# Patient Record
Sex: Female | Born: 1940 | Race: Black or African American | Hispanic: No | Smoking: Never smoker
Health system: Southern US, Community
[De-identification: ages and names within clinical notes are randomized; demographics above are authoritative.]

## PROBLEM LIST (undated history)

## (undated) DIAGNOSIS — I639 Cerebral infarction, unspecified: Secondary | ICD-10-CM

## (undated) DIAGNOSIS — E785 Hyperlipidemia, unspecified: Secondary | ICD-10-CM

## (undated) DIAGNOSIS — G43909 Migraine, unspecified, not intractable, without status migrainosus: Secondary | ICD-10-CM

## (undated) DIAGNOSIS — R519 Headache, unspecified: Secondary | ICD-10-CM

## (undated) DIAGNOSIS — F329 Major depressive disorder, single episode, unspecified: Secondary | ICD-10-CM

## (undated) DIAGNOSIS — N289 Disorder of kidney and ureter, unspecified: Secondary | ICD-10-CM

## (undated) DIAGNOSIS — E079 Disorder of thyroid, unspecified: Secondary | ICD-10-CM

## (undated) DIAGNOSIS — R51 Headache: Secondary | ICD-10-CM

## (undated) DIAGNOSIS — I10 Essential (primary) hypertension: Secondary | ICD-10-CM

## (undated) DIAGNOSIS — F32A Depression, unspecified: Secondary | ICD-10-CM

## (undated) DIAGNOSIS — R55 Syncope and collapse: Secondary | ICD-10-CM

## (undated) DIAGNOSIS — N390 Urinary tract infection, site not specified: Secondary | ICD-10-CM

## (undated) HISTORY — DX: Migraine, unspecified, not intractable, without status migrainosus: G43.909

## (undated) HISTORY — DX: Hyperlipidemia, unspecified: E78.5

## (undated) HISTORY — DX: Major depressive disorder, single episode, unspecified: F32.9

## (undated) HISTORY — DX: Urinary tract infection, site not specified: N39.0

## (undated) HISTORY — DX: Headache: R51

## (undated) HISTORY — DX: Disorder of kidney and ureter, unspecified: N28.9

## (undated) HISTORY — DX: Depression, unspecified: F32.A

## (undated) HISTORY — DX: Essential (primary) hypertension: I10

## (undated) HISTORY — DX: Cerebral infarction, unspecified: I63.9

## (undated) HISTORY — DX: Headache, unspecified: R51.9

## (undated) HISTORY — DX: Syncope and collapse: R55

---

## 2012-11-08 ENCOUNTER — Emergency Department: Payer: Self-pay | Admitting: Emergency Medicine

## 2012-11-08 LAB — COMPREHENSIVE METABOLIC PANEL
Albumin: 3.9 g/dL (ref 3.4–5.0)
Anion Gap: 4 — ABNORMAL LOW (ref 7–16)
Bilirubin,Total: 0.9 mg/dL (ref 0.2–1.0)
Chloride: 103 mmol/L (ref 98–107)
Co2: 31 mmol/L (ref 21–32)
Creatinine: 0.83 mg/dL (ref 0.60–1.30)
EGFR (Non-African Amer.): >60
Glucose: 104 mg/dL — ABNORMAL HIGH (ref 65–99)
Osmolality: 274 (ref 275–301)
SGPT (ALT): 18 U/L (ref 12–78)
Total Protein: 8.2 g/dL (ref 6.4–8.2)

## 2012-11-08 LAB — URINALYSIS, COMPLETE
Nitrite: NEGATIVE
RBC,UR: 3 /HPF (ref 0–5)
Specific Gravity: 1.021 (ref 1.003–1.030)
Squamous Epithelial: <1

## 2012-11-08 LAB — CBC WITH DIFFERENTIAL/PLATELET
Basophil #: 0.1 10*3/uL (ref 0.0–0.1)
Basophil %: 0.7 %
Eosinophil #: 0.1 10*3/uL (ref 0.0–0.7)
Eosinophil %: 1.8 %
HCT: 44.4 % (ref 35.0–47.0)
Lymphocyte %: 20.4 %
MCH: 31.4 pg (ref 26.0–34.0)
MCHC: 33.9 g/dL (ref 32.0–36.0)
Monocyte #: 0.4 x10 3/mm (ref 0.2–0.9)
Neutrophil #: 5.3 10*3/uL (ref 1.4–6.5)
Neutrophil %: 71.3 %
Platelet: 197 10*3/uL (ref 150–440)
RBC: 4.81 10*6/uL (ref 3.80–5.20)
RDW: 13.1 % (ref 11.5–14.5)

## 2013-04-05 ENCOUNTER — Ambulatory Visit: Payer: Self-pay | Admitting: Obstetrics and Gynecology

## 2013-04-05 DIAGNOSIS — I10 Essential (primary) hypertension: Secondary | ICD-10-CM

## 2013-04-05 LAB — COMPREHENSIVE METABOLIC PANEL
Alkaline Phosphatase: 64 U/L
Anion Gap: 5 — ABNORMAL LOW (ref 7–16)
Bilirubin,Total: 1.4 mg/dL — ABNORMAL HIGH (ref 0.2–1.0)
Chloride: 102 mmol/L (ref 98–107)
Co2: 30 mmol/L (ref 21–32)
Potassium: 3.6 mmol/L (ref 3.5–5.1)
Sodium: 137 mmol/L (ref 136–145)

## 2013-04-05 LAB — CBC
HCT: 44.8 % (ref 35.0–47.0)
HGB: 14.8 g/dL (ref 12.0–16.0)
MCHC: 33.1 g/dL (ref 32.0–36.0)
MCV: 94 fL (ref 80–100)
Platelet: 168 10*3/uL (ref 150–440)

## 2013-04-16 ENCOUNTER — Ambulatory Visit: Payer: Self-pay | Admitting: Obstetrics and Gynecology

## 2013-04-19 LAB — PATHOLOGY REPORT

## 2014-08-06 NOTE — Op Note (Signed)
PATIENT NAME:  Kaylee [name], Kaylee [name] MR#:  [id number] DATE OF BIRTH:  Sep 17, 1940  DATE OF PROCEDURE:  04/16/2013  PREOPERATIVE DIAGNOSES: 1.  Postmenopausal bleeding.  2.  Cervical stenosis.   POSTOPERATIVE DIAGNOSES: 1.  Postmenopausal bleeding.  2.  Cervical stenosis.   PROCEDURES: 1.  Dilation and curettage.  2.  Hysteroscopy.   ANESTHESIA:  General.  SURGEON: Prentice Docker, M.D.   ESTIMATED BLOOD LOSS: 25 mL operative fluids: 600 mL crystalloid.   COMPLICATIONS: None.    FINDINGS: 1.  Normal-appearing uterine cavity.  2.  Small fundal lesion with normal shape and contour that was rounded and only slightly protruding. This was not able to be sampled after 4 passes with the curette; the features suggestive of leiomyoma.   SPECIMENS: Endometrial curettings.   CONDITION AT END OF  PROCEDURE: Stable.   PROCEDURE IN DETAIL: The patient was taken to the operating room where general anesthesia was administered and found to be adequate. She was placed in the dorsal supine lithotomy position in [place] stirrups and prepped and draped in the usual sterile fashion. After a timeout was called, a red rubber catheter was introduced into the bladder, and her bladder was drained of approximately 50 mL of clear urine. The red rubber catheter was removed. The cervix was attempted to be visualized with a standard speculum; however, there was a band in the distal vagina which did not allow for easy opening of the speculum to gain visualization and access to the cervix; therefore, manual right angle retractors were used. After multiple attempts of trying to grasp the anterior lip of the cervix without success, due to the cervix being very flush with the vaginal wall, packing forceps was used to grasp the posterior lip of the cervix and apply traction while the single-tooth tenaculum was applied. After this was accomplished, the cervix was dilated in a serial fashion using Hegar dilators to a dilatation of 8  mm. The hysteroscope was then gently introduced through the cervix with the above-noted findings. Several passes were attempted gently with the curette to obtain a comprehensive sample of the uterine cavity. Of note, there was the fundal lesion, which appeared very well circumscribed, only minimally protruding into the cavity and was not amenable to sampling, as it was quite firm and nodular and would not be sampled. After 4 attempts, the attempt at sampling was abandoned given the risk of harm versus benefit of sampling. 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 At this point, the procedure was terminated. All instrumentation was removed from the uterus and the cervix. The cervix was visualized and hemostasis was noted. Of note, the labia minora had to be suture tied so that lateral reflection was accomplished to gain better visualization. These sutures were removed at the end of procedure.   At the very end of the procedure, the vagina was inspected and no instruments or sponges were left in the vagina.   The patient tolerated the procedure well. Sponge, lap and needle counts were correct x 2. The patient was wearing pneumatic compression stockings for VTE prophylaxis throughout the entire procedure. She was awakened in the operating room and taken to the recovery area in stable condition.     ____________________________ Will Bonnet, MD sdj:dmm D: 04/16/2013 10:09:00 ET T: 04/16/2013 10:50:01 ET JOB#: [id number]  cc: Will Bonnet, MD, <Dictator> Will Bonnet MD ELECTRONICALLY SIGNED 05/21/2013 16:54

## 2018-02-05 ENCOUNTER — Emergency Department: Payer: Medicare Other

## 2018-02-05 ENCOUNTER — Inpatient Hospital Stay
Admission: EM | Admit: 2018-02-05 | Discharge: 2018-02-07 | DRG: 066 | Disposition: A | Discharge status: Home Health | Payer: Medicare Other | Attending: Internal Medicine | Admitting: Internal Medicine

## 2018-02-05 ENCOUNTER — Other Ambulatory Visit: Payer: Self-pay

## 2018-02-05 DIAGNOSIS — I6381 Other cerebral infarction due to occlusion or stenosis of small artery: Secondary | ICD-10-CM | POA: Diagnosis not present

## 2018-02-05 DIAGNOSIS — E039 Hypothyroidism, unspecified: Secondary | ICD-10-CM | POA: Diagnosis not present

## 2018-02-05 DIAGNOSIS — E785 Hyperlipidemia, unspecified: Secondary | ICD-10-CM | POA: Diagnosis present

## 2018-02-05 DIAGNOSIS — I10 Essential (primary) hypertension: Secondary | ICD-10-CM | POA: Diagnosis not present

## 2018-02-05 DIAGNOSIS — D696 Thrombocytopenia, unspecified: Secondary | ICD-10-CM | POA: Diagnosis present

## 2018-02-05 DIAGNOSIS — R930 Abnormal findings on diagnostic imaging of skull and head, not elsewhere classified: Secondary | ICD-10-CM | POA: Diagnosis not present

## 2018-02-05 DIAGNOSIS — I639 Cerebral infarction, unspecified: Secondary | ICD-10-CM

## 2018-02-05 DIAGNOSIS — M25552 Pain in left hip: Secondary | ICD-10-CM | POA: Diagnosis not present

## 2018-02-05 DIAGNOSIS — W19XXXA Unspecified fall, initial encounter: Secondary | ICD-10-CM

## 2018-02-05 DIAGNOSIS — S79912A Unspecified injury of left hip, initial encounter: Secondary | ICD-10-CM | POA: Diagnosis not present

## 2018-02-05 DIAGNOSIS — R9389 Abnormal findings on diagnostic imaging of other specified body structures: Secondary | ICD-10-CM

## 2018-02-05 DIAGNOSIS — S0990XA Unspecified injury of head, initial encounter: Secondary | ICD-10-CM | POA: Diagnosis not present

## 2018-02-05 DIAGNOSIS — R42 Dizziness and giddiness: Secondary | ICD-10-CM | POA: Diagnosis not present

## 2018-02-05 DIAGNOSIS — R55 Syncope and collapse: Secondary | ICD-10-CM | POA: Diagnosis not present

## 2018-02-05 DIAGNOSIS — R4182 Altered mental status, unspecified: Secondary | ICD-10-CM | POA: Diagnosis not present

## 2018-02-05 DIAGNOSIS — R29700 NIHSS score 0: Secondary | ICD-10-CM | POA: Diagnosis not present

## 2018-02-05 HISTORY — DX: Disorder of thyroid, unspecified: E07.9

## 2018-02-05 LAB — BASIC METABOLIC PANEL
Anion gap: 8 (ref 5–15)
BUN: 19 mg/dL (ref 8–23)
CALCIUM: 9.3 mg/dL (ref 8.9–10.3)
CO2: 26 mmol/L (ref 22–32)
CREATININE: 1 mg/dL (ref 0.44–1.00)
Chloride: 105 mmol/L (ref 98–111)
GFR calc non Af Amer: 52 mL/min — ABNORMAL LOW (ref >60)
Glucose, Bld: 146 mg/dL — ABNORMAL HIGH (ref 70–99)
Potassium: 4 mmol/L (ref 3.5–5.1)
SODIUM: 139 mmol/L (ref 135–145)

## 2018-02-05 LAB — URINALYSIS, COMPLETE (UACMP) WITH MICROSCOPIC
BILIRUBIN URINE: NEGATIVE
Bacteria, UA: NONE SEEN
Glucose, UA: NEGATIVE mg/dL
Ketones, ur: 20 mg/dL — AB
LEUKOCYTES UA: NEGATIVE
Nitrite: NEGATIVE
PH: 7 (ref 5.0–8.0)
Protein, ur: NEGATIVE mg/dL
SPECIFIC GRAVITY, URINE: 1.015 (ref 1.005–1.030)

## 2018-02-05 LAB — TSH: TSH: 5.077 u[IU]/mL — ABNORMAL HIGH (ref 0.350–4.500)

## 2018-02-05 LAB — CBC
HCT: 41.4 % (ref 36.0–46.0)
Hemoglobin: 13.6 g/dL (ref 12.0–15.0)
MCH: 31.5 pg (ref 26.0–34.0)
MCHC: 32.9 g/dL (ref 30.0–36.0)
MCV: 95.8 fL (ref 80.0–100.0)
NRBC: 0 % (ref 0.0–0.2)
PLATELETS: 117 10*3/uL — AB (ref 150–400)
RBC: 4.32 MIL/uL (ref 3.87–5.11)
RDW: 12.4 % (ref 11.5–15.5)
WBC: 6.7 10*3/uL (ref 4.0–10.5)

## 2018-02-05 LAB — TROPONIN I: Troponin I: <0.03 ng/mL (ref <0.03)

## 2018-02-05 MED ORDER — ENOXAPARIN SODIUM 40 MG/0.4ML ~~LOC~~ SOLN
40.0000 mg | SUBCUTANEOUS | Status: DC
  Administered 2018-02-05 – 2018-02-06 (×2): 40 mg via SUBCUTANEOUS
  Filled 2018-02-05 (×2): qty 0.4

## 2018-02-05 MED ORDER — ONDANSETRON HCL 4 MG/2ML IJ SOLN: 4.0000 mg | Freq: Four times a day (QID) | INTRAMUSCULAR | Status: DC | PRN

## 2018-02-05 MED ORDER — ADULT MULTIVITAMIN W/MINERALS CH
1.0000 | ORAL_TABLET | ORAL | Status: DC
  Administered 2018-02-05: 1 via ORAL
  Filled 2018-02-05: qty 1

## 2018-02-05 MED ORDER — ONDANSETRON HCL 4 MG PO TABS: 4.0000 mg | ORAL_TABLET | Freq: Four times a day (QID) | ORAL | Status: DC | PRN

## 2018-02-05 MED ORDER — HYDRALAZINE HCL 20 MG/ML IJ SOLN: 10.0000 mg | Freq: Four times a day (QID) | INTRAMUSCULAR | Status: DC | PRN

## 2018-02-05 MED ORDER — ACETAMINOPHEN 650 MG RE SUPP: 650.0000 mg | Freq: Four times a day (QID) | RECTAL | Status: DC | PRN

## 2018-02-05 MED ORDER — ASPIRIN EC 81 MG PO TBEC
81.0000 mg | DELAYED_RELEASE_TABLET | Freq: Every day | ORAL | Status: DC
  Administered 2018-02-05 – 2018-02-07 (×3): 81 mg via ORAL
  Filled 2018-02-05 (×3): qty 1

## 2018-02-05 MED ORDER — STROKE: EARLY STAGES OF RECOVERY BOOK
Freq: Once | Status: AC
  Administered 2018-02-05: 21:00:00

## 2018-02-05 MED ORDER — ACETAMINOPHEN 325 MG PO TABS: 650.0000 mg | ORAL_TABLET | Freq: Four times a day (QID) | ORAL | Status: DC | PRN

## 2018-02-05 NOTE — H&P (Signed)
[facility] [place] NAME: Haeleigh Streiff    MR#:  [id number]  DATE OF BIRTH:  November 11, 1940  DATE OF ADMISSION:  02/05/2018  PRIMARY CARE PHYSICIAN: Patient, No Pcp Per   REQUESTING/REFERRING PHYSICIAN: Dr. Charlotte Crumb  CHIEF COMPLAINT:   Chief Complaint  Patient presents with  . Loss of Consciousness    HISTORY OF PRESENT ILLNESS:  Ileanna Gemmill  is a 77 y.o. female with a known history of hypothyroidism, hypertension but has not seen a doctor in a while and currently on no medications who presents to the hospital due to syncope.  Patient herself cannot recall the events to for most of the history obtained from the family at bedside.  As per the family patient was in her usual state of health and was eating lunch just fine and when she got up after finishing her lunch she fell backwards with her eyes rolling backwards and hit her head.  She was only out for a few seconds and then was arousable and was talking.  Patient had no vigorous shaking or any incontinence.  She has no previous history of seizures.  Patient cannot recall any of the events and therefore was brought to the hospital for further evaluation.  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  Hospitalist services were contacted for admission.  Patient presently denies any chest pains, shortness of breath, nausea, vomiting, numbness, tingling or any focal weakness.  PAST MEDICAL HISTORY:   Past Medical History:  Diagnosis Date  . Thyroid disease     PAST SURGICAL HISTORY:  The histories are not reviewed yet. Please review them in the "History" navigator section and refresh this [place].  SOCIAL HISTORY:   Social History   Tobacco Use  . Smoking status: Never Smoker  . Smokeless tobacco: Never Used  Substance Use Topics  . Alcohol use: Not  Currently    FAMILY HISTORY:   Family History  Problem Relation Age of Onset  . Dementia Mother   . Hypertension Mother   . Hypothyroidism Mother   . Prostate cancer Father     DRUG ALLERGIES:  No Known Allergies  REVIEW OF SYSTEMS:   Review of Systems  Constitutional: Negative for fever and weight loss.  HENT: Negative for congestion, nosebleeds and tinnitus.   Eyes: Negative for blurred vision, double vision and redness.  Respiratory: Negative for cough, hemoptysis and shortness of breath.   Cardiovascular: Negative for chest pain, orthopnea, leg swelling and PND.       Syncope   Gastrointestinal: Negative for abdominal pain, diarrhea, melena, nausea and vomiting.  Genitourinary: Negative for dysuria, hematuria and urgency.  Musculoskeletal: Negative for falls and joint pain.  Neurological: Negative for dizziness, tingling, sensory change, focal weakness, seizures, weakness and headaches.  Endo/Heme/Allergies: Negative for polydipsia. Does not bruise/bleed easily.  Psychiatric/Behavioral: Negative for depression and memory loss. The patient is not nervous/anxious.     MEDICATIONS AT HOME:   Prior to Admission medications   Medication Sig Start Date End Date Taking? Authorizing Provider  Multiple Vitamin (MULTIVITAMIN) tablet Take 1 tablet by mouth once a week.   Yes [provider]      VITAL SIGNS:  Blood pressure (!) 165/88, pulse 68, temperature 97.9 F (36.6 C), temperature source Oral, resp. rate 17, height 5\' 5"  (1.651 m),  weight 61.6 kg, SpO2 100 %.  PHYSICAL EXAMINATION:  Physical Exam  GENERAL:  77 y.o.-year-old patient lying in the bed in no acute distress.  EYES: Pupils equal, round, reactive to light and accommodation. No scleral icterus. Extraocular muscles intact.  HEENT: Head atraumatic, normocephalic. Oropharynx and nasopharynx clear. No oropharyngeal erythema, moist oral mucosa  NECK:  Supple, no jugular venous distention. No thyroid  enlargement, no tenderness.  LUNGS: Normal breath sounds bilaterally, no wheezing, rales, rhonchi. No use of accessory muscles of respiration.  CARDIOVASCULAR: S1, S2 RRR. No murmurs, rubs, gallops, clicks.  ABDOMEN: Soft, nontender, nondistended. Bowel sounds present. No organomegaly or mass.  EXTREMITIES: No pedal edema, cyanosis, or clubbing. + 2 pedal & radial pulses b/l.   NEUROLOGIC: Cranial nerves II through XII are intact. No focal Motor or sensory deficits appreciated b/l PSYCHIATRIC: The patient is alert and oriented x 3.  SKIN: No obvious rash, lesion, or ulcer.   LABORATORY PANEL:   CBC Recent Labs  Lab 02/05/18 1412  WBC 6.7  HGB 13.6  HCT 41.4  PLT 117*   ------------------------------------------------------------------------------------------------------------------  Chemistries  Recent Labs  Lab 02/05/18 1412  NA 139  K 4.0  CL 105  CO2 26  GLUCOSE 146*  BUN 19  CREATININE 1.00  CALCIUM 9.3   ------------------------------------------------------------------------------------------------------------------  Cardiac Enzymes Recent Labs  Lab 02/05/18 1421  TROPONINI <0.03   ------------------------------------------------------------------------------------------------------------------  RADIOLOGY:  Ct Head Wo Contrast  Result Date: 02/05/2018 CLINICAL DATA:  Patient passed out and hit back of head.  No pain. EXAM: CT HEAD WITHOUT CONTRAST TECHNIQUE: Contiguous axial images were obtained from the base of the skull through the vertex without intravenous contrast. COMPARISON:  None. FINDINGS: Brain: No subdural, epidural, or subarachnoid hemorrhage. There is a lacunar infarct in the left cerebellar hemisphere. Cerebellum, brainstem, and basal cisterns are otherwise normal. Ventricles and sulci are prominent, consistent with volume loss. White matter changes noted. An infarct in the left frontal lobe is suspected to be nonacute. No other evidence of acute  ischemia infarct. No mass effect or midline shift. Vascular: No hyperdense vessel or unexpected calcification. Skull: Normal. Negative for fracture or focal lesion. Sinuses/Orbits: No acute finding. Other: None. IMPRESSION: 1. 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 Chronic left cerebellar hemisphere lacunar infarct. No other evidence of acute abnormality. Electronically Signed   By: Dorise Bullion III M.D   On: 02/05/2018 16:20   Dg Hip Unilat W Or Wo Pelvis 2-3 Views Left  Result Date: 02/05/2018 CLINICAL DATA:  Acute LEFT hip pain following fall today. Initial encounter. EXAM: DG HIP (WITH OR WITHOUT PELVIS) 2-3V LEFT COMPARISON:  11/08/2012 pelvic CT scout film FINDINGS: No acute fracture or dislocation. No focal bony lesions are identified. The joint spaces are unremarkable. Degenerative changes in the LOWER lumbar spine are noted. IMPRESSION: No acute bony abnormality. Electronically Signed   By: Margarette Canada M.D.   On: 02/05/2018 16:39     IMPRESSION AND PLAN:   77 year old female with past medical history of hypothyroidism, hypertension but currently on no medications and has not seen a physician in a few years who presents to the hospital after a syncopal episode and noted to have CT scan findings suggestive of a acute/subacute CVA.  1.  Acute/subacute CVA- this was noted on a CT head on admission.  Patient initially presented with a syncopal episode and fall. - Patient has no previous history of CVA.  She has no focal neurological deficits. - We will start on  baby aspirin, check a lipid profile.  Will get MRI of the brain, check carotid duplex, echocardiogram.  We will also get a neurology consult. - We will get physical therapy and speech evaluation.    2.  Syncope- etiology unclear.  Will observe on telemetry, await echocardiogram and carotid duplex results.  Will check orthostatic vital signs.  3.  Essential hypertension-patient currently on no medications. - We will  place on IV hydralazine as needed for now.  4.  Hypothyroidism-we will check a TSH.  Patient currently on no meds.    All the records are reviewed and case discussed with ED provider. Management plans discussed with the patient, family and they are in agreement.  CODE STATUS: Full code  TOTAL TIME TAKING CARE OF THIS PATIENT: 45 minutes.    Henreitta Leber M.D on 02/05/2018 at 5:56 PM  Between 7am to 6pm - Pager - [phone]  After 6pm go to [email or web address] - password [email or web address] [facility]  Office  [phone]  CC: Primary care physician; Patient, No Pcp Per

## 2018-02-05 NOTE — ED Provider Notes (Addendum)
[facility] Emergency Department Provider Note  ____________________________________________   I have reviewed the triage vital signs and the nursing notes. Where available I have reviewed prior notes and, if possible and indicated, outside hospital notes.    HISTORY  Chief Complaint Loss of Consciousness    HPI Kaylee [name] is a 77 y.o. female  presents today complaining of nothing but was noted to have had a fall. Family states she got up from lunch very rapidly, got lightheaded, stumbled and then fell bumping her head. She did not pass out there was no seizure activity, no incontinence of bowel or bladder, patient states "I just fell". She denies any fever or chills or antecedent chest pain or shortness of breath or other concerns. She states she feels "pretty good". Family are concerned that she may have for her hip, patient states that she did not. level V chart caveat, limited H&P given patient dementia    Past Medical History:  Diagnosis Date  . Thyroid disease     There are no active problems to display for this patient.     Prior to Admission medications   Not on File    Allergies Patient has no known allergies.  No family history on file.  Social History Social History   Tobacco Use  . Smoking status: Never Smoker  . Smokeless tobacco: Never Used  Substance Use Topics  . Alcohol use: Not Currently  . Drug use: Not Currently    Review of Systems Constitutional: No fever/chills Eyes: No visual changes. ENT: No sore throat. No stiff neck no neck pain Cardiovascular: Denies chest pain. Respiratory: Denies shortness of breath. Gastrointestinal:   no vomiting.  No diarrhea.  No constipation. Genitourinary: Negative for dysuria. Musculoskeletal: Negative lower extremity swelling Skin: Negative for rash. Neurological: Negative for severe headaches, focal weakness or  numbness.   ____________________________________________   PHYSICAL EXAM:  VITAL SIGNS: ED Triage Vitals  Enc Vitals Group     BP 02/05/18 1407 (!) 165/88     Pulse Rate 02/05/18 1407 68     Resp 02/05/18 1407 17     Temp 02/05/18 1407 97.9 F (36.6 C)     Temp Source 02/05/18 1407 Oral     SpO2 02/05/18 1412 100 %     Weight 02/05/18 1409 135 lb 12.9 oz (61.6 kg)     Height 02/05/18 1409 5\' 5"  (1.651 m)     Head Circumference --      Peak Flow --      Pain Score 02/05/18 1409 0     Pain Loc --      Pain Edu? --      Excl. in [place]? --     Constitutional: Alert and orientedto name unsure of the date answers questions appropriately is intermittently compliant with exam. Well appearing and in no acute distress. Eyes: Conjunctivae are normal Head: Atraumatic HEENT: No congestion/rhinnorhea. Mucous membranes are moist.  Oropharynx non-erythematous Neck:   Nontender with no meningismus, no masses, no stridor Cardiovascular: Normal rate, regular rhythm. Grossly normal heart sounds.  Good peripheral circulation. Respiratory: Normal respiratory effort.  No retractions. Lungs CTAB. Abdominal: Soft and nontender. No distention. No guarding no rebound Back:  There is no focal tenderness or step off.  there is no midline tenderness there are no lesions noted. there is no CVA tenderness Musculoskeletal: slight tenderness to the left hip., no upper extremity tenderness. No joint effusions, no DVT signs strong distal pulses no edema Neurologic:  Normal speech and language. No gross focal neurologic deficits are appreciated.  Skin:  Skin is warm, dry and intact. No rash noted. Psychiatric: Mood and affect are normal. Speech and behavior are normal.  ____________________________________________   LABS (all labs ordered are listed, but only abnormal results are displayed)  Labs Reviewed  BASIC METABOLIC PANEL - Abnormal; Notable for the following components:      Result Value   Glucose,  Bld 146 (*)    GFR calc non Af Amer 52 (*)    All other components within normal limits  CBC - Abnormal; Notable for the following components:   Platelets 117 (*)    All other components within normal limits  URINALYSIS, COMPLETE (UACMP) WITH MICROSCOPIC - Abnormal; Notable for the following components:   Color, Urine YELLOW (*)    APPearance CLEAR (*)    Hgb urine dipstick SMALL (*)    Ketones, ur 20 (*)    All other components within normal limits  CBG MONITORING, ED    Pertinent labs  results that were available during my care of the patient were reviewed by me and considered in my medical decision making (see chart for details). ____________________________________________  EKG  I personally interpreted any EKGs ordered by me or triage sinus rhythm at 70 bpm no acute ST elevation or depression normal axis and remarkable EKG ____________________________________________  RADIOLOGY  Pertinent labs & imaging results that were available during my care of the patient were reviewed by me and considered in my medical decision making (see chart for details). If possible, patient and/or family made aware of any abnormal findings.  No results found. ____________________________________________    PROCEDURES  Procedure(s) performed: None  Procedures  Critical Care performed: None  ____________________________________________   INITIAL IMPRESSION / ASSESSMENT AND PLAN / ED COURSE  Pertinent labs & imaging results that were available during my care of the patient were reviewed by me and considered in my medical decision making (see chart for details).  seen here after a stumble and fall event, she has no complaints.patient is in no acute distress, has no evidence of acute toxidrome,no evidence of ACS PE dissection or seizure. her blood work is reassuring there was no syncope, we will obtain CT head andreassess closely. Patient in no acute distress. There is some concern about  her hip which we will x-ray.  ----------------------------------------- 4:59 PM on 02/05/2018 -----------------------------------------  I personally evaluated the patient's CT scan and discussed it with Dr. Toney Reil, the neuroradiologist, who feels that these  or subacute CVAs, most likely happening within the last week. We will admit the patient for further evaluation.   ____________________________________________   FINAL CLINICAL IMPRESSION(S) / ED DIAGNOSES  Final diagnoses:  None      This chart was dictated using voice recognition software.  Despite best efforts to proofread,  errors can occur which can change meaning.      Schuyler Amor, MD 02/05/18 1608    Schuyler Amor, MD 02/05/18 1700

## 2018-02-05 NOTE — ED Triage Notes (Signed)
Pt comes into the ED via EMS from home with c/o pt standing from a sitting position and passing out, falling back and hitting the back of her head. Pt denies any pain. Pt has some confusion and alert to self only.

## 2018-02-06 ENCOUNTER — Observation Stay (HOSPITAL_COMMUNITY)
Admit: 2018-02-06 | Discharge: 2018-02-06 | Disposition: A | Discharge status: Home / Self Care | Payer: Medicare Other | Attending: Specialist | Admitting: Specialist

## 2018-02-06 ENCOUNTER — Observation Stay: Payer: Medicare Other

## 2018-02-06 ENCOUNTER — Other Ambulatory Visit: Payer: Self-pay | Admitting: Nurse Practitioner

## 2018-02-06 ENCOUNTER — Encounter: Payer: Self-pay | Admitting: Nurse Practitioner

## 2018-02-06 DIAGNOSIS — R4182 Altered mental status, unspecified: Secondary | ICD-10-CM | POA: Diagnosis not present

## 2018-02-06 DIAGNOSIS — I63423 Cerebral infarction due to embolism of bilateral anterior cerebral arteries: Secondary | ICD-10-CM | POA: Diagnosis not present

## 2018-02-06 DIAGNOSIS — R29700 NIHSS score 0: Secondary | ICD-10-CM | POA: Diagnosis present

## 2018-02-06 DIAGNOSIS — I5030 Unspecified diastolic (congestive) heart failure: Secondary | ICD-10-CM

## 2018-02-06 DIAGNOSIS — I10 Essential (primary) hypertension: Secondary | ICD-10-CM | POA: Diagnosis not present

## 2018-02-06 DIAGNOSIS — D696 Thrombocytopenia, unspecified: Secondary | ICD-10-CM | POA: Diagnosis present

## 2018-02-06 DIAGNOSIS — R55 Syncope and collapse: Secondary | ICD-10-CM | POA: Diagnosis not present

## 2018-02-06 DIAGNOSIS — E785 Hyperlipidemia, unspecified: Secondary | ICD-10-CM | POA: Diagnosis present

## 2018-02-06 DIAGNOSIS — I63232 Cerebral infarction due to unspecified occlusion or stenosis of left carotid arteries: Secondary | ICD-10-CM | POA: Diagnosis not present

## 2018-02-06 DIAGNOSIS — I6381 Other cerebral infarction due to occlusion or stenosis of small artery: Secondary | ICD-10-CM | POA: Diagnosis present

## 2018-02-06 DIAGNOSIS — E039 Hypothyroidism, unspecified: Secondary | ICD-10-CM | POA: Diagnosis present

## 2018-02-06 DIAGNOSIS — I639 Cerebral infarction, unspecified: Secondary | ICD-10-CM | POA: Diagnosis not present

## 2018-02-06 LAB — HEMOGLOBIN A1C
HEMOGLOBIN A1C: 5.2 % (ref 4.8–5.6)
Mean Plasma Glucose: 102.54 mg/dL

## 2018-02-06 LAB — ECHOCARDIOGRAM COMPLETE
Height: 65 in
WEIGHTICAEL: 2195.2 [oz_av]

## 2018-02-06 LAB — LIPID PANEL
Cholesterol: 173 mg/dL (ref 0–200)
HDL: 63 mg/dL (ref >40)
LDL CALC: 103 mg/dL — AB (ref 0–99)
Total CHOL/HDL Ratio: 2.7 RATIO
Triglycerides: 33 mg/dL (ref <150)
VLDL: 7 mg/dL (ref 0–40)

## 2018-02-06 MED ORDER — ATORVASTATIN CALCIUM 20 MG PO TABS
40.0000 mg | ORAL_TABLET | Freq: Every day | ORAL | Status: DC
  Administered 2018-02-06: 18:00:00 40 mg via ORAL
  Filled 2018-02-06: qty 2

## 2018-02-06 MED ORDER — LEVETIRACETAM 500 MG PO TABS
500.0000 mg | ORAL_TABLET | Freq: Two times a day (BID) | ORAL | Status: DC
  Filled 2018-02-06 (×2): qty 1

## 2018-02-06 MED ORDER — LORAZEPAM 2 MG/ML IJ SOLN
1.0000 mg | Freq: Once | INTRAMUSCULAR | Status: AC
  Administered 2018-02-06: 11:00:00 1 mg via INTRAVENOUS
  Filled 2018-02-06 (×2): qty 1

## 2018-02-06 NOTE — Progress Notes (Signed)
Advanced care plan.  Purpose of the Encounter: CODE STATUS  Parties in Attendance: Patient herself  Patient's Decision Capacity: Intact  Subjective/Patient's story: Patient 77 year old admitted with syncope and a stroke   Objective/Medical story  I discussed with the patient regarding her desires for cardiac and pulmonary resuscitation.  As well as her wishes for future interventions  Goals of care determination:  She wishes to be a full code   CODE STATUS: Full code   Time spent discussing advanced care planning: 16 minutes

## 2018-02-06 NOTE — Consult Note (Addendum)
Referring Physician: Henreitta Leber, MD    Chief Complaint: Loss of consciousness  HPI: Kaylee [name] is an 77 y.o. female with history of syncopal episode, hypertension, and thyroid dysfunction presenting to the ED with syncopal episode. Per patient's son who is at the bedside and provides history, patient was having lunch with her sister around 12-1 pm yesterday when the episode occurred.  Patient's son state that, patient stood up from the table then suddenly felt lightheaded and was stumbling around prior to falling on the floor with brief loss of consciousness.  Patient's son reports that patient eyes were rolling backwards without notable myoclonic jerking movement.  Patient denied associated symptoms preceding the event of nausea and vomiting, feeling cold or clammy, visual auras or blurry vision, palpitations shortness of breath or chest pain.  Following the event she was confused but without Injury or loss of bladder or bowel incontinence noted.  Patient states she does not recall the events following the fall, all she can remember was feeling lightheaded and falling on the floor.  Patient's son states that she has not been taking her medication for high blood pressure and thyroid dysfunction. Patient was also not on any antiplatelet or anticoagulation prior to this episode.  Initial CT head showed left frontal lobe infarct.  Initial NIH stroke scale was 0.  Date last known well: Date: 02/05/2018 Time last known well: Time: 12:00 tPA Given: No: NIH stroke scale of 0  Past Medical History:  Diagnosis Date  . Thyroid disease     Family History  Problem Relation Age of Onset  . Dementia Mother   . Hypertension Mother   . Hypothyroidism Mother   . Prostate cancer Father    Social History:  reports that she has never smoked. She has never used smokeless tobacco. She reports that she drank alcohol. She reports that she has current or past drug history.  Allergies: No Known  Allergies  Medications:  I have reviewed the patient's current medications. Prior to Admission:  Medications Prior to Admission  Medication Sig Dispense Refill Last Dose  . Multiple Vitamin (MULTIVITAMIN) tablet Take 1 tablet by mouth once a week.   Past Week at 0800   Scheduled: . aspirin EC  81 mg Oral Daily  . atorvastatin  40 mg Oral q1800  . enoxaparin (LOVENOX) injection  40 mg Subcutaneous Q24H  . multivitamin with minerals  1 tablet Oral Weekly    ROS: History obtained from the patient   General ROS: negative for - chills, fatigue, fever, night sweats, weight gain or weight loss Psychological ROS: negative for - behavioral disorder, hallucinations, memory difficulties, mood swings or suicidal ideation Ophthalmic ROS: negative for - blurry vision, double vision, eye pain or loss of vision ENT ROS: negative for - epistaxis, nasal discharge, oral lesions, sore throat, tinnitus or vertigo Allergy and Immunology ROS: negative for - hives or itchy/watery eyes Hematological and Lymphatic ROS: negative for - bleeding problems, bruising or swollen lymph nodes Endocrine ROS: negative for - galactorrhea, hair pattern changes, polydipsia/polyuria or temperature intolerance Respiratory ROS: negative for - cough, hemoptysis, shortness of breath or wheezing Cardiovascular ROS: negative for - chest pain, dyspnea on exertion, edema or irregular heartbeat Gastrointestinal ROS: negative for - abdominal pain, diarrhea, hematemesis, nausea/vomiting or stool incontinence Genito-Urinary ROS: negative for - dysuria, hematuria, incontinence or urinary frequency/urgency Musculoskeletal ROS: negative for - joint swelling or muscular weakness Neurological ROS: as noted in HPI Dermatological ROS: negative for rash and skin lesion  changes  Physical Examination: Blood pressure (!) 146/86, pulse 68, temperature 98.8 F (37.1 C), temperature source Oral, resp. rate 16, height 5\' 5"  (1.651 m), weight 62.2  kg, SpO2 98 %.   HEENT-  Normocephalic, no lesions, without obvious abnormality.  Normal external eye and conjunctiva.  Normal TM's bilaterally.  Normal auditory canals and external ears. Normal external nose, mucus membranes and septum.  Normal pharynx. Cardiovascular- S1, S2 normal, pulses palpable throughout   Lungs- chest clear, no wheezing, rales, normal symmetric air entry Abdomen- soft, non-tender; bowel sounds normal; no masses,  no organomegaly Extremities- no edema Lymph-no adenopathy palpable Musculoskeletal-no joint tenderness, deformity or swelling Skin-warm and dry, no hyperpigmentation, vitiligo, or suspicious lesions  Neurological Exam   Mental Status: Alert, oriented, thought content appropriate.  Speech fluent without evidence of aphasia.  Able to follow 3 step commands without difficulty. Attention span and concentration seemed appropriate  Cranial Nerves: II: Discs flat bilaterally; Visual fields grossly normal, pupils equal, round, reactive to light and accommodation III,IV, VI: ptosis not present, extra-ocular motions intact bilaterally V,VII: smile symmetric, facial light touch sensation intact VIII: hearing normal bilaterally IX,X: gag reflex present XI: bilateral shoulder shrug XII: midline tongue extension Motor: Right :  Upper extremity   5/5 Without pronator drift      Left: Upper extremity   5/5 without pronator drift Right:   Lower extremity   5/5                                          Left: Lower extremity   5/5 Tone and bulk:normal tone throughout; no atrophy noted Sensory: Pinprick and light touch intact bilaterally Deep Tendon Reflexes: 2+ and symmetric throughout Plantars: Right: mute                              Left: mute Cerebellar: Finger-to-nose testing intact bilaterally. Heel to shin testing normal bilaterally Gait: not tested due to safety concerns  Data Reviewed  Laboratory Studies:  Basic Metabolic Panel: Recent Labs  Lab  02/05/18 1412  NA 139  K 4.0  CL 105  CO2 26  GLUCOSE 146*  BUN 19  CREATININE 1.00  CALCIUM 9.3    Liver Function Tests: No results for input(s): AST, ALT, ALKPHOS, BILITOT, PROT, ALBUMIN in the last 168 hours. No results for input(s): LIPASE, AMYLASE in the last 168 hours. No results for input(s): AMMONIA in the last 168 hours.  CBC: Recent Labs  Lab 02/05/18 1412  WBC 6.7  HGB 13.6  HCT 41.4  MCV 95.8  PLT 117*    Cardiac Enzymes: Recent Labs  Lab 02/05/18 1421  TROPONINI <0.03    BNP: Invalid input(s): POCBNP  CBG: No results for input(s): GLUCAP in the last 168 hours.  Microbiology: No results found for this or any previous visit.  Coagulation Studies: No results for input(s): LABPROT, INR in the last 72 hours.  Urinalysis:  Recent Labs  Lab 02/05/18 1515  COLORURINE YELLOW*  LABSPEC 1.015  PHURINE 7.0  GLUCOSEU NEGATIVE  HGBUR SMALL*  BILIRUBINUR NEGATIVE  KETONESUR 20*  PROTEINUR NEGATIVE  NITRITE NEGATIVE  LEUKOCYTESUR NEGATIVE    Lipid Panel:    Component Value Date/Time   CHOL 173 02/06/2018 0505   TRIG 33 02/06/2018 0505   HDL 63 02/06/2018 0505   CHOLHDL 2.7 02/06/2018 0505  VLDL 7 02/06/2018 0505   LDLCALC 103 (H) 02/06/2018 0505    HgbA1C:  Lab Results  Component Value Date   HGBA1C 5.2 02/06/2018    Urine Drug Screen:  No results found for: LABOPIA, COCAINSCRNUR, LABBENZ, AMPHETMU, THCU, LABBARB  Alcohol Level: No results for input(s): ETH in the last 168 hours.  Other results: EKG: normal EKG, normal sinus rhythm, unchanged from previous tracings.  Imaging: Ct Head Wo Contrast  Result Date: 02/05/2018 CLINICAL DATA:  Patient passed out and hit back of head.  No pain. EXAM: CT HEAD WITHOUT CONTRAST TECHNIQUE: Contiguous axial images were obtained from the base of the skull through the vertex without intravenous contrast. COMPARISON:  None. FINDINGS: Brain: No subdural, epidural, or subarachnoid hemorrhage. There  is a lacunar infarct in the left cerebellar hemisphere. Cerebellum, brainstem, and basal cisterns are otherwise normal. Ventricles and sulci are prominent, consistent with volume loss. White matter changes noted. An infarct in the left frontal lobe is suspected to be nonacute. No other evidence of acute ischemia infarct. No mass effect or midline shift. Vascular: No hyperdense vessel or unexpected calcification. Skull: Normal. Negative for fracture or focal lesion. Sinuses/Orbits: No acute finding. Other: None. IMPRESSION: 1. 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 Chronic left cerebellar hemisphere lacunar infarct. No other evidence of acute abnormality. Electronically Signed   By: Dorise Bullion III M.D   On: 02/05/2018 16:20   [name] Head Wo Contrast  Result Date: 02/06/2018 CLINICAL DATA:  Syncope. EXAM: MRI HEAD WITHOUT CONTRAST MRA HEAD WITHOUT CONTRAST TECHNIQUE: Multiplanar, multiecho pulse sequences of the brain and surrounding structures were obtained without intravenous contrast. Angiographic images of the head were obtained using MRA technique without contrast. COMPARISON:  Head CT 02/05/2018 FINDINGS: MRI HEAD FINDINGS The study is mildly motion degraded. Brain: There punctate acute infarcts in the left middle frontal gyrus and both occipital lobes. Chronic infarcts are noted in the left frontal lobe and right parietal lobe with a small amount of chronic blood products. Chronic infarcts are present in both cerebellar hemispheres. Patchy cerebral white matter T2 hyperintensities separate from the aforementioned chronic infarcts are nonspecific but compatible with moderate chronic small vessel ischemic disease. There is mild cerebral atrophy. No mass, midline shift, or extra-axial fluid collection is identified. 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 Vascular: Major intracranial vascular flow voids  are preserved. Skull and upper cervical spine: Unremarkable bone marrow signal. Sinuses/Orbits: Unremarkable orbits. Paranasal sinuses and mastoid air cells are clear. Other: None. MRA HEAD FINDINGS The study is moderately motion degraded. The visualized distal vertebral arteries are patent to the basilar and codominant. The basilar artery is patent without evidence of significant stenosis allowing for motion artifact proximally. Patent SCA origins are identified bilaterally. There is a small right posterior communicating artery. The PCAs are patent with asymmetric attenuation of distal branch vessels on the left but no evidence of significant proximal stenosis. The internal carotid arteries are patent from skull base to carotid termini with evaluation for stenosis limited by motion artifact. ACAs and MCAs are patent without evidence of significant A1 or M1 stenosis. Branch vessel evaluation is limited by motion artifact, however no proximal branch occlusion is identified. No aneurysm is identified within limitations of motion. IMPRESSION: 1. Punctate acute left frontal and bilateral occipital infarcts. 2. Chronic left frontal, right parietal, and bilateral cerebellar infarcts. 3. Moderate chronic small vessel ischemic disease. 4. Motion degraded  head MRA without major branch occlusion. Electronically Signed   By: Logan Bores M.D.   On: 02/06/2018 12:37   Mr Brain Wo Contrast  Result Date: 02/06/2018 CLINICAL DATA:  Syncope. EXAM: MRI HEAD WITHOUT CONTRAST MRA HEAD WITHOUT CONTRAST TECHNIQUE: Multiplanar, multiecho pulse sequences of the brain and surrounding structures were obtained without intravenous contrast. Angiographic images of the head were obtained using MRA technique without contrast. COMPARISON:  Head CT 02/05/2018 FINDINGS: MRI HEAD FINDINGS The study is mildly motion degraded. Brain: There punctate acute infarcts in the left middle frontal gyrus and both occipital lobes. Chronic infarcts are  noted in the left frontal lobe and right parietal lobe with a small amount of chronic blood products. Chronic infarcts are present in both cerebellar hemispheres. Patchy cerebral white matter T2 hyperintensities separate from the aforementioned chronic infarcts are nonspecific but compatible with moderate chronic small vessel ischemic disease. There is mild cerebral atrophy. No mass, midline shift, or extra-axial fluid collection is identified. 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 Vascular: Major intracranial vascular flow voids are preserved. Skull and upper cervical spine: Unremarkable bone marrow signal. Sinuses/Orbits: Unremarkable orbits. Paranasal sinuses and mastoid air cells are clear. Other: None. MRA HEAD FINDINGS The study is moderately motion degraded. The visualized distal vertebral arteries are patent to the basilar and codominant. The basilar artery is patent without evidence of significant stenosis allowing for motion artifact proximally. Patent SCA origins are identified bilaterally. There is a small right posterior communicating artery. The PCAs are patent with asymmetric attenuation of distal branch vessels on the left but no evidence of significant proximal stenosis. The internal carotid arteries are patent from skull base to carotid termini with evaluation for stenosis limited by motion artifact. ACAs and MCAs are patent without evidence of significant A1 or M1 stenosis. Branch vessel evaluation is limited by motion artifact, however no proximal branch occlusion is identified. No aneurysm is identified within limitations of motion. IMPRESSION: 1. Punctate acute left frontal and bilateral occipital infarcts. 2. Chronic left frontal, right parietal, and bilateral cerebellar infarcts. 3. Moderate chronic small vessel ischemic disease. 4. Motion degraded head MRA without major branch occlusion. Electronically  Signed   By: Logan Bores M.D.   On: 02/06/2018 12:37   US Carotid Bilateral  Result Date: 02/06/2018 CLINICAL DATA:  Cerebral infarction.  Hypertension, syncope. EXAM: BILATERAL CAROTID DUPLEX ULTRASOUND TECHNIQUE: Pearline Cables scale imaging, color Doppler and duplex ultrasound were performed of bilateral carotid and vertebral arteries in the neck. COMPARISON:  None. FINDINGS: Criteria: Quantification of carotid stenosis is based on velocity parameters that correlate the residual internal carotid diameter with NASCET-based stenosis levels, using the diameter of the distal internal carotid lumen as the denominator for stenosis measurement. The following velocity measurements were obtained: RIGHT ICA: 91/33 cm/sec CCA: 23/55 cm/sec SYSTOLIC ICA/CCA RATIO:  0.0 ECA: 58 cm/sec LEFT ICA: 96/36 cm/sec CCA: 7322 cm/sec SYSTOLIC ICA/CCA RATIO:  1.3 ECA: 55 cm/sec RIGHT CAROTID ARTERY: Mild tortuosity. No significant plaque accumulation or stenosis. Normal waveforms and color Doppler signal. RIGHT VERTEBRAL ARTERY:  Normal flow direction and waveform. LEFT CAROTID ARTERY: Mild intimal thickening in the common carotid artery. Mild eccentric nonocclusive plaque in the bulb. Normal waveforms and color Doppler signal. Mild tortuosity of the ICA. LEFT VERTEBRAL ARTERY:  Normal flow direction and waveform. IMPRESSION: 1. Mild left carotid bifurcation plaque resulting in less than 50% diameter stenosis. 2.  Antegrade bilateral vertebral arterial flow.  Electronically Signed   By: Lucrezia Europe M.D.   On: 02/06/2018 09:42   Dg Hip Unilat W Or Wo Pelvis 2-3 Views Left  Result Date: 02/05/2018 CLINICAL DATA:  Acute LEFT hip pain following fall today. Initial encounter. EXAM: DG HIP (WITH OR WITHOUT PELVIS) 2-3V LEFT COMPARISON:  11/08/2012 pelvic CT scout film FINDINGS: No acute fracture or dislocation. No focal bony lesions are identified. The joint spaces are unremarkable. Degenerative changes in the LOWER lumbar spine are noted.  IMPRESSION: No acute bony abnormality. Electronically Signed   By: Margarette Canada M.D.   On: 02/05/2018 16:39    Assessment: 77 y.o. female with history of hypertension, hypothyroidism, and hyperlipidemia presenting to the ED with syncopal episode without premonitory symptoms. Initial CT head showed left frontal lobe infarct, follow-up MRI/MRA head reviewed and showed punctate acute left frontal and bilateral occipital infarcts without large vessel occlusion.  Concerns for embolic etiology. US carotids bilaterally did not show significant hemodynamically stenosis. EEG  Abnormal due to right anterior temporal sharp wave discharges.  LDL 103, hemoglobin A1c 5.2.  Patient was not taking a statin, anticoagulation or antiplatelet prior to this event.  Stroke Risk Factors - family history, hyperlipidemia and hypertension  Plan: 1. Prophylactic therapy-Antiplatelet med: Aspirin - dose 81 mg/day 2. Start Statin with goal LDL <70 3. PT consult, OT consult, Speech consult 4. Echocardiogram pending, if unremarkable will need outpatient TEE for further evaluation of embolic stroke 5. Outpatient neurology follow up for abnormal EEG brain, will not start AED during this admission given single episode. 6 Check orthostatics  This patient was staffed with Dr. Irish Elders, Alease Frame who personally evaluated patient, reviewed documentation and agreed with assessment and plan of care as above.  Rufina Falco, DNP, FNP-BC Board certified Nurse Practitioner Neurology Department   02/06/2018, 1:51 PM

## 2018-02-06 NOTE — Progress Notes (Signed)
PT Cancellation Note  Patient Details Name: KAYAH HECKER MRN: [id number] DOB: 05-06-1940   Cancelled Treatment:    Reason Eval/Treat Not Completed: Patient at procedure or test/unavailable.  Order received.  Chart reviewed.  Pt currently with imaging.  Will re-attempt later when time allows.   Roxanne Gates, PT, DPT 02/06/2018, 11:40 AM

## 2018-02-06 NOTE — Plan of Care (Signed)
  Problem: Education: Goal: Knowledge of General Education information will improve Description Including pain rating scale, medication(s)/side effects and non-pharmacologic comfort measures 02/06/2018 1723 by Herbie Baltimore, RN Outcome: Progressing 02/06/2018 1601 by Herbie Baltimore, RN Outcome: Progressing   Problem: Health Behavior/Discharge Planning: Goal: Ability to manage health-related needs will improve 02/06/2018 1723 by Herbie Baltimore, RN Outcome: Progressing 02/06/2018 1601 by Herbie Baltimore, RN Outcome: Progressing   Problem: Clinical Measurements: Goal: Ability to maintain clinical measurements within normal limits will improve 02/06/2018 1723 by Herbie Baltimore, RN Outcome: Progressing 02/06/2018 1601 by Herbie Baltimore, RN Outcome: Progressing Goal: Will remain free from infection 02/06/2018 1723 by Herbie Baltimore, RN Outcome: Progressing 02/06/2018 1601 by Herbie Baltimore, RN Outcome: Progressing Goal: Diagnostic test results will improve 02/06/2018 1723 by Herbie Baltimore, RN Outcome: Progressing 02/06/2018 1601 by Herbie Baltimore, RN Outcome: Progressing Goal: Respiratory complications will improve 02/06/2018 1723 by Herbie Baltimore, RN Outcome: Progressing 02/06/2018 1601 by Herbie Baltimore, RN Outcome: Progressing Goal: Cardiovascular complication will be avoided 02/06/2018 1723 by Herbie Baltimore, RN Outcome: Progressing 02/06/2018 1601 by Herbie Baltimore, RN Outcome: Progressing   Problem: Activity: Goal: Risk for activity intolerance will decrease 02/06/2018 1723 by Herbie Baltimore, RN Outcome: Progressing 02/06/2018 1601 by Herbie Baltimore, RN Outcome: Progressing   Problem: Nutrition: Goal: Adequate nutrition will be maintained 02/06/2018 1723 by Herbie Baltimore, RN Outcome: Progressing 02/06/2018 1601 by Herbie Baltimore, RN Outcome:  Progressing   Problem: Coping: Goal: Level of anxiety will decrease 02/06/2018 1723 by Herbie Baltimore, RN Outcome: Progressing 02/06/2018 1601 by Herbie Baltimore, RN Outcome: Progressing   Problem: Elimination: Goal: Will not experience complications related to bowel motility 02/06/2018 1723 by Herbie Baltimore, RN Outcome: Progressing 02/06/2018 1601 by Herbie Baltimore, RN Outcome: Progressing Goal: Will not experience complications related to urinary retention 02/06/2018 1723 by Herbie Baltimore, RN Outcome: Progressing 02/06/2018 1601 by Herbie Baltimore, RN Outcome: Progressing   Problem: Pain Managment: Goal: General experience of comfort will improve 02/06/2018 1723 by Herbie Baltimore, RN Outcome: Progressing 02/06/2018 1601 by Herbie Baltimore, RN Outcome: Progressing   Problem: Safety: Goal: Ability to remain free from injury will improve 02/06/2018 1723 by Herbie Baltimore, RN Outcome: Progressing 02/06/2018 1601 by Herbie Baltimore, RN Outcome: Progressing   Problem: Skin Integrity: Goal: Risk for impaired skin integrity will decrease 02/06/2018 1723 by Herbie Baltimore, RN Outcome: Progressing 02/06/2018 1601 by Herbie Baltimore, RN Outcome: Progressing   Problem: Coping: Goal: Will verbalize positive feelings about self 02/06/2018 1723 by Herbie Baltimore, RN Outcome: Progressing 02/06/2018 1601 by Herbie Baltimore, RN Outcome: Progressing Goal: Will identify appropriate support needs 02/06/2018 1723 by Herbie Baltimore, RN Outcome: Progressing 02/06/2018 1601 by Herbie Baltimore, RN Outcome: Progressing   Problem: Health Behavior/Discharge Planning: Goal: Ability to manage health-related needs will improve 02/06/2018 1723 by Herbie Baltimore, RN Outcome: Progressing 02/06/2018 1601 by Herbie Baltimore, RN Outcome: Progressing   Problem: Nutrition: Goal: Dietary  intake will improve 02/06/2018 1723 by Herbie Baltimore, RN Outcome: Progressing 02/06/2018 1601 by Herbie Baltimore, RN Outcome: Progressing    Problem: Ischemic Stroke/TIA Tissue Perfusion: Goal: Complications of ischemic stroke/TIA will be minimized 02/06/2018 1723 by Herbie Baltimore, RN Outcome: Progressing 02/06/2018 1601 by Herbie Baltimore, RN Outcome: Progressing

## 2018-02-06 NOTE — Procedures (Addendum)
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

## 2018-02-06 NOTE — Progress Notes (Signed)
Patient refuses MRI at this time, patient wants to wait until morning.

## 2018-02-06 NOTE — Progress Notes (Signed)
SLP Cancellation Note  Patient Details Name: Kaylee [name] MRN: [id number] DOB: 03/21/1941   Cancelled treatment:       Reason Eval/Treat Not Completed: SLP screened, no needs identified, will sign off(chart reviewed; consulted NSG then met w/ pt/family in room). Pt denied any difficulty swallowing and is currently on a regular diet; tolerates swallowing pills w/ water per NSG. Pt conversed at conversational level w/out deficits noted; pt and family denied any speech-language deficits.  No further skilled ST services indicated as pt appears at her baseline. Pt agreed. NSG to reconsult if any change in status while admitted.      Orinda Kenner, [place], CCC-SLP Watson,Katherine 02/06/2018, 2:47 PM

## 2018-02-06 NOTE — Plan of Care (Signed)
  Problem: Education: Goal: Knowledge of General Education information will improve Description Including pain rating scale, medication(s)/side effects and non-pharmacologic comfort measures Outcome: Progressing   Problem: Health Behavior/Discharge Planning: Goal: Ability to manage health-related needs will improve Outcome: Progressing   Problem: Clinical Measurements: Goal: Ability to maintain clinical measurements within normal limits will improve Outcome: Progressing Goal: Will remain free from infection Outcome: Progressing Goal: Diagnostic test results will improve Outcome: Progressing Goal: Respiratory complications will improve Outcome: Progressing Goal: Cardiovascular complication will be avoided Outcome: Progressing   Problem: Activity: Goal: Risk for activity intolerance will decrease Outcome: Progressing   Problem: Nutrition: Goal: Adequate nutrition will be maintained Outcome: Progressing   Problem: Coping: Goal: Level of anxiety will decrease Outcome: Progressing   Problem: Elimination: Goal: Will not experience complications related to bowel motility Outcome: Progressing Goal: Will not experience complications related to urinary retention Outcome: Progressing   Problem: Pain Managment: Goal: General experience of comfort will improve Outcome: Progressing   Problem: Safety: Goal: Ability to remain free from injury will improve Outcome: Progressing   Problem: Skin Integrity: Goal: Risk for impaired skin integrity will decrease Outcome: Progressing   Problem: Education: Goal: Knowledge of disease or condition will improve Outcome: Progressing Goal: Knowledge of secondary prevention will improve Outcome: Progressing Goal: Knowledge of patient specific risk factors addressed and post discharge goals established will improve Outcome: Progressing   Problem: Coping: Goal: Will verbalize positive feelings about self Outcome: Progressing Goal: Will  identify appropriate support needs Outcome: Progressing   Problem: Health Behavior/Discharge Planning: Goal: Ability to manage health-related needs will improve Outcome: Progressing   Problem: Self-Care: Goal: Ability to participate in self-care as condition permits will improve Outcome: Progressing Goal: Verbalization of feelings and concerns over difficulty with self-care will improve Outcome: Progressing   Problem: Nutrition: Goal: Dietary intake will improve Outcome: Progressing

## 2018-02-06 NOTE — Progress Notes (Signed)
Kaylee [name] at [facility]                                                                                                                                                                                  Kaylee [name] Demographics   Kaylee [name], is a 77 y.o. female, DOB - 02/03/1941, GNO:[id number]  Admit date - 02/05/2018   Admitting Physician Henreitta Leber, MD  Outpatient Primary MD for the Kaylee [name] is Kaylee [name], No Pcp Per   LOS - 0  Subjective: Kaylee [name] admitted with CVA, she is very anxious to go home. Has had multiple falls recently   Review of Systems:   CONSTITUTIONAL: No documented fever. No fatigue, weakness. No weight gain, no weight loss.  EYES: No blurry or double vision.  ENT: No tinnitus. No postnasal drip. No redness of the oropharynx.  RESPIRATORY: No cough, no wheeze, no hemoptysis. No dyspnea.  CARDIOVASCULAR: No chest pain. No orthopnea. No palpitations. No syncope.  GASTROINTESTINAL: No nausea, no vomiting or diarrhea. No abdominal pain. No melena or hematochezia.  GENITOURINARY: No dysuria or hematuria.  ENDOCRINE: No polyuria or nocturia. No heat or cold intolerance.  HEMATOLOGY: No anemia. No bruising. No bleeding.  INTEGUMENTARY: No rashes. No lesions.  MUSCULOSKELETAL: No arthritis. No swelling. No gout.  NEUROLOGIC: No numbness, tingling, or ataxia. No seizure-type activity.  PSYCHIATRIC: No anxiety. No insomnia. No ADD.    Vitals:   Vitals:   02/06/18 0208 02/06/18 0410 02/06/18 0600 02/06/18 1007  BP: 115/76 113/65 115/67 (!) 146/86  Pulse: 73 68 73 68  Resp: 20 18  16   Temp: 98.4 F (36.9 C) 99.9 F (37.7 C) 98.4 F (36.9 C) 98.8 F (37.1 C)  TempSrc: Oral Oral Oral Oral  SpO2: 100%  98%   Weight:  62.2 kg    Height:        Wt Readings from Last 3 Encounters:  02/06/18 62.2 kg    No intake or output data in the 24 hours ending 02/06/18 1437  Physical Exam:   GENERAL: Pleasant-appearing in no apparent distress.   HEAD, EYES, EARS, NOSE AND THROAT: Atraumatic, normocephalic. Extraocular muscles are intact. Pupils equal and reactive to light. Sclerae anicteric. No conjunctival injection. No oro-pharyngeal erythema.  NECK: Supple. There is no jugular venous distention. No bruits, no lymphadenopathy, no thyromegaly.  HEART: Regular rate and rhythm,. No murmurs, no rubs, no clicks.  LUNGS: Clear to auscultation bilaterally. No rales or rhonchi. No wheezes.  ABDOMEN: Soft, flat, nontender, nondistended. Has good bowel sounds. No hepatosplenomegaly appreciated.  EXTREMITIES: No evidence of any cyanosis, clubbing, or peripheral edema.  +2 pedal and radial pulses bilaterally.  NEUROLOGIC: The Kaylee [name]  is alert, awake, and oriented x3 with no focal motor or sensory deficits appreciated bilaterally.  SKIN: Moist and warm with no rashes appreciated.  Psych: Not anxious, depressed LN: No inguinal LN enlargement    Antibiotics   Anti-infectives (From admission, onward)   None      Medications   Scheduled Meds: . aspirin EC  81 mg Oral Daily  . atorvastatin  40 mg Oral q1800  . enoxaparin (LOVENOX) injection  40 mg Subcutaneous Q24H  . multivitamin with minerals  1 tablet Oral Weekly   Continuous Infusions: PRN Meds:.acetaminophen **OR** acetaminophen, hydrALAZINE, ondansetron **OR** ondansetron (ZOFRAN) IV   Data Review:   Micro Results No results found for this or any previous visit (from the past 240 hour(s)).  Radiology Reports Ct Head Wo Contrast  Result Date: 02/05/2018 CLINICAL DATA:  Kaylee [name] passed out and hit back of head.  No pain. EXAM: CT HEAD WITHOUT CONTRAST TECHNIQUE: Contiguous axial images were obtained from the base of the skull through the vertex without intravenous contrast. COMPARISON:  None. FINDINGS: Brain: No subdural, epidural, or subarachnoid hemorrhage. There is a lacunar infarct in the left cerebellar hemisphere. Cerebellum, brainstem, and basal cisterns are otherwise  normal. Ventricles and sulci are prominent, consistent with volume loss. White matter changes noted. An infarct in the left frontal lobe is suspected to be nonacute. No other evidence of acute ischemia infarct. No mass effect or midline shift. Vascular: No hyperdense vessel or unexpected calcification. Skull: Normal. Negative for fracture or focal lesion. Sinuses/Orbits: No acute finding. Other: None. IMPRESSION: 1. 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 Chronic left cerebellar hemisphere lacunar infarct. No other evidence of acute abnormality. Electronically Signed   By: Dorise Bullion III M.D   On: 02/05/2018 16:20   Kaylee [name] Head Wo Contrast  Result Date: 02/06/2018 CLINICAL DATA:  Syncope. EXAM: MRI HEAD WITHOUT CONTRAST MRA HEAD WITHOUT CONTRAST TECHNIQUE: Multiplanar, multiecho pulse sequences of the brain and surrounding structures were obtained without intravenous contrast. Angiographic images of the head were obtained using MRA technique without contrast. COMPARISON:  Head CT 02/05/2018 FINDINGS: MRI HEAD FINDINGS The study is mildly motion degraded. Brain: There punctate acute infarcts in the left middle frontal gyrus and both occipital lobes. Chronic infarcts are noted in the left frontal lobe and right parietal lobe with a small amount of chronic blood products. Chronic infarcts are present in both cerebellar hemispheres. Patchy cerebral white matter T2 hyperintensities separate from the aforementioned chronic infarcts are nonspecific but compatible with moderate chronic small vessel ischemic disease. There is mild cerebral atrophy. No mass, midline shift, or extra-axial fluid collection is identified. 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 Vascular: Major intracranial vascular flow voids are preserved. Skull and upper cervical spine: Unremarkable bone marrow signal. Sinuses/Orbits: Unremarkable  orbits. Paranasal sinuses and mastoid air cells are clear. Other: None. MRA HEAD FINDINGS The study is moderately motion degraded. The visualized distal vertebral arteries are patent to the basilar and codominant. The basilar artery is patent without evidence of significant stenosis allowing for motion artifact proximally. Patent SCA origins are identified bilaterally. There is a small right posterior communicating artery. The PCAs are patent with asymmetric attenuation of distal branch vessels on the left but no evidence of significant proximal stenosis. The internal carotid arteries are patent from skull base to carotid termini with evaluation for stenosis limited by motion artifact. ACAs and MCAs are patent without  evidence of significant A1 or M1 stenosis. Branch vessel evaluation is limited by motion artifact, however no proximal branch occlusion is identified. No aneurysm is identified within limitations of motion. IMPRESSION: 1. Punctate acute left frontal and bilateral occipital infarcts. 2. Chronic left frontal, right parietal, and bilateral cerebellar infarcts. 3. Moderate chronic small vessel ischemic disease. 4. Motion degraded head MRA without major branch occlusion. Electronically Signed   By: Logan Bores M.D.   On: 02/06/2018 12:37   Kaylee Brain Wo Contrast  Result Date: 02/06/2018 CLINICAL DATA:  Syncope. EXAM: MRI HEAD WITHOUT CONTRAST MRA HEAD WITHOUT CONTRAST TECHNIQUE: Multiplanar, multiecho pulse sequences of the brain and surrounding structures were obtained without intravenous contrast. Angiographic images of the head were obtained using MRA technique without contrast. COMPARISON:  Head CT 02/05/2018 FINDINGS: MRI HEAD FINDINGS The study is mildly motion degraded. Brain: There punctate acute infarcts in the left middle frontal gyrus and both occipital lobes. Chronic infarcts are noted in the left frontal lobe and right parietal lobe with a small amount of chronic blood products. Chronic  infarcts are present in both cerebellar hemispheres. Patchy cerebral white matter T2 hyperintensities separate from the aforementioned chronic infarcts are nonspecific but compatible with moderate chronic small vessel ischemic disease. There is mild cerebral atrophy. No mass, midline shift, or extra-axial fluid collection is identified. 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 Vascular: Major intracranial vascular flow voids are preserved. Skull and upper cervical spine: Unremarkable bone marrow signal. Sinuses/Orbits: Unremarkable orbits. Paranasal sinuses and mastoid air cells are clear. Other: None. MRA HEAD FINDINGS The study is moderately motion degraded. The visualized distal vertebral arteries are patent to the basilar and codominant. The basilar artery is patent without evidence of significant stenosis allowing for motion artifact proximally. Patent SCA origins are identified bilaterally. There is a small right posterior communicating artery. The PCAs are patent with asymmetric attenuation of distal branch vessels on the left but no evidence of significant proximal stenosis. The internal carotid arteries are patent from skull base to carotid termini with evaluation for stenosis limited by motion artifact. ACAs and MCAs are patent without evidence of significant A1 or M1 stenosis. Branch vessel evaluation is limited by motion artifact, however no proximal branch occlusion is identified. No aneurysm is identified within limitations of motion. IMPRESSION: 1. Punctate acute left frontal and bilateral occipital infarcts. 2. Chronic left frontal, right parietal, and bilateral cerebellar infarcts. 3. Moderate chronic small vessel ischemic disease. 4. Motion degraded head MRA without major branch occlusion. Electronically Signed   By: Logan Bores M.D.   On: 02/06/2018 12:37   US Carotid Bilateral  Result Date:  02/06/2018 CLINICAL DATA:  Cerebral infarction.  Hypertension, syncope. EXAM: BILATERAL CAROTID DUPLEX ULTRASOUND TECHNIQUE: Pearline Cables scale imaging, color Doppler and duplex ultrasound were performed of bilateral carotid and vertebral arteries in the neck. COMPARISON:  None. FINDINGS: Criteria: Quantification of carotid stenosis is based on velocity parameters that correlate the residual internal carotid diameter with NASCET-based stenosis levels, using the diameter of the distal internal carotid lumen as the denominator for stenosis measurement. The following velocity measurements were obtained: RIGHT ICA: 91/33 cm/sec CCA: 47/09 cm/sec SYSTOLIC ICA/CCA RATIO:  0.0 ECA: 58 cm/sec LEFT ICA: 96/36 cm/sec CCA: 6283 cm/sec SYSTOLIC ICA/CCA RATIO:  1.3 ECA: 55 cm/sec RIGHT CAROTID ARTERY: Mild tortuosity. No significant plaque accumulation or stenosis. Normal waveforms and color Doppler signal. RIGHT VERTEBRAL ARTERY:  Normal flow direction and  waveform. LEFT CAROTID ARTERY: Mild intimal thickening in the common carotid artery. Mild eccentric nonocclusive plaque in the bulb. Normal waveforms and color Doppler signal. Mild tortuosity of the ICA. LEFT VERTEBRAL ARTERY:  Normal flow direction and waveform. IMPRESSION: 1. Mild left carotid bifurcation plaque resulting in less than 50% diameter stenosis. 2.  Antegrade bilateral vertebral arterial flow. Electronically Signed   By: Lucrezia Europe M.D.   On: 02/06/2018 09:42   Dg Hip Unilat W Or Wo Pelvis 2-3 Views Left  Result Date: 02/05/2018 CLINICAL DATA:  Acute LEFT hip pain following fall today. Initial encounter. EXAM: DG HIP (WITH OR WITHOUT PELVIS) 2-3V LEFT COMPARISON:  11/08/2012 pelvic CT scout film FINDINGS: No acute fracture or dislocation. No focal bony lesions are identified. The joint spaces are unremarkable. Degenerative changes in the LOWER lumbar spine are noted. IMPRESSION: No acute bony abnormality. Electronically Signed   By: Margarette Canada M.D.   On:  02/05/2018 16:39     CBC Recent Labs  Lab 02/05/18 1412  WBC 6.7  HGB 13.6  HCT 41.4  PLT 117*  MCV 95.8  MCH 31.5  MCHC 32.9  RDW 12.4    Chemistries  Recent Labs  Lab 02/05/18 1412  NA 139  K 4.0  CL 105  CO2 26  GLUCOSE 146*  BUN 19  CREATININE 1.00  CALCIUM 9.3   ------------------------------------------------------------------------------------------------------------------ estimated creatinine clearance is 41 mL/min (by C-G formula based on SCr of 1 mg/dL). ------------------------------------------------------------------------------------------------------------------ Recent Labs    02/06/18 0505  HGBA1C 5.2   ------------------------------------------------------------------------------------------------------------------ Recent Labs    02/06/18 0505  CHOL 173  HDL 63  LDLCALC 103*  TRIG 33  CHOLHDL 2.7   ------------------------------------------------------------------------------------------------------------------ Recent Labs    02/05/18 1412  TSH 5.077*   ------------------------------------------------------------------------------------------------------------------ No results for input(s): VITAMINB12, FOLATE, FERRITIN, TIBC, IRON, RETICCTPCT in the last 72 hours.  Coagulation profile No results for input(s): INR, PROTIME in the last 168 hours.  No results for input(s): DDIMER in the last 72 hours.  Cardiac Enzymes Recent Labs  Lab 02/05/18 1421  TROPONINI <0.03   ------------------------------------------------------------------------------------------------------------------ Invalid input(s): POCBNP    Assessment & Plan   77 year old female with past medical history of hypothyroidism, hypertension but currently on no medications and has not seen a physician in a few years who presents to the hospital after a syncopal episode and noted to have CT scan findings suggestive of a acute/subacute CVA.  1.    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 Irish Elders of neurology, who feels that Kaylee [name] will need a TEE however he states that this could be done as outpatient I am still awaiting physical therapy evaluation. Continue aspirin High intensity statin   2.  Syncope- etiology unclear.   Etiology read as being abnormal.  Discussed with Dr. Irish Elders of neurology who does not feel that Kaylee [name] needs antiepileptics at this point recommends outpatient follow-up with neurology   3.  Essential hypertension-Kaylee [name] currently on no medications. - Continue use hydralazine IV as needed, Kaylee [name] would likely benefit from ACE inhibitor on discharge.  4.  Hypothyroidism- TSH slightly abnormal currently needs no treatment  5.  Miscellaneous Lovenox for DVT prophylaxis      Code Status Orders  (From admission, onward)         Start     Ordered   02/05/18 2006  Full code  Continuous     02/05/18 2005  Code Status History    This Kaylee [name] has a current code status but no historical code status.           Consults  Neuro,   DVT Prophylaxis  Lovenox   Lab Results  Component Value Date   PLT 117 (L) 02/05/2018     Time Spent in minutes  67min  Greater than 50% of time spent in care coordination and counseling Kaylee [name] regarding the condition and plan of care.   Dustin Flock M.D on 02/06/2018 at 2:37 PM  Between 7am to 6pm - Pager - [phone]  After 6pm go to [email or web address] - Proofreader  [employer]   Office  [phone]

## 2018-02-06 NOTE — Progress Notes (Unsigned)
eeg completed ° °

## 2018-02-06 NOTE — Progress Notes (Signed)
*  PRELIMINARY RESULTS* Echocardiogram 2D Echocardiogram has been performed.  Kaylee [name] 02/06/2018, 2:13 PM

## 2018-02-06 NOTE — Progress Notes (Signed)
Initial Nutrition Assessment  DOCUMENTATION CODES:   Not applicable (will complete NFPE assess for malnutrition at follow-up)  INTERVENTION:   - Continue MVI with minerals  - Magic cup BID with meals, each supplement provides 290 kcal and 9 grams of protein  NUTRITION DIAGNOSIS:   Unintentional weight loss related to decreased appetite as evidenced by per patient/family report.  GOAL:   Patient will meet greater than or equal to 90% of their needs  MONITOR:   PO intake, Supplement acceptance, Labs, Weight trends  REASON FOR ASSESSMENT:   Malnutrition Screening Tool    ASSESSMENT:   77 year old female who presented to the ED on 10/24 after a fall and LOC. PMH significant for hypothyroidism and hypertension. CT of the head showed a subacute CVA of left frontal lobe.  Spoke with pt and various family members present. Pt eating lunch at time of visit and had completed 75% at end of RD interview and was continuing to eat. Pt states that she was hungry because she did not get a chance to eat breakfast due to procedure.  Pt reports that she typically eats 1 big meal daily which may include pinto beans, cornbread, chicken, and greens. Pt drinks mostly water and occasionally has tea and soda. Pt does not eat eggs or beef products due to taste preference.  Pt denies recent weight loss and reports that she did experience weight loss 3-4 years ago after her husband passed away. Pt is unsure of the amount. Pt reports that she had lost her appetite during this time. No weight history in pt's chart PTA.  Pt does not like most oral nutrition supplements but is willing to try Magic Cup.  Pt denies any issues chewing or swallowing.  Suspect some degree of malnutrition but unable to confirm at this time without NFPE. Will re-attempt at follow-up.  Medications reviewed and include: MVI with minerals tablet weekly  Labs reviewed: LDL 103 (H)  NUTRITION - FOCUSED PHYSICAL EXAM:  Deferred  due to large family presence at time of visit.  Diet Order:   Diet Order            Diet Heart Room service appropriate? Yes; Fluid consistency: Thin  Diet effective now              EDUCATION NEEDS:   Education needs have been addressed  Skin:  Skin Assessment: Reviewed RN Assessment  Last BM:  unknown/PTA  Height:   Ht Readings from Last 1 Encounters:  02/05/18 5\' 5"  (1.651 m)    Weight:   Wt Readings from Last 1 Encounters:  02/06/18 62.2 kg    Ideal Body Weight:  56.82 kg  BMI:  Body mass index is 22.83 kg/m.  Estimated Nutritional Needs:   Kcal:  1400-1600  Protein:  90-80 grams  Fluid:  >/= 1.5 L    Gaynell Face, MS, RD, LDN Inpatient Clinical Dietitian Pager: [phone] Weekend/After Hours: [phone]

## 2018-02-06 NOTE — Evaluation (Signed)
Physical Therapy Evaluation Patient Details Name: Kaylee [name] MRN: [id number] DOB: 1941-03-12 Today's Date: 02/06/2018   History of Present Illness  77 y/o female who had a LOC and fall with no seizing symptoms.  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    Clinical Impression  Pt with consistent confusion t/o the session, family reports this is baseline (PT confirmed multiple times that this was indeed the case).  She does have some new onset balance issues that compiled with lack of awareness makes her considerably less safe than she apparently was at baseline.  Pt does not normally need AD, however today even with walker she was unsteady and had multiple stagger steps and near falls.  She showed good effort and willingness to participate but did not seem aware of how unsteady she actually was.  Pt will require HHPT to address these issues and discussed with family increased supervision and proper ways to cue using walker.      Follow Up Recommendations Home health PT;Supervision for mobility/OOB    Equipment Recommendations  Rolling walker with 5" wheels    Recommendations for Other Services       Precautions / Restrictions Precautions Precautions: Fall Restrictions Weight Bearing Restrictions: No      Mobility  Bed Mobility Overal bed mobility: Modified Independent             General bed mobility comments: Pt able to get to sitting EOB w/o hesitation  Transfers Overall transfer level: Needs assistance Equipment used: None;Rolling walker (2 wheeled) Transfers: Sit to/from Stand Sit to Stand: Min assist         General transfer comment: Pt was able to rise to standing w/o direct assist but was quite unsteady and quickly needed direct assist to keep her upright  Ambulation/Gait Ambulation/Gait assistance: Min assist;Min guard Gait Distance (Feet): 125 Feet Assistive device: Rolling walker (2 wheeled)        General Gait Details: Initial attempt to walk w/o AD (baseline) was unsteady.  She was safer with walker, but overall showed poor awareness with walker, had numerous small stagger steps and 1 true LOB that required direct assist to maintain upright.  Pt safe while using walker appropriate, at times she lifted it (especially during turns) and generally she showed poor safety/AD use awareness.  Stairs            Wheelchair Mobility    Modified Rankin (Stroke Patients Only)       Balance Overall balance assessment: Needs assistance Sitting-balance support: No upper extremity supported Sitting balance-Leahy Scale: Good     Standing balance support: Bilateral upper extremity supported Standing balance-Leahy Scale: Poor Standing balance comment: Pt very unsteady w/o AD, and even with walker she was unsteady secondary to poor walker use and general lack of situation awareness as well as pure balance limitations.                             Pertinent Vitals/Pain Pain Assessment: No/denies pain    Home Living Family/patient expects to be discharged to:: Private residence Living Arrangements: Other relatives(sister) Available Help at Discharge: Family(family feels they can provide 24/7 for a while if needed) Type of Home: House Home Access: Stairs to enter Entrance Stairs-Rails: Can reach both Entrance Stairs-Number of Steps: 4   Home Equipment: None      Prior Function Level of Independence: Independent  Comments: Pt apparently rarely outside the home, able to manage in the home     Hand Dominance        Extremity/Trunk Assessment   Upper Extremity Assessment Upper Extremity Assessment: Generalized weakness(minimally weaker with L shoulder/elbow/grip)    Lower Extremity Assessment Lower Extremity Assessment: Generalized weakness(grossly equal bilerally)       Communication   Communication: No difficulties  Cognition Arousal/Alertness:  Awake/alert Behavior During Therapy: Impulsive Overall Cognitive Status: History of cognitive impairments - at baseline                                 General Comments: Pt with poor general awareness and needing repeated cuing for most tasks, family reports that this is close to baseline (which is surprising to this clinician)       General Comments      Exercises     Assessment/Plan    PT Assessment Patient needs continued PT services  PT Problem List Decreased strength;Decreased range of motion;Decreased activity tolerance;Decreased balance;Decreased mobility;Decreased coordination;Decreased cognition;Decreased knowledge of use of DME;Decreased safety awareness       PT Treatment Interventions Gait training;Stair training;Functional mobility training;Therapeutic activities;Therapeutic exercise;Balance training;Neuromuscular re-education;Patient/family education;Cognitive remediation;DME instruction    PT Goals (Current goals can be found in the Care Plan section)  Acute Rehab PT Goals Patient Stated Goal: go home PT Goal Formulation: With patient Time For Goal Achievement: 02/20/18 Potential to Achieve Goals: Fair    Frequency 7X/week   Barriers to discharge        Co-evaluation               AM-PAC PT "6 Clicks" Daily Activity  Outcome Measure Difficulty turning over in bed (including adjusting bedclothes, sheets and blankets)?: None Difficulty moving from lying on back to sitting on the side of the bed? : None Difficulty sitting down on and standing up from a chair with arms (e.g., wheelchair, bedside commode, etc,.)?: Unable Help needed moving to and from a bed to chair (including a wheelchair)?: A Little Help needed walking in hospital room?: A Lot Help needed climbing 3-5 steps with a railing? : A Lot 6 Click Score: 16    End of Session Equipment Utilized During Treatment: Gait belt Activity Tolerance: Patient tolerated treatment  well Patient left: with chair alarm set;with call bell/phone within reach;with family/visitor present Nurse Communication: Mobility status PT Visit Diagnosis: Unsteadiness on feet (R26.81);Difficulty in walking, not elsewhere classified (R26.2);Other symptoms and signs involving the nervous system (R29.898)    Time: 1540-1600 PT Time Calculation (min) (ACUTE ONLY): 20 min   Charges:   PT Evaluation $PT Eval Low Complexity: 1 Low          Kreg Shropshire, DPT 02/06/2018, 4:50 PM

## 2018-02-06 NOTE — Care Management (Signed)
Patient placed in observation for sx concerning for cva and MRI is positive.  Spoke with patient and her son.  She has no PCP and will be in need of home health physical therapy. She does not have a walker.  She is not on any meds but a vitamin. She is agreeable to have home health. Unit secretary was able to schedule an appointment with [facility] on [address] on 17/00/1749. No agency preference for home health.  Referral for PT OT called to and accepted by [facility].  Agency will be able to see patient within 24 hours of discharge.

## 2018-02-06 NOTE — Care Management Obs Status (Signed)
[place] NOTIFICATION   Patient Details  Name: MELANNY WIRE MRN: [id number] Date of Birth: [birth year]   Medicare Observation Status Notification Given:  No  Discharge order placed in < 24hr of being placed on observation  Katrina Stack, RN 02/06/2018, 6:10 PM

## 2018-02-06 NOTE — Progress Notes (Signed)
OT Cancellation Note  Patient Details Name: Kaylee [name] MRN: [id number] DOB: 09/02/1940   Cancelled Treatment:    Reason Eval/Treat Not Completed: Patient at procedure or test/ unavailable. Order received, chart reviewed. Pt out of room for testing upon initial attempt. Will re-attempt OT evaluation at later date/time as pt is available and medically appropriate.  Jeni Salles, MPH, MS, OTR/L ascom [phone] 02/06/18, 1:12 PM

## 2018-02-06 NOTE — Progress Notes (Signed)
OT Cancellation Note  Patient Details Name: DANEA MANTER MRN: [id number] DOB: 05-Apr-1941   Cancelled Treatment:    Reason Eval/Treat Not Completed: Patient at procedure or test/ unavailable. Pt out of the room on 2nd attempt. Will re-attempt OT evaluation at later date/time as pt is available and medically appropriate.  Jeni Salles, MPH, MS, OTR/L ascom [phone] 02/06/18, 1:44 PM

## 2018-02-07 DIAGNOSIS — I63423 Cerebral infarction due to embolism of bilateral anterior cerebral arteries: Secondary | ICD-10-CM

## 2018-02-07 DIAGNOSIS — R9389 Abnormal findings on diagnostic imaging of other specified body structures: Secondary | ICD-10-CM

## 2018-02-07 MED ORDER — ATORVASTATIN CALCIUM 40 MG PO TABS: 40.0000 mg | ORAL_TABLET | Freq: Every day | ORAL | 0 refills | Status: DC

## 2018-02-07 MED ORDER — ASPIRIN 81 MG PO TBEC: 81.0000 mg | DELAYED_RELEASE_TABLET | Freq: Every day | ORAL | 0 refills | Status: DC

## 2018-02-07 NOTE — Discharge Summary (Signed)
[facility] NAME: Kaylee [name]    MR#:  [id number]  DATE OF BIRTH:  [birth year]  DATE OF ADMISSION:  02/05/2018 ADMITTING PHYSICIAN: Henreitta Leber, MD  DATE OF DISCHARGE: 02/07/2018  PRIMARY CARE PHYSICIAN: Philis Nettle   ADMISSION DIAGNOSIS:  Abnormal CT scan [R93.89] Fall, initial encounter [W19.XXXA]  DISCHARGE DIAGNOSIS:  Active Problems:   CVA (cerebral vascular accident) ([place])   SECONDARY DIAGNOSIS:   Past Medical History:  Diagnosis Date  . Thyroid disease     HOSPITAL COURSE:   1.  Acute CVA involving bilateral occipital areas and left frontal area.  This could be embolic in nature.  Patient was in normal sinus rhythm during the hospital course.  Case discussed with Dr. Irish Elders neurology and he is okay with aspirin and Lipitor for stroke prevention.  He was also okay with discharge today and follow-up as outpatient with cardiology for potential TEE and Linq or Holter monitor.  This was explained to the son and patient that follow-up is going to be key here.  Referred to Dr. Rockey Situ as outpatient for follow-up. 2.  Syncope.  Likely secondary to stroke 3.  Essential hypertension.  Allow permissive hypertension at this point. 4.  Hyperlipidemia unspecified.  LDL 103.  Goal less than 70 5.  TSH 5.07 6.  Home health set up. 7.  Thrombocytopenia.  Recheck CBC in a few weeks.  Recommend checking hepatitis C profile at that time   DISCHARGE CONDITIONS:   Satisfactory  CONSULTS OBTAINED:  Treatment Team:  Leotis Pain, MD  DRUG ALLERGIES:  No Known Allergies  DISCHARGE MEDICATIONS:   Allergies as of 02/07/2018   No Known Allergies     Medication List    TAKE these medications   aspirin 81 MG EC tablet Take 1 tablet (81 mg total) by mouth daily. Start taking on:  02/08/2018   atorvastatin 40 MG tablet Commonly known as:  LIPITOR Take 1 tablet (40 mg total) by mouth daily at 6 PM.   multivitamin  tablet Take 1 tablet by mouth once a week.            Durable Medical Equipment  (From admission, onward)         Start     Ordered   02/07/18 1106  For home use only DME Walker rolling  Once    Question:  Patient needs a walker to treat with the following condition  Answer:  CVA (cerebral vascular accident) ([place])   02/07/18 1105           DISCHARGE INSTRUCTIONS:   Follow-up PMD on Monday Follow-up cardiology 1 week Follow-up neurology 2 weeks Home health set up  If you experience worsening of your admission symptoms, develop shortness of breath, life threatening emergency, suicidal or homicidal thoughts you must seek medical attention immediately by calling 911 or calling your MD immediately  if symptoms less severe.  You Must read complete instructions/literature along with all the possible adverse reactions/side effects for all the Medicines you take and that have been prescribed to you. Take any new Medicines after you have completely understood and accept all the possible adverse reactions/side effects.   Please note  You were cared for by a hospitalist during your hospital stay. If you have any questions about your discharge medications or the care you received while you were in the hospital after you are discharged, you can call the unit and asked to speak with the hospitalist  on call if the hospitalist that took care of you is not available. Once you are discharged, your primary care physician will handle any further medical issues. Please note that NO REFILLS for any discharge medications will be authorized once you are discharged, as it is imperative that you return to your primary care physician (or establish a relationship with a primary care physician if you do not have one) for your aftercare needs so that they can reassess your need for medications and monitor your lab values.    Today   CHIEF COMPLAINT:   Chief Complaint  Patient presents with  . Loss of  Consciousness    HISTORY OF PRESENT ILLNESS:  Kaylee [name]  is a 77 y.o. female brought in with fall and found to have a stroke.   VITAL SIGNS:  Blood pressure (!) 156/89, pulse 66, temperature 98.3 F (36.8 C), temperature source Oral, resp. rate 16, height 5\' 5"  (1.651 m), weight 62.2 kg, SpO2 99 %.   PHYSICAL EXAMINATION:  GENERAL:  77 y.o.-year-old patient lying in the bed with no acute distress.  EYES: Pupils equal, round, reactive to light and accommodation. No scleral icterus. Extraocular muscles intact.  HEENT: Head atraumatic, normocephalic. Oropharynx and nasopharynx clear.  NECK:  Supple, no jugular venous distention. No thyroid enlargement, no tenderness.  LUNGS: Normal breath sounds bilaterally, no wheezing, rales,rhonchi or crepitation. No use of accessory muscles of respiration.  CARDIOVASCULAR: S1, S2 normal. No murmurs, rubs, or gallops.  ABDOMEN: Soft, non-tender, non-distended. Bowel sounds present. No organomegaly or mass.  EXTREMITIES: No pedal edema, cyanosis, or clubbing.  NEUROLOGIC: Cranial nerves II through XII are intact. Muscle strength 5/5 in all extremities. Sensation intact. Gait not checked.  PSYCHIATRIC: The patient is alert and oriented x 3.  SKIN: No obvious rash, lesion, or ulcer.   DATA REVIEW:   CBC Recent Labs  Lab 02/05/18 1412  WBC 6.7  HGB 13.6  HCT 41.4  PLT 117*    Chemistries  Recent Labs  Lab 02/05/18 1412  NA 139  K 4.0  CL 105  CO2 26  GLUCOSE 146*  BUN 19  CREATININE 1.00  CALCIUM 9.3    Cardiac Enzymes Recent Labs  Lab 02/05/18 1421  TROPONINI <0.03      RADIOLOGY:  Ct Head Wo Contrast  Result Date: 02/05/2018 CLINICAL DATA:  Patient passed out and hit back of head.  No pain. EXAM: CT HEAD WITHOUT CONTRAST TECHNIQUE: Contiguous axial images were obtained from the base of the skull through the vertex without intravenous contrast. COMPARISON:  None. FINDINGS: Brain: No subdural, epidural, or subarachnoid  hemorrhage. There is a lacunar infarct in the left cerebellar hemisphere. Cerebellum, brainstem, and basal cisterns are otherwise normal. Ventricles and sulci are prominent, consistent with volume loss. White matter changes noted. An infarct in the left frontal lobe is suspected to be nonacute. No other evidence of acute ischemia infarct. No mass effect or midline shift. Vascular: No hyperdense vessel or unexpected calcification. Skull: Normal. Negative for fracture or focal lesion. Sinuses/Orbits: No acute finding. Other: None. IMPRESSION: 1. 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 Chronic left cerebellar hemisphere lacunar infarct. No other evidence of acute abnormality. Electronically Signed   By: Dorise Bullion III M.D   On: 02/05/2018 16:20   [name] Head Wo Contrast  Result Date: 02/06/2018 CLINICAL DATA:  Syncope. EXAM: MRI HEAD WITHOUT CONTRAST MRA HEAD WITHOUT CONTRAST TECHNIQUE: Multiplanar, multiecho pulse sequences of the brain and surrounding structures were obtained  without intravenous contrast. Angiographic images of the head were obtained using MRA technique without contrast. COMPARISON:  Head CT 02/05/2018 FINDINGS: MRI HEAD FINDINGS The study is mildly motion degraded. Brain: There punctate acute infarcts in the left middle frontal gyrus and both occipital lobes. Chronic infarcts are noted in the left frontal lobe and right parietal lobe with a small amount of chronic blood products. Chronic infarcts are present in both cerebellar hemispheres. Patchy cerebral white matter T2 hyperintensities separate from the aforementioned chronic infarcts are nonspecific but compatible with moderate chronic small vessel ischemic disease. There is mild cerebral atrophy. No mass, midline shift, or extra-axial fluid collection is identified. 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 Vascular: Major intracranial  vascular flow voids are preserved. Skull and upper cervical spine: Unremarkable bone marrow signal. Sinuses/Orbits: Unremarkable orbits. Paranasal sinuses and mastoid air cells are clear. Other: None. MRA HEAD FINDINGS The study is moderately motion degraded. The visualized distal vertebral arteries are patent to the basilar and codominant. The basilar artery is patent without evidence of significant stenosis allowing for motion artifact proximally. Patent SCA origins are identified bilaterally. There is a small right posterior communicating artery. The PCAs are patent with asymmetric attenuation of distal branch vessels on the left but no evidence of significant proximal stenosis. The internal carotid arteries are patent from skull base to carotid termini with evaluation for stenosis limited by motion artifact. ACAs and MCAs are patent without evidence of significant A1 or M1 stenosis. Branch vessel evaluation is limited by motion artifact, however no proximal branch occlusion is identified. No aneurysm is identified within limitations of motion. IMPRESSION: 1. Punctate acute left frontal and bilateral occipital infarcts. 2. Chronic left frontal, right parietal, and bilateral cerebellar infarcts. 3. Moderate chronic small vessel ischemic disease. 4. Motion degraded head MRA without major branch occlusion. Electronically Signed   By: Logan Bores M.D.   On: 02/06/2018 12:37   Mr Brain Wo Contrast  Result Date: 02/06/2018 CLINICAL DATA:  Syncope. EXAM: MRI HEAD WITHOUT CONTRAST MRA HEAD WITHOUT CONTRAST TECHNIQUE: Multiplanar, multiecho pulse sequences of the brain and surrounding structures were obtained without intravenous contrast. Angiographic images of the head were obtained using MRA technique without contrast. COMPARISON:  Head CT 02/05/2018 FINDINGS: MRI HEAD FINDINGS The study is mildly motion degraded. Brain: There punctate acute infarcts in the left middle frontal gyrus and both occipital lobes.  Chronic infarcts are noted in the left frontal lobe and right parietal lobe with a small amount of chronic blood products. Chronic infarcts are present in both cerebellar hemispheres. Patchy cerebral white matter T2 hyperintensities separate from the aforementioned chronic infarcts are nonspecific but compatible with moderate chronic small vessel ischemic disease. There is mild cerebral atrophy. No mass, midline shift, or extra-axial fluid collection is identified. 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 Vascular: Major intracranial vascular flow voids are preserved. Skull and upper cervical spine: Unremarkable bone marrow signal. Sinuses/Orbits: Unremarkable orbits. Paranasal sinuses and mastoid air cells are clear. Other: None. MRA HEAD FINDINGS The study is moderately motion degraded. The visualized distal vertebral arteries are patent to the basilar and codominant. The basilar artery is patent without evidence of significant stenosis allowing for motion artifact proximally. Patent SCA origins are identified bilaterally. There is a small right posterior communicating artery. The PCAs are patent with asymmetric attenuation of distal branch vessels  on the left but no evidence of significant proximal stenosis. The internal carotid arteries are patent from skull base to carotid termini with evaluation for stenosis limited by motion artifact. ACAs and MCAs are patent without evidence of significant A1 or M1 stenosis. Branch vessel evaluation is limited by motion artifact, however no proximal branch occlusion is identified. No aneurysm is identified within limitations of motion. IMPRESSION: 1. Punctate acute left frontal and bilateral occipital infarcts. 2. Chronic left frontal, right parietal, and bilateral cerebellar infarcts. 3. Moderate chronic small vessel ischemic disease. 4. Motion degraded head MRA without major branch  occlusion. Electronically Signed   By: Logan Bores M.D.   On: 02/06/2018 12:37   US Carotid Bilateral  Result Date: 02/06/2018 CLINICAL DATA:  Cerebral infarction.  Hypertension, syncope. EXAM: BILATERAL CAROTID DUPLEX ULTRASOUND TECHNIQUE: Pearline Cables scale imaging, color Doppler and duplex ultrasound were performed of bilateral carotid and vertebral arteries in the neck. COMPARISON:  None. FINDINGS: Criteria: Quantification of carotid stenosis is based on velocity parameters that correlate the residual internal carotid diameter with NASCET-based stenosis levels, using the diameter of the distal internal carotid lumen as the denominator for stenosis measurement. The following velocity measurements were obtained: RIGHT ICA: 91/33 cm/sec CCA: 32/12 cm/sec SYSTOLIC ICA/CCA RATIO:  0.0 ECA: 58 cm/sec LEFT ICA: 96/36 cm/sec CCA: 2482 cm/sec SYSTOLIC ICA/CCA RATIO:  1.3 ECA: 55 cm/sec RIGHT CAROTID ARTERY: Mild tortuosity. No significant plaque accumulation or stenosis. Normal waveforms and color Doppler signal. RIGHT VERTEBRAL ARTERY:  Normal flow direction and waveform. LEFT CAROTID ARTERY: Mild intimal thickening in the common carotid artery. Mild eccentric nonocclusive plaque in the bulb. Normal waveforms and color Doppler signal. Mild tortuosity of the ICA. LEFT VERTEBRAL ARTERY:  Normal flow direction and waveform. IMPRESSION: 1. Mild left carotid bifurcation plaque resulting in less than 50% diameter stenosis. 2.  Antegrade bilateral vertebral arterial flow. Electronically Signed   By: Lucrezia Europe M.D.   On: 02/06/2018 09:42   Dg Hip Unilat W Or Wo Pelvis 2-3 Views Left  Result Date: 02/05/2018 CLINICAL DATA:  Acute LEFT hip pain following fall today. Initial encounter. EXAM: DG HIP (WITH OR WITHOUT PELVIS) 2-3V LEFT COMPARISON:  11/08/2012 pelvic CT scout film FINDINGS: No acute fracture or dislocation. No focal bony lesions are identified. The joint spaces are unremarkable. Degenerative changes in the LOWER  lumbar spine are noted. IMPRESSION: No acute bony abnormality. Electronically Signed   By: Margarette Canada M.D.   On: 02/05/2018 16:39      Management plans discussed with the patient, family and they are in agreement.  CODE STATUS:     Code Status Orders  (From admission, onward)         Start     Ordered   02/05/18 2006  Full code  Continuous     02/05/18 2005        Code Status History    This patient has a current code status but no historical code status.      TOTAL TIME TAKING CARE OF THIS PATIENT: 35 minutes.    Loletha Grayer M.D on 02/07/2018 at 3:05 PM  Between 7am to 6pm - Pager - [phone]  After 6pm go to [email or web address] - Proofreader  [facility] [facility]  [phone]  CC: Primary care physician; Philis Nettle

## 2018-02-07 NOTE — Progress Notes (Signed)
Physical Therapy Treatment Patient Details Name: Kaylee [name] MRN: [id number] DOB: 10/10/1940 Today's Date: 02/07/2018    History of Present Illness 77 y/o female who had a LOC and fall with no seizing symptoms.  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      PT Comments    Pt ready for session.  Bed mobility without assist.  Stood and required min verbal cues throughout session for hand placement sand general safety. She was able to walk 2 laps around nursing unit with 1 seated rest.  She did not have any LOB's with gait today but remains with general unsteadiness at times.  She continues to require +1 assist for safety.  Son in and aware.   Follow Up Recommendations  Home health PT;Supervision for mobility/OOB     Equipment Recommendations  Rolling walker with 5" wheels    Recommendations for Other Services       Precautions / Restrictions Precautions Precautions: Fall Restrictions Weight Bearing Restrictions: No    Mobility  Bed Mobility Overal bed mobility: Modified Independent                Transfers Overall transfer level: Needs assistance Equipment used: Rolling walker (2 wheeled) Transfers: Sit to/from Stand Sit to Stand: Min guard         General transfer comment: verbal cues for hand placement  Ambulation/Gait Ambulation/Gait assistance: Min guard Gait Distance (Feet): 180 Feet Assistive device: Rolling walker (2 wheeled) Gait Pattern/deviations: Step-through pattern;Narrow base of support Gait velocity: decreased   General Gait Details: 200 x 2 with seated rest break.  Overall improved gait today but still require +1 assist for safety.  Son in and educated.    Stairs             Wheelchair Mobility    Modified Rankin (Stroke Patients Only)       Balance Overall balance assessment: Needs assistance Sitting-balance support: No upper extremity supported Sitting balance-Leahy  Scale: Good     Standing balance support: Bilateral upper extremity supported Standing balance-Leahy Scale: Poor                              Cognition Arousal/Alertness: Awake/alert Behavior During Therapy: WFL for tasks assessed/performed Overall Cognitive Status: History of cognitive impairments - at baseline                                        Exercises      General Comments        Pertinent Vitals/Pain Pain Assessment: No/denies pain    Home Living                      Prior Function            PT Goals (current goals can now be found in the care plan section) Progress towards PT goals: Progressing toward goals    Frequency    7X/week      PT Plan Current plan remains appropriate    Co-evaluation              AM-PAC PT "6 Clicks" Daily Activity  Outcome Measure  Difficulty turning over in bed (including adjusting bedclothes, sheets and blankets)?: None Difficulty moving from lying on back to sitting on the side of the  bed? : None Difficulty sitting down on and standing up from a chair with arms (e.g., wheelchair, bedside commode, etc,.)?: A Little Help needed moving to and from a bed to chair (including a wheelchair)?: A Little Help needed walking in hospital room?: A Little Help needed climbing 3-5 steps with a railing? : A Little 6 Click Score: 20    End of Session Equipment Utilized During Treatment: Gait belt Activity Tolerance: Patient tolerated treatment well Patient left: in bed;with bed alarm set;with call bell/phone within reach;with family/visitor present         Time: 0335-3317 PT Time Calculation (min) (ACUTE ONLY): 18 min  Charges:  $Gait Training: 8-22 mins                     Chesley Noon, PTA 02/07/18, 12:49 PM

## 2018-02-07 NOTE — Progress Notes (Signed)
Patient discharged home with son. Patient and son verbalized understanding of education.

## 2018-02-07 NOTE — Care Management Note (Signed)
Case Management Note  Patient Details  Name: Kaylee [name] MRN: [id number] Date of Birth: 12-02-1940  Subjective/Objective:   Patient to be discharged per MD order. Orders in place for home health services. Patient had previously been worked up for home health via [employer], patient agreeable to this plan. Referral confirmed with Malachy Mood from [facility] for PT/RN. DME rolling walker ordered, obtained from [facility] and delivered to the room. Family to provide transport, no further needs.                  Action/Plan:   Expected Discharge Date:  02/07/18               Expected Discharge Plan:  [place]  In-House Referral:     Discharge planning Services  CM Consult  Post Acute Care Choice:  Home Health, Durable Medical Equipment Choice offered to:  Patient  DME Arranged:  Walker rolling DME Agency:  [place] Arranged:  PT, RN [facility] Agency:  [place]  Status of Service:  Completed, signed off  If discussed at [place] of Stay Meetings, dates discussed:    Additional Comments:  Latanya Maudlin, RN 02/07/2018, 12:46 PM

## 2018-02-07 NOTE — Plan of Care (Signed)
  Problem: Education: Goal: Knowledge of General Education information will improve Description Including pain rating scale, medication(s)/side effects and non-pharmacologic comfort measures Outcome: Progressing   Problem: Health Behavior/Discharge Planning: Goal: Ability to manage health-related needs will improve Outcome: Progressing   Problem: Clinical Measurements: Goal: Ability to maintain clinical measurements within normal limits will improve Outcome: Progressing Goal: Will remain free from infection Outcome: Progressing Goal: Diagnostic test results will improve Outcome: Progressing Goal: Respiratory complications will improve Outcome: Progressing Goal: Cardiovascular complication will be avoided Outcome: Progressing   Problem: Coping: Goal: Level of anxiety will decrease Outcome: Progressing   Problem: Elimination: Goal: Will not experience complications related to bowel motility Outcome: Progressing Goal: Will not experience complications related to urinary retention Outcome: Progressing   Problem: Pain Managment: Goal: General experience of comfort will improve Outcome: Progressing   Problem: Pain Managment: Goal: General experience of comfort will improve Outcome: Progressing   Problem: Safety: Goal: Ability to remain free from injury will improve Outcome: Progressing   Problem: Skin Integrity: Goal: Risk for impaired skin integrity will decrease Outcome: Progressing   Problem: Education: Goal: Knowledge of disease or condition will improve Outcome: Progressing Goal: Knowledge of secondary prevention will improve Outcome: Progressing Goal: Knowledge of patient specific risk factors addressed and post discharge goals established will improve Outcome: Progressing   Problem: Coping: Goal: Will verbalize positive feelings about self Outcome: Progressing Goal: Will identify appropriate support needs Outcome: Progressing   Problem: Health  Behavior/Discharge Planning: Goal: Ability to manage health-related needs will improve Outcome: Progressing   Problem: Self-Care: Goal: Ability to participate in self-care as condition permits will improve Outcome: Progressing Goal: Verbalization of feelings and concerns over difficulty with self-care will improve Outcome: Progressing   Problem: Nutrition: Goal: Dietary intake will improve Outcome: Progressing   Problem: Intracerebral Hemorrhage Tissue Perfusion: Goal: Complications of Intracerebral Hemorrhage will be minimized Outcome: Progressing   Problem: Ischemic Stroke/TIA Tissue Perfusion: Goal: Complications of ischemic stroke/TIA will be minimized Outcome: Progressing

## 2018-02-07 NOTE — Evaluation (Signed)
Occupational Therapy Evaluation Patient Details Name: Kaylee [name] MRN: [id number] DOB: 05-19-1940 Today's Date: 02/07/2018    History of Present Illness 77 y/o female who had a LOC and fall with no seizing symptoms.  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     Clinical Impression   Patient was lying in bed, with son at bedside, when OT arrived. Eager to participate in evaluation. Patient noted to have confusion, and needed son to correct a few facts. Son states this is baseline for patient. PLOF patient was performing self care, and had sister helping with cooking and cleaning. Sister also provides supervision as needed. Patient required SBA for bed mobility, but required CGA/MIN A for sit/stand and balance. Posterior lean noted. Patient was able to manage socks from bedside safely, but will require assist and verbal cues for standing while performing ADL tasks. Son educated on safety needs, and use of walker at this time for balance. Patient would benefit from continued OT services at this time to work on balance and safety with ADL tasks.     Follow Up Recommendations  No OT follow up    Equipment Recommendations       Recommendations for Other Services       Precautions / Restrictions Precautions Precautions: Fall Restrictions Weight Bearing Restrictions: No      Mobility Bed Mobility Overal bed mobility: Modified Independent             General bed mobility comments: Pt able to get to sitting EOB w/o hesitation  Transfers Overall transfer level: Needs assistance Equipment used: Rolling walker (2 wheeled) Transfers: Sit to/from Stand Sit to Stand: Min guard;Min assist         General transfer comment: verbal cues for hand placement    Balance Overall balance assessment: Needs assistance Sitting-balance support: No upper extremity supported Sitting balance-Leahy Scale: Good     Standing balance support:  Bilateral upper extremity supported Standing balance-Leahy Scale: Poor                             ADL either performed or assessed with clinical judgement   ADL Overall ADL's : Needs assistance/impaired Eating/Feeding: Independent               Upper Body Dressing : Set up;Supervision/safety   Lower Body Dressing: Min guard;Minimal assistance;Supervision/safety   Toilet Transfer: Min guard;Minimal assistance;Supervision/safety           Functional mobility during ADLs: Min guard;Minimal assistance;Caregiver able to provide necessary level of assistance General ADL Comments: Decreased balance requiring CGA to MIN A with functional tasks.     Vision Baseline Vision/History: No visual deficits Patient Visual Report: No change from baseline       Perception     Praxis      Pertinent Vitals/Pain Pain Assessment: No/denies pain     Hand Dominance Right   Extremity/Trunk Assessment Upper Extremity Assessment Upper Extremity Assessment: Generalized weakness;Overall [facility] for tasks assessed   Lower Extremity Assessment Lower Extremity Assessment: Defer to PT evaluation       Communication Communication Communication: No difficulties   Cognition Arousal/Alertness: Awake/alert Behavior During Therapy: WFL for tasks assessed/performed Overall Cognitive Status: History of cognitive impairments - at baseline  General Comments: Pt with poor general awareness and needing repeated cuing for most tasks, family reports that this is close to baseline    General Comments       Exercises     Shoulder Instructions      Home Living Family/patient expects to be discharged to:: Private residence Living Arrangements: Other relatives(sister) Available Help at Discharge: Family Type of Home: House Home Access: Stairs to enter Technical brewer of Steps: 4 Entrance Stairs-Rails: Can reach both Home Layout:  One level     Bathroom Shower/Tub: Teacher, early years/pre: Standard Bathroom Accessibility: Yes   Home Equipment: None          Prior Functioning/Environment Level of Independence: Independent        Comments: Pt able to perform dressing and toileting. Had limited self to only sponge bathing but able to perform independently. Sister does cooking and cleaning        OT Problem List: Decreased strength;Impaired balance (sitting and/or standing);Decreased cognition;Decreased safety awareness      OT Treatment/Interventions: Self-care/ADL training;Therapeutic exercise;Therapeutic activities;Patient/family education    OT Goals(Current goals can be found in the care plan section) Acute Rehab OT Goals Patient Stated Goal: go home OT Goal Formulation: With patient/family Time For Goal Achievement: 02/21/18 Potential to Achieve Goals: Good  OT Frequency: Min 2X/week   Barriers to D/C:            Co-evaluation              AM-PAC PT "6 Clicks" Daily Activity     Outcome Measure Help from another person eating meals?: None Help from another person taking care of personal grooming?: A Little Help from another person toileting, which includes using toliet, bedpan, or urinal?: A Little Help from another person bathing (including washing, rinsing, drying)?: A Little Help from another person to put on and taking off regular upper body clothing?: A Little Help from another person to put on and taking off regular lower body clothing?: A Little 6 Click Score: 19   End of Session Equipment Utilized During Treatment: Gait belt  Activity Tolerance: Patient tolerated treatment well Patient left: in bed;with bed alarm set;with call bell/phone within reach  OT Visit Diagnosis: Unsteadiness on feet (R26.81);Muscle weakness (generalized) (M62.81)                Time: 9244-6286 OT Time Calculation (min): 25 min Charges:  OT General Charges $OT Visit: 1 Visit OT  Evaluation $OT Eval Low Complexity: 1 Low OT Treatments $Self Care/Home Management : 8-22 mins  Amie [place], OTR/L  Kaylee [name] L 02/07/2018, 4:34 PM

## 2018-02-07 NOTE — Progress Notes (Addendum)
Back to normal self, following commands.    Past Medical History:  Diagnosis Date  . Thyroid disease     Family History  Problem Relation Age of Onset  . Dementia Mother   . Hypertension Mother   . Hypothyroidism Mother   . Prostate cancer Father    Social History:  reports that she has never smoked. She has never used smokeless tobacco. She reports that she drank alcohol. She reports that she has current or past drug history.  Allergies: No Known Allergies  Medications:  I have reviewed the patient's current medications. Prior to Admission:  Medications Prior to Admission  Medication Sig Dispense Refill Last Dose  . Multiple Vitamin (MULTIVITAMIN) tablet Take 1 tablet by mouth once a week.   Past Week at 0800   Scheduled: . aspirin EC  81 mg Oral Daily  . atorvastatin  40 mg Oral q1800  . enoxaparin (LOVENOX) injection  40 mg Subcutaneous Q24H  . multivitamin with minerals  1 tablet Oral Weekly     Physical Examination: Blood pressure (!) 156/89, pulse 66, temperature 98.3 F (36.8 C), temperature source Oral, resp. rate 16, height 5\' 5"  (1.651 m), weight 62.2 kg, SpO2 99 %.   HEENT-  Normocephalic, no lesions, without obvious abnormality.  Normal external eye and conjunctiva.  Normal TM's bilaterally.  Normal auditory canals and external ears. Normal external nose, mucus membranes and septum.  Normal pharynx. Cardiovascular- S1, S2 normal, pulses palpable throughout   Lungs- chest clear, no wheezing, rales, normal symmetric air entry Abdomen- soft, non-tender; bowel sounds normal; no masses,  no organomegaly Extremities- no edema Lymph-no adenopathy palpable Musculoskeletal-no joint tenderness, deformity or swelling Skin-warm and dry, no hyperpigmentation, vitiligo, or suspicious lesions  Neurological Exam   Mental Status: Alert, oriented, thought content appropriate.  Speech fluent without evidence of aphasia.  Able to follow 3 step commands without difficulty.  Attention span and concentration seemed appropriate  Cranial Nerves: II: Discs flat bilaterally; Visual fields grossly normal, pupils equal, round, reactive to light and accommodation III,IV, VI: ptosis not present, extra-ocular motions intact bilaterally V,VII: smile symmetric, facial light touch sensation intact VIII: hearing normal bilaterally IX,X: gag reflex present XI: bilateral shoulder shrug XII: midline tongue extension Motor: Right :  Upper extremity   5/5 Without pronator drift      Left: Upper extremity   5/5 without pronator drift Right:   Lower extremity   5/5                                          Left: Lower extremity   5/5 Tone and bulk:normal tone throughout; no atrophy noted Sensory: Pinprick and light touch intact bilaterally Deep Tendon Reflexes: 2+ and symmetric throughout Plantars: Right: mute                              Left: mute Cerebellar: Finger-to-nose testing intact bilaterally. Heel to shin testing normal bilaterally Gait: not tested due to safety concerns  Data Reviewed  Laboratory Studies:  Basic Metabolic Panel: Recent Labs  Lab 02/05/18 1412  NA 139  K 4.0  CL 105  CO2 26  GLUCOSE 146*  BUN 19  CREATININE 1.00  CALCIUM 9.3    Liver Function Tests: No results for input(s): AST, ALT, ALKPHOS, BILITOT, PROT, ALBUMIN in the last 168 hours. No  results for input(s): LIPASE, AMYLASE in the last 168 hours. No results for input(s): AMMONIA in the last 168 hours.  CBC: Recent Labs  Lab 02/05/18 1412  WBC 6.7  HGB 13.6  HCT 41.4  MCV 95.8  PLT 117*    Cardiac Enzymes: Recent Labs  Lab 02/05/18 1421  TROPONINI <0.03    BNP: Invalid input(s): POCBNP  CBG: No results for input(s): GLUCAP in the last 168 hours.  Microbiology: No results found for this or any previous visit.  Coagulation Studies: No results for input(s): LABPROT, INR in the last 72 hours.  Urinalysis:  Recent Labs  Lab 02/05/18 1515  COLORURINE YELLOW*   LABSPEC 1.015  PHURINE 7.0  GLUCOSEU NEGATIVE  HGBUR SMALL*  BILIRUBINUR NEGATIVE  KETONESUR 20*  PROTEINUR NEGATIVE  NITRITE NEGATIVE  LEUKOCYTESUR NEGATIVE    Lipid Panel:    Component Value Date/Time   CHOL 173 02/06/2018 0505   TRIG 33 02/06/2018 0505   HDL 63 02/06/2018 0505   CHOLHDL 2.7 02/06/2018 0505   VLDL 7 02/06/2018 0505   LDLCALC 103 (H) 02/06/2018 0505    HgbA1C:  Lab Results  Component Value Date   HGBA1C 5.2 02/06/2018    Urine Drug Screen:  No results found for: LABOPIA, COCAINSCRNUR, LABBENZ, AMPHETMU, THCU, LABBARB  Alcohol Level: No results for input(s): ETH in the last 168 hours.  Other results: EKG: normal EKG, normal sinus rhythm, unchanged from previous tracings.  Imaging: Ct Head Wo Contrast  Result Date: 02/05/2018 CLINICAL DATA:  Patient passed out and hit back of head.  No pain. EXAM: CT HEAD WITHOUT CONTRAST TECHNIQUE: Contiguous axial images were obtained from the base of the skull through the vertex without intravenous contrast. COMPARISON:  None. FINDINGS: Brain: No subdural, epidural, or subarachnoid hemorrhage. There is a lacunar infarct in the left cerebellar hemisphere. Cerebellum, brainstem, and basal cisterns are otherwise normal. Ventricles and sulci are prominent, consistent with volume loss. White matter changes noted. An infarct in the left frontal lobe is suspected to be nonacute. No other evidence of acute ischemia infarct. No mass effect or midline shift. Vascular: No hyperdense vessel or unexpected calcification. Skull: Normal. Negative for fracture or focal lesion. Sinuses/Orbits: No acute finding. Other: None. IMPRESSION: 1. 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 Chronic left cerebellar hemisphere lacunar infarct. No other evidence of acute abnormality. Electronically Signed   By: Dorise Bullion III M.D   On: 02/05/2018 16:20   [name] Head Wo Contrast  Result Date: 02/06/2018 CLINICAL DATA:  Syncope. EXAM: MRI  HEAD WITHOUT CONTRAST MRA HEAD WITHOUT CONTRAST TECHNIQUE: Multiplanar, multiecho pulse sequences of the brain and surrounding structures were obtained without intravenous contrast. Angiographic images of the head were obtained using MRA technique without contrast. COMPARISON:  Head CT 02/05/2018 FINDINGS: MRI HEAD FINDINGS The study is mildly motion degraded. Brain: There punctate acute infarcts in the left middle frontal gyrus and both occipital lobes. Chronic infarcts are noted in the left frontal lobe and right parietal lobe with a small amount of chronic blood products. Chronic infarcts are present in both cerebellar hemispheres. Patchy cerebral white matter T2 hyperintensities separate from the aforementioned chronic infarcts are nonspecific but compatible with moderate chronic small vessel ischemic disease. There is mild cerebral atrophy. No mass, midline shift, or extra-axial fluid collection is identified. 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 Vascular: Major intracranial vascular flow voids are preserved. Skull and  upper cervical spine: Unremarkable bone marrow signal. Sinuses/Orbits: Unremarkable orbits. Paranasal sinuses and mastoid air cells are clear. Other: None. MRA HEAD FINDINGS The study is moderately motion degraded. The visualized distal vertebral arteries are patent to the basilar and codominant. The basilar artery is patent without evidence of significant stenosis allowing for motion artifact proximally. Patent SCA origins are identified bilaterally. There is a small right posterior communicating artery. The PCAs are patent with asymmetric attenuation of distal branch vessels on the left but no evidence of significant proximal stenosis. The internal carotid arteries are patent from skull base to carotid termini with evaluation for stenosis limited by motion artifact. ACAs and MCAs are patent without  evidence of significant A1 or M1 stenosis. Branch vessel evaluation is limited by motion artifact, however no proximal branch occlusion is identified. No aneurysm is identified within limitations of motion. IMPRESSION: 1. Punctate acute left frontal and bilateral occipital infarcts. 2. Chronic left frontal, right parietal, and bilateral cerebellar infarcts. 3. Moderate chronic small vessel ischemic disease. 4. Motion degraded head MRA without major branch occlusion. Electronically Signed   By: Logan Bores M.D.   On: 02/06/2018 12:37   Mr Brain Wo Contrast  Result Date: 02/06/2018 CLINICAL DATA:  Syncope. EXAM: MRI HEAD WITHOUT CONTRAST MRA HEAD WITHOUT CONTRAST TECHNIQUE: Multiplanar, multiecho pulse sequences of the brain and surrounding structures were obtained without intravenous contrast. Angiographic images of the head were obtained using MRA technique without contrast. COMPARISON:  Head CT 02/05/2018 FINDINGS: MRI HEAD FINDINGS The study is mildly motion degraded. Brain: There punctate acute infarcts in the left middle frontal gyrus and both occipital lobes. Chronic infarcts are noted in the left frontal lobe and right parietal lobe with a small amount of chronic blood products. Chronic infarcts are present in both cerebellar hemispheres. Patchy cerebral white matter T2 hyperintensities separate from the aforementioned chronic infarcts are nonspecific but compatible with moderate chronic small vessel ischemic disease. There is mild cerebral atrophy. No mass, midline shift, or extra-axial fluid collection is identified. 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 Vascular: Major intracranial vascular flow voids are preserved. Skull and upper cervical spine: Unremarkable bone marrow signal. Sinuses/Orbits: Unremarkable orbits. Paranasal sinuses and mastoid air cells are clear. Other: None. MRA HEAD FINDINGS The study is  moderately motion degraded. The visualized distal vertebral arteries are patent to the basilar and codominant. The basilar artery is patent without evidence of significant stenosis allowing for motion artifact proximally. Patent SCA origins are identified bilaterally. There is a small right posterior communicating artery. The PCAs are patent with asymmetric attenuation of distal branch vessels on the left but no evidence of significant proximal stenosis. The internal carotid arteries are patent from skull base to carotid termini with evaluation for stenosis limited by motion artifact. ACAs and MCAs are patent without evidence of significant A1 or M1 stenosis. Branch vessel evaluation is limited by motion artifact, however no proximal branch occlusion is identified. No aneurysm is identified within limitations of motion. IMPRESSION: 1. Punctate acute left frontal and bilateral occipital infarcts. 2. Chronic left frontal, right parietal, and bilateral cerebellar infarcts. 3. Moderate chronic small vessel ischemic disease. 4. Motion degraded head MRA without major branch occlusion. Electronically Signed   By: Logan Bores M.D.   On: 02/06/2018 12:37   US Carotid Bilateral  Result Date: 02/06/2018 CLINICAL DATA:  Cerebral infarction.  Hypertension, syncope. EXAM: BILATERAL CAROTID DUPLEX ULTRASOUND TECHNIQUE: Pearline Cables scale imaging,  color Doppler and duplex ultrasound were performed of bilateral carotid and vertebral arteries in the neck. COMPARISON:  None. FINDINGS: Criteria: Quantification of carotid stenosis is based on velocity parameters that correlate the residual internal carotid diameter with NASCET-based stenosis levels, using the diameter of the distal internal carotid lumen as the denominator for stenosis measurement. The following velocity measurements were obtained: RIGHT ICA: 91/33 cm/sec CCA: 02/54 cm/sec SYSTOLIC ICA/CCA RATIO:  0.0 ECA: 58 cm/sec LEFT ICA: 96/36 cm/sec CCA: 2706 cm/sec SYSTOLIC ICA/CCA  RATIO:  1.3 ECA: 55 cm/sec RIGHT CAROTID ARTERY: Mild tortuosity. No significant plaque accumulation or stenosis. Normal waveforms and color Doppler signal. RIGHT VERTEBRAL ARTERY:  Normal flow direction and waveform. LEFT CAROTID ARTERY: Mild intimal thickening in the common carotid artery. Mild eccentric nonocclusive plaque in the bulb. Normal waveforms and color Doppler signal. Mild tortuosity of the ICA. LEFT VERTEBRAL ARTERY:  Normal flow direction and waveform. IMPRESSION: 1. Mild left carotid bifurcation plaque resulting in less than 50% diameter stenosis. 2.  Antegrade bilateral vertebral arterial flow. Electronically Signed   By: Lucrezia Europe M.D.   On: 02/06/2018 09:42   Dg Hip Unilat W Or Wo Pelvis 2-3 Views Left  Result Date: 02/05/2018 CLINICAL DATA:  Acute LEFT hip pain following fall today. Initial encounter. EXAM: DG HIP (WITH OR WITHOUT PELVIS) 2-3V LEFT COMPARISON:  11/08/2012 pelvic CT scout film FINDINGS: No acute fracture or dislocation. No focal bony lesions are identified. The joint spaces are unremarkable. Degenerative changes in the LOWER lumbar spine are noted. IMPRESSION: No acute bony abnormality. Electronically Signed   By: Margarette Canada M.D.   On: 02/05/2018 16:39    Assessment: 77 y.o. female with history of hypertension, hypothyroidism, and hyperlipidemia presenting to the ED with syncopal episode without premonitory symptoms. Initial CT head showed left frontal lobe infarct, follow-up MRI/MRA head reviewed and showed punctate acute left frontal and bilateral occipital infarcts without large vessel occlusion.  Concerns for embolic etiology. US carotids bilaterally did not show significant hemodynamically stenosis. EEG  Abnormal due to right anterior temporal sharp wave discharges.  LDL 103, hemoglobin A1c 5.2.  Patient was not taking a statin, anticoagulation or antiplatelet prior to this event.  Stroke Risk Factors - family history, hyperlipidemia and hypertension  Plan:  -  Incidental strokes seen on [facility] and more so on MRI brian that was done. 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

## 2018-02-07 NOTE — Plan of Care (Signed)
  Problem: Education: Goal: Knowledge of General Education information will improve Description Including pain rating scale, medication(s)/side effects and non-pharmacologic comfort measures Outcome: Progressing   Problem: Health Behavior/Discharge Planning: Goal: Ability to manage health-related needs will improve Outcome: Progressing   Problem: Clinical Measurements: Goal: Ability to maintain clinical measurements within normal limits will improve Outcome: Progressing Goal: Will remain free from infection Outcome: Progressing Goal: Diagnostic test results will improve Outcome: Progressing Goal: Respiratory complications will improve Outcome: Progressing Goal: Cardiovascular complication will be avoided Outcome: Progressing   Problem: Activity: Goal: Risk for activity intolerance will decrease Outcome: Progressing   Problem: Nutrition: Goal: Adequate nutrition will be maintained Outcome: Progressing   Problem: Coping: Goal: Level of anxiety will decrease Outcome: Progressing   Problem: Elimination: Goal: Will not experience complications related to bowel motility Outcome: Progressing Goal: Will not experience complications related to urinary retention Outcome: Progressing   Problem: Pain Managment: Goal: General experience of comfort will improve Outcome: Progressing   Problem: Safety: Goal: Ability to remain free from injury will improve Outcome: Progressing   Problem: Skin Integrity: Goal: Risk for impaired skin integrity will decrease Outcome: Progressing   Problem: Education: Goal: Knowledge of disease or condition will improve Outcome: Progressing Goal: Knowledge of secondary prevention will improve Outcome: Progressing Goal: Knowledge of patient specific risk factors addressed and post discharge goals established will improve Outcome: Progressing   Problem: Coping: Goal: Will verbalize positive feelings about self Outcome: Progressing Goal: Will  identify appropriate support needs Outcome: Progressing   Problem: Health Behavior/Discharge Planning: Goal: Ability to manage health-related needs will improve Outcome: Progressing   Problem: Self-Care: Goal: Ability to participate in self-care as condition permits will improve Outcome: Progressing Goal: Verbalization of feelings and concerns over difficulty with self-care will improve Outcome: Progressing   Problem: Nutrition: Goal: Dietary intake will improve Outcome: Progressing   Problem: Intracerebral Hemorrhage Tissue Perfusion: Goal: Complications of Intracerebral Hemorrhage will be minimized Outcome: Progressing   Problem: Ischemic Stroke/TIA Tissue Perfusion: Goal: Complications of ischemic stroke/TIA will be minimized Outcome: Progressing

## 2018-02-09 ENCOUNTER — Ambulatory Visit (INDEPENDENT_AMBULATORY_CARE_PROVIDER_SITE_OTHER): Payer: Medicare Other | Admitting: Family Medicine

## 2018-02-09 ENCOUNTER — Encounter: Payer: Self-pay | Admitting: Family Medicine

## 2018-02-09 VITALS — BP 138/86 | HR 81 | Temp 98.2°F | Ht 64.0 in | Wt 133.6 lb

## 2018-02-09 DIAGNOSIS — E039 Hypothyroidism, unspecified: Secondary | ICD-10-CM | POA: Diagnosis not present

## 2018-02-09 DIAGNOSIS — I10 Essential (primary) hypertension: Secondary | ICD-10-CM | POA: Diagnosis not present

## 2018-02-09 DIAGNOSIS — I63423 Cerebral infarction due to embolism of bilateral anterior cerebral arteries: Secondary | ICD-10-CM | POA: Diagnosis not present

## 2018-02-09 DIAGNOSIS — D696 Thrombocytopenia, unspecified: Secondary | ICD-10-CM

## 2018-02-09 DIAGNOSIS — E785 Hyperlipidemia, unspecified: Secondary | ICD-10-CM

## 2018-02-09 DIAGNOSIS — Z23 Encounter for immunization: Secondary | ICD-10-CM | POA: Diagnosis not present

## 2018-02-09 NOTE — Progress Notes (Signed)
Subjective:    Patient ID: Kaylee [name], female    DOB: 05/29/1940, 77 y.o.   MRN: [id number]  HPI   Patient presents to clinic as a new patient and for hospital follow-up.  She she was admitted from 02/05/2018 to 02/07/2018 due to fall and diagnosed with cerebrovascular accident.  Discharge summary copied from inpatient chart: ADMISSION DIAGNOSIS:  Abnormal CT scan [R93.89] Fall, initial encounter [W19.XXXA]  DISCHARGE DIAGNOSIS:  Active Problems:   CVA (cerebral vascular accident) ([place])   SECONDARY DIAGNOSIS:       Past Medical History:  Diagnosis Date  . Thyroid disease     HOSPITAL COURSE:   1.  Acute CVA involving bilateral occipital areas and left frontal area.  This could be embolic in nature.  Patient was in normal sinus rhythm during the hospital course.  Case discussed with Dr. Irish Elders neurology and he is okay with aspirin and Lipitor for stroke prevention.  He was also okay with discharge today and follow-up as outpatient with cardiology for potential TEE and Linq or Holter monitor.  This was explained to the son and patient that follow-up is going to be key here.  Referred to Dr. Rockey Situ as outpatient for follow-up. 2.  Syncope.  Likely secondary to stroke 3.  Essential hypertension.  Allow permissive hypertension at this point. 4.  Hyperlipidemia unspecified.  LDL 103.  Goal less than 70 5.  TSH 5.07 6.  Home health set up. 7.  Thrombocytopenia.  Recheck CBC in a few weeks.  Recommend checking hepatitis C profile at that time  DISCHARGE CONDITIONS:   Satisfactory  CONSULTS OBTAINED:  Treatment Team:  Leotis Pain, MD  DRUG ALLERGIES:  No Known Allergies  DISCHARGE MEDICATIONS:   Allergies as of 02/07/2018   No Known Allergies              Medication List    TAKE these medications       aspirin 81 MG EC tablet Take 1 tablet (81 mg total) by mouth daily. Start taking on:  02/08/2018   atorvastatin 40 MG  tablet Commonly known as:  LIPITOR Take 1 tablet (40 mg total) by mouth daily at 6 PM.   multivitamin tablet Take 1 tablet by mouth once a week.    Imaging done in hospital also reviewed including MRI of head brain, carotid Doppler, x-ray of hips and CT of head.  Son states they have not yet been contacted about any follow-up appointments with cardiology and neurology.  Son also states home health care has not yet contacted them either.  Patient is currently using a wheeled walker with success to help her get around.  Patient has been tolerating aspirin and pravastatin well so far.  Blood pressure has remained stable, patient was not started on any blood pressure medication in hospital due to neurology wanting to allow permissive hypertension at this time.  TSH was slightly elevated at 5.07 and platelet count was low, we will plan to monitor these things with her next lab work in a few weeks.  Overall patient states she is feeling pretty well since being discharged from hospital.  She is eating and drinking normally, using the bathroom normally.  Patient Active Problem List   Diagnosis Date Noted  . CVA (cerebral vascular accident) ([place]) 02/05/2018   Social History   Tobacco Use  . Smoking status: Never Smoker  . Smokeless tobacco: Never Used  Substance Use Topics  . Alcohol use: Not Currently   History  reviewed. No pertinent surgical history. Family History  Problem Relation Age of Onset  . Dementia Mother   . Hypertension Mother   . Hypothyroidism Mother   . Prostate cancer Father    Review of Systems  Constitutional: Negative for chills, fatigue and fever.  HENT: Negative for congestion, ear pain, sinus pain and sore throat.   Eyes: Negative.   Respiratory: Negative for cough, shortness of breath and wheezing.   Cardiovascular: Negative for chest pain, palpitations and leg swelling.  Gastrointestinal: Negative for abdominal pain, diarrhea, nausea and vomiting.   Genitourinary: Negative for dysuria, frequency and urgency.  Musculoskeletal: Negative for arthralgias and myalgias.  Skin: Negative for color change, pallor and rash.  Neurological: CVA, syncope (due to cva) Psychiatric/Behavioral: The patient is not nervous/anxious.       Objective:   Physical Exam  Constitutional: She is oriented to person, place, and time. No distress.  Elderly female  HENT:  Head: Normocephalic and atraumatic.  Eyes: Conjunctivae and EOM are normal. No scleral icterus.  Neck: Neck supple. No tracheal deviation present.  Cardiovascular: Normal rate and regular rhythm.  Pulmonary/Chest: Effort normal and breath sounds normal. No respiratory distress.  Musculoskeletal: She exhibits no edema.  Walking well with wheeled walker.   Neurological: She is alert and oriented to person, place, and time.  Skin: Skin is warm and dry. No erythema. No pallor.  Psychiatric: She has a normal mood and affect. Her behavior is normal.  Nursing note and vitals reviewed.     Vitals:   02/09/18 1328  BP: 138/86  Pulse: 81  Temp: 98.2 F (36.8 C)  SpO2: 99%   Assessment & Plan:   CVA-new referrals for cardiology and neurology placed due to patient not yet hearing about appointments from referrals placed during hospital admission.  Patient will see the specialist in follow-up for further testing.  Essential hypertension-blood pressure stable at this time without medication.  Hypothyroidism-TSH slightly elevated.  We will plan to recheck thyroid panel with next lab work.  Hyperlipidemia-patient is tolerating pravastatin and baby aspirin well.  Thrombocytopenia-we will plan to repeat CBC with next lab work, future lab orders placed.  Flu vaccine given in clinic today.  New home health referral placed today in clinic.  Follow up here in 6 weeks for recheck. You will see cardiology and neurology before your next appt here.

## 2018-02-10 ENCOUNTER — Encounter: Payer: Self-pay | Admitting: Family Medicine

## 2018-02-10 DIAGNOSIS — R55 Syncope and collapse: Secondary | ICD-10-CM | POA: Diagnosis not present

## 2018-02-10 DIAGNOSIS — I10 Essential (primary) hypertension: Secondary | ICD-10-CM | POA: Insufficient documentation

## 2018-02-10 DIAGNOSIS — I69398 Other sequelae of cerebral infarction: Secondary | ICD-10-CM | POA: Diagnosis not present

## 2018-02-10 DIAGNOSIS — E785 Hyperlipidemia, unspecified: Secondary | ICD-10-CM | POA: Insufficient documentation

## 2018-02-10 DIAGNOSIS — M6281 Muscle weakness (generalized): Secondary | ICD-10-CM | POA: Diagnosis not present

## 2018-02-10 DIAGNOSIS — E039 Hypothyroidism, unspecified: Secondary | ICD-10-CM | POA: Insufficient documentation

## 2018-02-11 ENCOUNTER — Encounter: Payer: Self-pay | Admitting: Cardiovascular Disease

## 2018-02-11 ENCOUNTER — Ambulatory Visit (INDEPENDENT_AMBULATORY_CARE_PROVIDER_SITE_OTHER): Payer: Medicare Other | Admitting: Cardiovascular Disease

## 2018-02-11 VITALS — BP 142/80 | HR 66 | Ht 64.0 in | Wt 139.2 lb

## 2018-02-11 DIAGNOSIS — I63449 Cerebral infarction due to embolism of unspecified cerebellar artery: Secondary | ICD-10-CM | POA: Diagnosis not present

## 2018-02-11 DIAGNOSIS — I639 Cerebral infarction, unspecified: Secondary | ICD-10-CM | POA: Diagnosis not present

## 2018-02-11 DIAGNOSIS — I10 Essential (primary) hypertension: Secondary | ICD-10-CM | POA: Diagnosis not present

## 2018-02-11 DIAGNOSIS — E782 Mixed hyperlipidemia: Secondary | ICD-10-CM | POA: Diagnosis not present

## 2018-02-11 NOTE — Patient Instructions (Addendum)
Medication Instructions:  No changes  If you need a refill on your cardiac medications before your next appointment, please call your pharmacy.    Lab work: No new labs needed   If you have labs (blood work) drawn today and your tests are completely normal, you will receive your results only by: Marland Kitchen MyChart Message (if you have MyChart) OR . A paper copy in the mail If you have any lab test that is abnormal or we need to change your treatment, we will call you to review the results.   Testing/Procedures:  We will try to schedule a TEE/trans-esophageal echo for clot in the heart And a implantable loop monitor for atrial fib/CVA  You are scheduled for a TEE on ________________ with Dr.___________ Please arrive at the [facility] at _________ a.m. on the day of your procedure.  DIET INSTRUCTIONS:  Nothing to eat or drink after midnight except your medications with a      sip of water.         1) Labs: __________________  2) Medications:  YOU MAY TAKE ALL of your remaining medications with a small amount of water.  3) Must have a responsible person to drive you home.  4) Bring a current list of your medications and current insurance cards.    If you have any questions after you get home, please call the office at [phone]    Follow-Up: At [facility], you and your health needs are our priority.  As part of our continuing mission to provide you with exceptional heart care, we have created designated Provider Care Teams.  These Care Teams include your primary Cardiologist (physician) and Advanced Practice Providers (APPs -  Physician Assistants and Nurse Practitioners) who all work together to provide you with the care you need, when you need it.  . You will need a follow up appointment with Dr. Caryl Comes, wound check 2 weeks after placement (device nurse) .  Marland Kitchen Providers on your designated Care Team:   . Murray Hodgkins, NP . Christell Faith, PA-C . Marrianne Mood,  PA-C  Any Other Special Instructions Will Be Listed Below (If Applicable).  For educational health videos Log in to : [email or web address] Or : [email or web address], password : [email or web address]

## 2018-02-11 NOTE — Progress Notes (Signed)
Cardiology Office Note  Date:  02/11/2018   ID:  Kaylee Kaylee [name], DOB 1940-09-11, MRN [id number]  PCP:  Kaylee Green, FNP   Chief Complaint  Kaylee [name] presents with  . New Kaylee [name]    CVA. [place] not sure why Kaylee Kaylee [name] fell. Medications reviewed verbally.    HPI:  Kaylee Kaylee [name] is a 77 year old woman with past medical history of Hypothyroidism Hypertension Hyperlipidemia Who presented to hospital with syncope February 05, 2018 Who presents to Kaylee Kaylee [name] office by referral from Dr. Earleen Kaylee [name] for further work-up of her stroke, consideration of transesophageal echo and reveal placement  Kaylee Kaylee [name] presents today with 2 family members  notes indicating Kaylee Kaylee [name] was in her usual state of health 1 week ago finishing lunch when Kaylee Kaylee [name] fell backwards, eyes rolling, hit her head Was out for a few seconds then arousable, talking Was brought to Kaylee emergency room had CT scan head and MRI documenting new and old strokes  Head MRI discussed with her in detail 1. Punctate acute left frontal and bilateral occipital infarcts. 2. Chronic left frontal, right parietal, and bilateral cerebellar infarcts. 3. Moderate chronic small vessel ischemic disease.  Carotid ultrasound 1. Mild left carotid bifurcation plaque resulting in less than 50% diameter stenosis.  Echocardiogram with normal LV function EF 60%, no other abnormalities noted  EKG personally reviewed by myself on todays visit Shows normal sinus rhythm rate 69 bpm no significant ST or T wave changes   PMH:   has a past medical history of Depression, Fainting spell, Frequent headaches, Hyperlipidemia, Hypertension, Migraine, Stroke ([place]), Thyroid disease, and UTI (urinary tract infection).  PSH:   History reviewed. No pertinent surgical history.  Current Outpatient Medications  Medication Sig Dispense Refill  . aspirin EC 81 MG EC tablet Take 1 tablet (81 mg total) by mouth daily. 30 tablet 0  . atorvastatin (LIPITOR) 40 MG tablet  Take 1 tablet (40 mg total) by mouth daily at 6 PM. 30 tablet 0  . Multiple Vitamin (MULTIVITAMIN) tablet Take 1 tablet by mouth once a week.     No current facility-administered medications for this visit.      Allergies:   Kaylee [name].   Social History:  Kaylee Kaylee [name]  reports that Kaylee Kaylee [name] has never smoked. Kaylee Kaylee [name] has never used smokeless tobacco. Kaylee Kaylee [name] reports that Kaylee Kaylee [name] drank alcohol. Kaylee Kaylee [name] reports that Kaylee Kaylee [name] has current or past drug history.   Family History:   family history includes Dementia in her mother; Hypertension in her mother; Hypothyroidism in her mother; Prostate cancer in her father.    Review of Systems: Review of Systems  Constitutional: Negative.   Respiratory: Negative.   Cardiovascular: Negative.   Gastrointestinal: Negative.   Musculoskeletal: Negative.   Neurological: Negative.   Psychiatric/Behavioral: Negative.   All other systems reviewed and are negative.   PHYSICAL EXAM: VS:  BP (!) 142/80 (BP Location: Right Arm, Kaylee [name], Cuff Size: Normal)   Pulse 66   Ht 5\' 4"  (1.626 m)   Wt 139 lb 4 oz (63.2 kg)   BMI 23.90 kg/m  , BMI Body mass index is 23.9 kg/m. GEN: Well nourished, well developed, in no acute distress  HEENT: normal  Neck: no JVD, carotid bruits, or masses Cardiac: RRR; no murmurs, rubs, or gallops,no edema  Respiratory:  clear to auscultation bilaterally, normal work of breathing GI: soft, nontender, nondistended, + BS MS: no deformity or atrophy  Skin: warm and dry, no rash Neuro:  Strength and sensation are  intact Psych: euthymic mood, full affect   Recent Labs: 02/05/2018: BUN 19; Creatinine, Ser 1.00; Hemoglobin 13.6; Platelets 117; Potassium 4.0; Sodium 139; TSH 5.077    Lipid Panel Lab Results  Component Value Date   CHOL 173 02/06/2018   HDL 63 02/06/2018   LDLCALC 103 (H) 02/06/2018   TRIG 33 02/06/2018      Wt Readings from Last 3 Encounters:  02/11/18 139 lb 4 oz (63.2 kg)  02/09/18 133  lb 9.6 oz (60.6 kg)  02/06/18 137 lb 3.2 oz (62.2 kg)     ASSESSMENT AND PLAN:  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 ([place]) - Plan: EKG 12-Lead Multiple strokes noted on MRI, acute and chronic Concern for embolic phenomena MRI reviewed, carotid reviewed, echocardiogram reviewed We reviewed Kaylee notes from neurology from recent hospital stay They have requested transesophageal echo and loop monitor for paroxysmal arrhythmia/atrial fibrillation -We have discussed Kaylee procedures above, risk and benefit They are willing to proceed with Kaylee transesophageal echo and placement of reveal device Tentatively this will be scheduled for November 12 in Kaylee morning Of Kaylee family members with her today in agreement On aspirin.  Not on aspirin until recent hospitalization  Essential hypertension Recommend we monitor blood pressure closely, borderline elevated  Mixed hyperlipidemia Recommend Kaylee Kaylee [name] continue her Lipitor 40 daily  Disposition:   F/U with Dr. Caryl Comes after reveal device placement for wound check   Total encounter time more than 60 minutes  Greater than 50% was spent in counseling and coordination of care with Kaylee Kaylee [name]    Orders Placed This Encounter  Procedures  . EKG 12-Lead     Signed, Esmond Plants, M.D., Ph.D. 02/11/2018  [place], [place]

## 2018-02-12 ENCOUNTER — Telehealth: Payer: Self-pay | Admitting: Cardiovascular Disease

## 2018-02-12 DIAGNOSIS — E039 Hypothyroidism, unspecified: Secondary | ICD-10-CM | POA: Diagnosis not present

## 2018-02-12 DIAGNOSIS — I69398 Other sequelae of cerebral infarction: Secondary | ICD-10-CM | POA: Diagnosis not present

## 2018-02-12 DIAGNOSIS — I10 Essential (primary) hypertension: Secondary | ICD-10-CM | POA: Diagnosis not present

## 2018-02-12 DIAGNOSIS — M6281 Muscle weakness (generalized): Secondary | ICD-10-CM | POA: Diagnosis not present

## 2018-02-12 DIAGNOSIS — R55 Syncope and collapse: Secondary | ICD-10-CM | POA: Diagnosis not present

## 2018-02-12 NOTE — Telephone Encounter (Signed)
I left a message for scheduling to call. The patient saw Dr. Rockey Situ on 02/11/18 late PM.  She needs to be set up for a TEE w/ LINQ (loop recorder) implant. I left a message on the scheduling line that we are requesting:  Tuesday 11/12 7:30 am- TEE w/ Dr. Rockey Situ Followed by [facility] w/ Dr. Caryl Comes  Dx- stroke  Will need to call the patient family member, Kaylee [name] At [phone] to confirm.

## 2018-02-13 DIAGNOSIS — I10 Essential (primary) hypertension: Secondary | ICD-10-CM | POA: Diagnosis not present

## 2018-02-13 DIAGNOSIS — R55 Syncope and collapse: Secondary | ICD-10-CM | POA: Diagnosis not present

## 2018-02-13 DIAGNOSIS — M6281 Muscle weakness (generalized): Secondary | ICD-10-CM | POA: Diagnosis not present

## 2018-02-13 DIAGNOSIS — I69398 Other sequelae of cerebral infarction: Secondary | ICD-10-CM | POA: Diagnosis not present

## 2018-02-13 DIAGNOSIS — E039 Hypothyroidism, unspecified: Secondary | ICD-10-CM | POA: Diagnosis not present

## 2018-02-16 ENCOUNTER — Telehealth: Payer: Self-pay | Admitting: Internal Medicine

## 2018-02-16 DIAGNOSIS — I10 Essential (primary) hypertension: Secondary | ICD-10-CM | POA: Diagnosis not present

## 2018-02-16 DIAGNOSIS — R55 Syncope and collapse: Secondary | ICD-10-CM | POA: Diagnosis not present

## 2018-02-16 DIAGNOSIS — E039 Hypothyroidism, unspecified: Secondary | ICD-10-CM | POA: Diagnosis not present

## 2018-02-16 DIAGNOSIS — M6281 Muscle weakness (generalized): Secondary | ICD-10-CM | POA: Diagnosis not present

## 2018-02-16 DIAGNOSIS — I69398 Other sequelae of cerebral infarction: Secondary | ICD-10-CM | POA: Diagnosis not present

## 2018-02-16 NOTE — Telephone Encounter (Signed)
Patient is now scheduled for 03/10/18   Nothing else needed

## 2018-02-16 NOTE — Telephone Encounter (Signed)
I called and spoke with Delorise Shiner regarding the patient's TEE w/ +/- LINQ.  The below instructions were reviewed with Izora Gala and she verbalized understanding.  You are scheduled for a TEE w/ possible Loop Recorder on Tuesday  with Dr. Rockey Situ (TEE)/ Caryl Comes [facility])  Please arrive at the [place] [facility] at 6:30 a.m. on the day of your procedure.  DIET INSTRUCTIONS:  Nothing to eat or drink after midnight except your medications with a sip of water.         1) Labs: N/A  2) Medications:  YOU MAY TAKE ALL of your remaining medications with a small amount of water.  3) Must have a responsible person to drive you home.  4) Bring a current list of your medications and current insurance cards.    If you have any questions after you get home, please call the office at [phone]

## 2018-02-16 NOTE — Telephone Encounter (Signed)
-----   Message from Emily Filbert, RN sent at 02/16/2018 10:49 AM EST ----- Patient to have a loop recorder placed on 11/12- will need a 10-14 day wound check w/ the Device clinic after that.  Please call family member- Delorise Shiner at [phone] go schedule  Thanks!!

## 2018-02-18 ENCOUNTER — Ambulatory Visit: Payer: Medicare Other | Admitting: Internal Medicine

## 2018-02-18 DIAGNOSIS — I69398 Other sequelae of cerebral infarction: Secondary | ICD-10-CM | POA: Diagnosis not present

## 2018-02-18 DIAGNOSIS — R55 Syncope and collapse: Secondary | ICD-10-CM | POA: Diagnosis not present

## 2018-02-18 DIAGNOSIS — E039 Hypothyroidism, unspecified: Secondary | ICD-10-CM | POA: Diagnosis not present

## 2018-02-18 DIAGNOSIS — M6281 Muscle weakness (generalized): Secondary | ICD-10-CM | POA: Diagnosis not present

## 2018-02-18 DIAGNOSIS — I10 Essential (primary) hypertension: Secondary | ICD-10-CM | POA: Diagnosis not present

## 2018-02-19 DIAGNOSIS — I69398 Other sequelae of cerebral infarction: Secondary | ICD-10-CM | POA: Diagnosis not present

## 2018-02-19 DIAGNOSIS — R55 Syncope and collapse: Secondary | ICD-10-CM | POA: Diagnosis not present

## 2018-02-19 DIAGNOSIS — I10 Essential (primary) hypertension: Secondary | ICD-10-CM | POA: Diagnosis not present

## 2018-02-19 DIAGNOSIS — E039 Hypothyroidism, unspecified: Secondary | ICD-10-CM | POA: Diagnosis not present

## 2018-02-19 DIAGNOSIS — M6281 Muscle weakness (generalized): Secondary | ICD-10-CM | POA: Diagnosis not present

## 2018-02-23 ENCOUNTER — Other Ambulatory Visit: Payer: Self-pay | Admitting: Cardiovascular Disease

## 2018-02-23 DIAGNOSIS — I10 Essential (primary) hypertension: Secondary | ICD-10-CM | POA: Diagnosis not present

## 2018-02-23 DIAGNOSIS — E039 Hypothyroidism, unspecified: Secondary | ICD-10-CM | POA: Diagnosis not present

## 2018-02-23 DIAGNOSIS — M6281 Muscle weakness (generalized): Secondary | ICD-10-CM | POA: Diagnosis not present

## 2018-02-23 DIAGNOSIS — I69398 Other sequelae of cerebral infarction: Secondary | ICD-10-CM | POA: Diagnosis not present

## 2018-02-23 DIAGNOSIS — R55 Syncope and collapse: Secondary | ICD-10-CM | POA: Diagnosis not present

## 2018-02-23 DIAGNOSIS — I63423 Cerebral infarction due to embolism of bilateral anterior cerebral arteries: Secondary | ICD-10-CM

## 2018-02-24 ENCOUNTER — Encounter
Admission: RE | Disposition: A | Discharge status: Home / Self Care | Payer: Self-pay | Source: Ambulatory Visit | Attending: Cardiovascular Disease

## 2018-02-24 ENCOUNTER — Ambulatory Visit
Admission: RE | Admit: 2018-02-24 | Discharge: 2018-02-24 | Disposition: A | Discharge status: Home / Self Care | Payer: Medicare Other | Source: Ambulatory Visit | Attending: Cardiovascular Disease | Admitting: Cardiovascular Disease

## 2018-02-24 ENCOUNTER — Encounter: Payer: Self-pay | Admitting: *Deleted

## 2018-02-24 ENCOUNTER — Ambulatory Visit (HOSPITAL_BASED_OUTPATIENT_CLINIC_OR_DEPARTMENT_OTHER)
Admission: RE | Admit: 2018-02-24 | Discharge: 2018-02-24 | Disposition: A | Discharge status: Home / Self Care | Payer: Medicare Other | Source: Ambulatory Visit | Attending: Cardiovascular Disease | Admitting: Cardiovascular Disease

## 2018-02-24 ENCOUNTER — Other Ambulatory Visit: Payer: Self-pay

## 2018-02-24 DIAGNOSIS — Z87440 Personal history of urinary (tract) infections: Secondary | ICD-10-CM | POA: Diagnosis not present

## 2018-02-24 DIAGNOSIS — I639 Cerebral infarction, unspecified: Secondary | ICD-10-CM | POA: Diagnosis not present

## 2018-02-24 DIAGNOSIS — Z79899 Other long term (current) drug therapy: Secondary | ICD-10-CM | POA: Diagnosis not present

## 2018-02-24 DIAGNOSIS — I6389 Other cerebral infarction: Secondary | ICD-10-CM

## 2018-02-24 DIAGNOSIS — Z8349 Family history of other endocrine, nutritional and metabolic diseases: Secondary | ICD-10-CM | POA: Diagnosis not present

## 2018-02-24 DIAGNOSIS — R55 Syncope and collapse: Secondary | ICD-10-CM | POA: Diagnosis not present

## 2018-02-24 DIAGNOSIS — E785 Hyperlipidemia, unspecified: Secondary | ICD-10-CM | POA: Insufficient documentation

## 2018-02-24 DIAGNOSIS — E079 Disorder of thyroid, unspecified: Secondary | ICD-10-CM | POA: Diagnosis not present

## 2018-02-24 DIAGNOSIS — I10 Essential (primary) hypertension: Secondary | ICD-10-CM | POA: Insufficient documentation

## 2018-02-24 DIAGNOSIS — Z7982 Long term (current) use of aspirin: Secondary | ICD-10-CM | POA: Insufficient documentation

## 2018-02-24 DIAGNOSIS — Z8249 Family history of ischemic heart disease and other diseases of the circulatory system: Secondary | ICD-10-CM | POA: Diagnosis not present

## 2018-02-24 DIAGNOSIS — I63423 Cerebral infarction due to embolism of bilateral anterior cerebral arteries: Secondary | ICD-10-CM

## 2018-02-24 HISTORY — PX: TEE WITHOUT CARDIOVERSION: SHX5443

## 2018-02-24 HISTORY — PX: LOOP RECORDER INSERTION: EP1214

## 2018-02-24 SURGERY — ECHOCARDIOGRAM, TRANSESOPHAGEAL
Anesthesia: Moderate Sedation

## 2018-02-24 SURGERY — LOOP RECORDER INSERTION
Anesthesia: LOCAL

## 2018-02-24 MED ORDER — LIDOCAINE-EPINEPHRINE (PF) 1 %-1:200000 IJ SOLN
INTRAMUSCULAR | Status: AC
  Filled 2018-02-24: qty 30

## 2018-02-24 MED ORDER — LIDOCAINE VISCOUS HCL 2 % MT SOLN
OROMUCOSAL | Status: AC
  Administered 2018-02-24: 15 mL
  Filled 2018-02-24: qty 15

## 2018-02-24 MED ORDER — MIDAZOLAM HCL 5 MG/5ML IJ SOLN
INTRAMUSCULAR | Status: AC
  Filled 2018-02-24: qty 5

## 2018-02-24 MED ORDER — SODIUM CHLORIDE 0.9 % IV SOLN
INTRAVENOUS | Status: DC
  Administered 2018-02-24: 07:00:00 via INTRAVENOUS

## 2018-02-24 MED ORDER — BUTAMBEN-TETRACAINE-BENZOCAINE 2-2-14 % EX AERO
INHALATION_SPRAY | CUTANEOUS | Status: AC
  Administered 2018-02-24: 08:00:00
  Filled 2018-02-24: qty 5

## 2018-02-24 MED ORDER — MIDAZOLAM HCL 5 MG/5ML IJ SOLN
INTRAMUSCULAR | Status: AC | PRN
  Administered 2018-02-24 (×4): 1 mg via INTRAVENOUS

## 2018-02-24 MED ORDER — FENTANYL CITRATE (PF) 100 MCG/2ML IJ SOLN
INTRAMUSCULAR | Status: AC
  Filled 2018-02-24: qty 2

## 2018-02-24 MED ORDER — FENTANYL CITRATE (PF) 100 MCG/2ML IJ SOLN
INTRAMUSCULAR | Status: AC | PRN
  Administered 2018-02-24 (×4): 25 ug via INTRAVENOUS

## 2018-02-24 MED ORDER — LIDOCAINE-EPINEPHRINE (PF) 1 %-1:200000 IJ SOLN
INTRAMUSCULAR | Status: DC | PRN
  Administered 2018-02-24: 20 mL

## 2018-02-24 SURGICAL SUPPLY — 2 items
LOOP REVEAL LINQSYS (Prosthesis & Implant Heart) ×2 IMPLANT
PACK LOOP INSERTION (CUSTOM PROCEDURE TRAY) ×2 IMPLANT

## 2018-02-24 NOTE — Procedures (Signed)
Transesophageal Echocardiogram :  Indication: Embolic strokes Requesting/ordering  physician: Rockey Situ  Procedure: Benzocaine spray x2 and 2 mls x 2 of viscous lidocaine were given orally to provide local anesthesia to the oropharynx. The patient was positioned supine on the left side, bite block provided. The patient was moderately sedated with the doses of versed and fentanyl as detailed below.  Using digital technique an omniplane probe was advanced into the distal esophagus without incident.   Moderate sedation: 1. Sedation used:  Versed: 3, Fentanyl: 75ug 2. Time administered:  7:30am   Time when patient started recovery:8:15 3. I was face to face during this time 31min  See report in EPIC  for complete details: In brief, transgastric imaging revealed normal LV function with no RWMAs and no mural apical thrombus.  .  Estimated ejection fraction was >55%.  Right sided cardiac chambers were normal with no evidence of pulmonary hypertension.  Imaging of the septum showed no ASD or VSD Bubble study was negative for shunt 2D and color flow confirmed no PFO  The LA was well visualized in orthogonal views.  There was no spontaneous contrast and no thrombus in the LA and LA appendage   The descending thoracic aorta and aortic arch had mild to moderate  mural aortic debris with no evidence of aneurysmal dilation or disection   Ida Rogue 02/24/2018 8:19 AM

## 2018-02-24 NOTE — Discharge Instructions (Signed)
Incision Care, Adult An incision is a surgical cut that is made through your skin. Most incisions are closed after surgery. Your incision may be closed with stitches (sutures), staples, skin glue, or adhesive strips. You may need to return to your health care provider to have sutures or staples removed. This may occur several days to several weeks after your surgery. The incision needs to be cared for properly to prevent infection. How to care for your incision Incision care  Follow instructions from your health care provider about how to take care of your incision. Make sure you: Wash your hands with soap and water before you change the bandage (dressing). If soap and water are not available, use hand sanitizer. Change your dressing as told by your health care provider. Leave sutures, skin glue, or adhesive strips in place. These skin closures may need to stay in place for 2 weeks or longer. If adhesive strip edges start to loosen and curl up, you may trim the loose edges. Do not remove adhesive strips completely unless your health care provider tells you to do that. Check your incision area every day for signs of infection. Check for: More redness, swelling, or pain. More fluid or blood. Warmth. Pus or a bad smell. Ask your health care provider how to clean the incision. This may include: Using mild soap and water. Using a clean towel to pat the incision dry after cleaning it. Applying a cream or ointment. Do this only as told by your health care provider. Covering the incision with a clean dressing. Ask your health care provider when you can leave the incision uncovered. Do not take baths, swim, or use a hot tub until your health care provider approves. Ask your health care provider if you can take showers. You may only be allowed to take sponge baths for bathing. Medicines If you were prescribed an antibiotic medicine, cream, or ointment, take or apply the antibiotic as told by your health  care provider. Do not stop taking or applying the antibiotic even if your condition improves. Take over-the-counter and prescription medicines only as told by your health care provider. General instructions Limit movement around your incision to improve healing. Avoid straining, lifting, or exercise for the first month, or for as long as told by your health care provider. Follow instructions from your health care provider about returning to your normal activities. Ask your health care provider what activities are safe. Protect your incision from the sun when you are outside for the first 6 months, or for as long as told by your health care provider. Apply sunscreen around the scar or cover it up. Keep all follow-up visits as told by your health care provider. This is important. Contact a health care provider if: Your have more redness, swelling, or pain around the incision. You have more fluid or blood coming from the incision. Your incision feels warm to the touch. You have pus or a bad smell coming from the incision. You have a fever or shaking chills. You are nauseous or you vomit. You are dizzy. Your sutures or staples come undone. Get help right away if: You have a red streak coming from your incision. Your incision bleeds through the dressing and the bleeding does not stop with gentle pressure. The edges of your incision open up and separate. You have severe pain. You have a rash. You are confused. You faint. You have trouble breathing and a fast heartbeat. This information is not intended to replace advice  given to you by your health care provider. Make sure you discuss any questions you have with your health care provider. Document Released: 10/19/2004 Document Revised: 12/08/2015 Document Reviewed: 10/18/2015 Elsevier Interactive Patient Education  2018 [place]. Moderate Conscious Sedation, Adult, Care After These instructions provide you with information about caring  for yourself after your procedure. Your health care provider may also give you more specific instructions. Your treatment has been planned according to current medical practices, but problems sometimes occur. Call your health care provider if you have any problems or questions after your procedure. What can I expect after the procedure? After your procedure, it is common: To feel sleepy for several hours. To feel clumsy and have poor balance for several hours. To have poor judgment for several hours. To vomit if you eat too soon.  Follow these instructions at home: For at least 24 hours after the procedure:  Do not: Participate in activities where you could fall or become injured. Drive. Use heavy machinery. Drink alcohol. Take sleeping pills or medicines that cause drowsiness. Make important decisions or sign legal documents. Take care of children on your own. Rest. Eating and drinking Follow the diet recommended by your health care provider. If you vomit: Drink water, juice, or soup when you can drink without vomiting. Make sure you have little or no nausea before eating solid foods. General instructions Have a responsible adult stay with you until you are awake and alert. Take over-the-counter and prescription medicines only as told by your health care provider. If you smoke, do not smoke without supervision. Keep all follow-up visits as told by your health care provider. This is important. Contact a health care provider if: You keep feeling nauseous or you keep vomiting. You feel light-headed. You develop a rash. You have a fever. Get help right away if: You have trouble breathing. This information is not intended to replace advice given to you by your health care provider. Make sure you discuss any questions you have with your health care provider. Document Released: 01/20/2013 Document Revised: 09/04/2015 Document Reviewed: 07/22/2015 Elsevier Interactive Patient  Education  2018 [employer]. Transesophageal Echocardiogram Transesophageal echocardiography (TEE) is a special type of test that produces images of the heart by using sound waves (echocardiogram). This type of echocardiography can obtain better images of the heart than standard echocardiography. TEE is done by passing a flexible tube down the esophagus. The heart is located in front of the esophagus. Because the heart and esophagus are close to one another, your health care provider can take very clear, detailed pictures of the heart via ultrasound waves. TEE may be done:  If your health care provider needs more information based on standard echocardiography findings.  If you had a stroke. This might have happened because a clot formed in your heart. TEE can visualize different areas of the heart and check for clots.  To check valve anatomy and function.  To check for infection on the inside of your heart (endocarditis).  To evaluate the dividing wall (septum) of the heart and presence of a hole that did not close after birth (patent foramen ovale or atrial septal defect).  To help diagnose a tear in the wall of the aorta (aortic dissection).  During cardiac valve surgery. This allows the surgeon to assess the valve repair before closing the chest.  During a variety of other cardiac procedures to guide positioning of catheters.  Sometimes before a cardioversion, which is a shock to convert heart rhythm  back to normal.  Tell a health care provider about:  Any allergies you have.  All medicines you are taking, including vitamins, herbs, eye drops, creams, and over-the-counter medicines.  Any problems you or family members have had with anesthetic medicines.  Any blood disorders you have.  Any surgeries you have had.  Any medical conditions you have.  Swallowing difficulties.  An esophageal obstruction. What are the risks? Generally, TEE is a safe procedure. However, as with  any procedure, complications can occur. Possible complications include an esophageal tear (rupture). What happens before the procedure?  Do not eat or drink for 6 hours before the procedure or as directed by your health care provider.  Arrange for someone to drive you home after the procedure. Do not drive yourself home. During the procedure, you will be given medicines that can continue to make you feel drowsy and can impair your reflexes.  An IV access tube will be started in the arm. What happens during the procedure?  A medicine to help you relax (sedative) will be given through the IV access tube.  A medicine may be sprayed or gargled to numb the back of the throat.  Your blood pressure, heart rate, and breathing (vital signs) will be monitored during the procedure.  The TEE probe is a long, flexible tube. The tip of the probe is placed into the back of the mouth, and you will be asked to swallow. This helps to pass the tip of the probe into the esophagus. Once the tip of the probe is in the correct area, your health care provider can take pictures of the heart.  TEE is usually not a painful procedure. You may feel the probe press against the back of the throat. The probe does not enter the trachea and does not affect your breathing. What happens after the procedure?  You will be in bed, resting, until you have fully returned to consciousness.  When you first awaken, your throat may feel slightly sore and will probably still feel numb. This will improve slowly over time.  You will not be allowed to eat or drink until it is clear that the numbness has improved.  Once you have been able to drink, urinate, and sit on the edge of the bed without feeling sick to your stomach (nausea) or dizzy, you may be cleared to go home.  You should have a friend or family member with you for the next 24 hours after your procedure. This information is not intended to replace advice given to you by  your health care provider. Make sure you discuss any questions you have with your health care provider. Document Released: 06/22/2002 Document Revised: 09/07/2015 Document Reviewed: 10/01/2012 Elsevier Interactive Patient Education  [employer].

## 2018-02-24 NOTE — Progress Notes (Signed)
*  PRELIMINARY RESULTS* Echocardiogram Echocardiogram Transesophageal has been performed.  Kaylee [name] 02/24/2018, 8:27 AM

## 2018-02-24 NOTE — H&P (Signed)
ELECTROPHYSIOLOGY CONSULT NOTE  Patient ID: Kaylee [name] MRN: [id number], DOB/AGE: August 13, 1940   Admit date: 02/24/2018 Date of Consult: 02/24/2018  Primary Physician: Jodelle Green, FNP Primary Cardiologist: TG Reason for Consultation: Cryptogenic stroke; recommendations regarding Implantable Loop Recorder  History of Present Illness  EP has been asked to evaluate Kaylee [name] for placement of an implantable loop recorder to monitor for atrial fibrillation by Dr Deidre Ala.  The patient was admitted on 02/05/2018 with syncope abrupt in onset and one evaluation found to have subacute CVA and old strokes .Marland Kitchen  Imaging demonstrated As above and as outline below.  she has undergone workup for stroke including echocardiogram and carotid dopplers.  The patient has been monitored on telemetry which has demonstrated sinus rhythm with no arrhythmias.  Inpatient stroke work-up completed with a TEE which was neg   Echocardiogram this admission demonstrated normal LV function .  Lab work is reviewed.  ECG >> sinus with narrow QRS   Prior to admission, the patient denies chest pain, shortness of breath, dizziness, palpitations, or syncope.  The pt will return to home   Past Medical History:  Diagnosis Date  . Depression   . Fainting spell   . Frequent headaches   . Hyperlipidemia   . Hypertension   . Migraine   . Stroke ([place])   . Thyroid disease   . UTI (urinary tract infection)      Surgical History: History reviewed. No pertinent surgical history.   Medications Prior to Admission  Medication Sig Dispense Refill Last Dose  . aspirin EC 81 MG EC tablet Take 1 tablet (81 mg total) by mouth daily. 30 tablet 0 02/23/2018 at Unknown time  . atorvastatin (LIPITOR) 40 MG tablet Take 1 tablet (40 mg total) by mouth daily at 6 PM. 30 tablet 0 02/23/2018 at Unknown time  . Multiple Vitamin (MULTIVITAMIN WITH MINERALS) TABS tablet Take 1 tablet by mouth daily.   02/23/2018 at Unknown time       Allergies: No Known Allergies  Social History   Socioeconomic History  . Marital status: Widowed    Spouse name: Not on file  . Number of children: Not on file  . Years of education: Not on file  . Highest education level: Not on file  Occupational History  . Not on file  Social Needs  . Financial resource strain: Not on file  . Food insecurity:    Worry: Not on file    Inability: Not on file  . Transportation needs:    Medical: Not on file    Non-medical: Not on file  Tobacco Use  . Smoking status: Never Smoker  . Smokeless tobacco: Never Used  Substance and Sexual Activity  . Alcohol use: Not Currently  . Drug use: Not Currently  . Sexual activity: Not Currently  Lifestyle  . Physical activity:    Days per week: Not on file    Minutes per session: Not on file  . Stress: Not on file  Relationships  . Social connections:    Talks on phone: Not on file    Gets together: Not on file    Attends religious service: Not on file    Active member of club or organization: Not on file    Attends meetings of clubs or organizations: Not on file    Relationship status: Not on file  . Intimate partner violence:    Fear of current or ex partner: Not on file  Emotionally abused: Not on file    Physically abused: Not on file    Forced sexual activity: Not on file  Other Topics Concern  . Not on file  Social History Narrative  . Not on file     Family History  Problem Relation Age of Onset  . Dementia Mother   . Hypertension Mother   . Hypothyroidism Mother   . Prostate cancer Father       Review of Systems: All other systems reviewed and are otherwise negative except as noted above.  Physical Exam: Vitals:   02/24/18 0815 02/24/18 0818 02/24/18 0830 02/24/18 0845  BP: (!) 87/57 (!) 91/56 99/61 (!) 101/59  Pulse: 62 65 65 61  Resp: 14 15 16 16   Temp:      TempSrc:      SpO2: 98% 97% 97% 99%  Weight:      Height:       Well developed and nourished in no  acute distress HENT normal Neck supple with JVP-flat Clear Regular rate and rhythm, no murmurs or gallops Abd-soft with active BS No Clubbing cyanosis edema Skin-warm and dry A & Oriented  Grossly normal sensory and motor function   Labs:   Lab Results  Component Value Date   WBC 6.7 02/05/2018   HGB 13.6 02/05/2018   HCT 41.4 02/05/2018   MCV 95.8 02/05/2018   PLT 117 (L) 02/05/2018   No results for input(s): NA, K, CL, CO2, BUN, CREATININE, CALCIUM, PROT, BILITOT, ALKPHOS, ALT, AST, GLUCOSE in the last 168 hours.  Invalid input(s): LABALBU   Radiology/Studies: Ct Head Wo Contrast  Result Date: 02/05/2018 CLINICAL DATA:  Patient passed out and hit back of head.  No pain. EXAM: CT HEAD WITHOUT CONTRAST TECHNIQUE: Contiguous axial images were obtained from the base of the skull through the vertex without intravenous contrast. COMPARISON:  None. FINDINGS: Brain: No subdural, epidural, or subarachnoid hemorrhage. There is a lacunar infarct in the left cerebellar hemisphere. Cerebellum, brainstem, and basal cisterns are otherwise normal. Ventricles and sulci are prominent, consistent with volume loss. White matter changes noted. An infarct in the left frontal lobe is suspected to be nonacute. No other evidence of acute ischemia infarct. No mass effect or midline shift. Vascular: No hyperdense vessel or unexpected calcification. Skull: Normal. Negative for fracture or focal lesion. Sinuses/Orbits: No acute finding. Other: None. IMPRESSION: 1. 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 Chronic left cerebellar hemisphere lacunar infarct. No other evidence of acute abnormality. Electronically Signed   By: Dorise Bullion III M.D   On: 02/05/2018 16:20   [name] Head Wo Contrast  Result Date: 02/06/2018 CLINICAL DATA:  Syncope. EXAM: MRI HEAD WITHOUT CONTRAST MRA HEAD WITHOUT CONTRAST TECHNIQUE: Multiplanar, multiecho pulse sequences of the brain and surrounding structures were obtained  without intravenous contrast. Angiographic images of the head were obtained using MRA technique without contrast. COMPARISON:  Head CT 02/05/2018 FINDINGS: MRI HEAD FINDINGS The study is mildly motion degraded. Brain: There punctate acute infarcts in the left middle frontal gyrus and both occipital lobes. Chronic infarcts are noted in the left frontal lobe and right parietal lobe with a small amount of chronic blood products. Chronic infarcts are present in both cerebellar hemispheres. Patchy cerebral white matter T2 hyperintensities separate from the aforementioned chronic infarcts are nonspecific but compatible with moderate chronic small vessel ischemic disease. There is mild cerebral atrophy. No mass, midline shift, or extra-axial fluid collection is identified. 2 cm extra-axial CSF space adjacent to  the falx in the right parieto-occipital region likely represents a small arachnoid cyst. Vascular: Major intracranial vascular flow voids are preserved. Skull and upper cervical spine: Unremarkable bone marrow signal. Sinuses/Orbits: Unremarkable orbits. Paranasal sinuses and mastoid air cells are clear. Other: None. MRA HEAD FINDINGS The study is moderately motion degraded. The visualized distal vertebral arteries are patent to the basilar and codominant. The basilar artery is patent without evidence of significant stenosis allowing for motion artifact proximally. Patent SCA origins are identified bilaterally. There is a small right posterior communicating artery. The PCAs are patent with asymmetric attenuation of distal branch vessels on the left but no evidence of significant proximal stenosis. The internal carotid arteries are patent from skull base to carotid termini with evaluation for stenosis limited by motion artifact. ACAs and MCAs are patent without evidence of significant A1 or M1 stenosis. Branch vessel evaluation is limited by motion artifact, however no proximal branch occlusion is identified. No  aneurysm is identified within limitations of motion. IMPRESSION: 1. Punctate acute left frontal and bilateral occipital infarcts. 2. Chronic left frontal, right parietal, and bilateral cerebellar infarcts. 3. Moderate chronic small vessel ischemic disease. 4. Motion degraded head MRA without major branch occlusion. Electronically Signed   By: Logan Bores M.D.   On: 02/06/2018 12:37   Mr Brain Wo Contrast  Result Date: 02/06/2018 CLINICAL DATA:  Syncope. EXAM: MRI HEAD WITHOUT CONTRAST MRA HEAD WITHOUT CONTRAST TECHNIQUE: Multiplanar, multiecho pulse sequences of the brain and surrounding structures were obtained without intravenous contrast. Angiographic images of the head were obtained using MRA technique without contrast. COMPARISON:  Head CT 02/05/2018 FINDINGS: MRI HEAD FINDINGS The study is mildly motion degraded. Brain: There punctate acute infarcts in the left middle frontal gyrus and both occipital lobes. Chronic infarcts are noted in the left frontal lobe and right parietal lobe with a small amount of chronic blood products. Chronic infarcts are present in both cerebellar hemispheres. Patchy cerebral white matter T2 hyperintensities separate from the aforementioned chronic infarcts are nonspecific but compatible with moderate chronic small vessel ischemic disease. There is mild cerebral atrophy. No mass, midline shift, or extra-axial fluid collection is identified. 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 Vascular: Major intracranial vascular flow voids are preserved. Skull and upper cervical spine: Unremarkable bone marrow signal. Sinuses/Orbits: Unremarkable orbits. Paranasal sinuses and mastoid air cells are clear. Other: None. MRA HEAD FINDINGS The study is moderately motion degraded. The visualized distal vertebral arteries are patent to the basilar and codominant. The basilar artery is patent without evidence  of significant stenosis allowing for motion artifact proximally. Patent SCA origins are identified bilaterally. There is a small right posterior communicating artery. The PCAs are patent with asymmetric attenuation of distal branch vessels on the left but no evidence of significant proximal stenosis. The internal carotid arteries are patent from skull base to carotid termini with evaluation for stenosis limited by motion artifact. ACAs and MCAs are patent without evidence of significant A1 or M1 stenosis. Branch vessel evaluation is limited by motion artifact, however no proximal branch occlusion is identified. No aneurysm is identified within limitations of motion. IMPRESSION: 1. Punctate acute left frontal and bilateral occipital infarcts. 2. Chronic left frontal, right parietal, and bilateral cerebellar infarcts. 3. Moderate chronic small vessel ischemic disease. 4. Motion degraded head MRA without major branch occlusion. Electronically Signed   By: Logan Bores M.D.   On: 02/06/2018 12:37   US  Carotid Bilateral  Result Date: 02/06/2018 CLINICAL DATA:  Cerebral infarction.  Hypertension, syncope. EXAM: BILATERAL CAROTID DUPLEX ULTRASOUND TECHNIQUE: Pearline Cables scale imaging, color Doppler and duplex ultrasound were performed of bilateral carotid and vertebral arteries in the neck. COMPARISON:  None. FINDINGS: Criteria: Quantification of carotid stenosis is based on velocity parameters that correlate the residual internal carotid diameter with NASCET-based stenosis levels, using the diameter of the distal internal carotid lumen as the denominator for stenosis measurement. The following velocity measurements were obtained: RIGHT ICA: 91/33 cm/sec CCA: 11/17 cm/sec SYSTOLIC ICA/CCA RATIO:  0.0 ECA: 58 cm/sec LEFT ICA: 96/36 cm/sec CCA: 3567 cm/sec SYSTOLIC ICA/CCA RATIO:  1.3 ECA: 55 cm/sec RIGHT CAROTID ARTERY: Mild tortuosity. No significant plaque accumulation or stenosis. Normal waveforms and color Doppler signal.  RIGHT VERTEBRAL ARTERY:  Normal flow direction and waveform. LEFT CAROTID ARTERY: Mild intimal thickening in the common carotid artery. Mild eccentric nonocclusive plaque in the bulb. Normal waveforms and color Doppler signal. Mild tortuosity of the ICA. LEFT VERTEBRAL ARTERY:  Normal flow direction and waveform. IMPRESSION: 1. Mild left carotid bifurcation plaque resulting in less than 50% diameter stenosis. 2.  Antegrade bilateral vertebral arterial flow. Electronically Signed   By: Lucrezia Europe M.D.   On: 02/06/2018 09:42   Dg Hip Unilat W Or Wo Pelvis 2-3 Views Left  Result Date: 02/05/2018 CLINICAL DATA:  Acute LEFT hip pain following fall today. Initial encounter. EXAM: DG HIP (WITH OR WITHOUT PELVIS) 2-3V LEFT COMPARISON:  11/08/2012 pelvic CT scout film FINDINGS: No acute fracture or dislocation. No focal bony lesions are identified. The joint spaces are unremarkable. Degenerative changes in the LOWER lumbar spine are noted. IMPRESSION: No acute bony abnormality. Electronically Signed   By: Margarette Canada M.D.   On: 02/05/2018 16:39    12-lead ECG As above  (personally reviewed) All prior EKG's in EPIC reviewed with no documented atrial fibrillation  Telemetry no afib (personally reviewed)  Assessment and Plan:  1. Cryptogenic stroke The patient presents with cryptogenic stroke.  The patient has a TEE planned for this AM.  I spoke at length with the patient about monitoring for afib with an implantable loop recorder.  Risks, benefits, and alteratives to implantable loop recorder were discussed with the patient today.   At this time, the patient is very clear in their decision to proceed with implantable loop recorder.   Wound care was reviewed with the patient (keep incision clean and dry for 3 days).  Wound check scheduled and entered in AVS.  Please call with questions.   Virl Axe, MD 02/24/2018 9:04 AM

## 2018-02-24 NOTE — Sedation Documentation (Signed)
O2 increased to 6L

## 2018-02-24 NOTE — Sedation Documentation (Signed)
Alexis placed in mouth per Dr. Rockey Situ request

## 2018-02-25 ENCOUNTER — Encounter: Payer: Self-pay | Admitting: Cardiovascular Disease

## 2018-02-25 DIAGNOSIS — E039 Hypothyroidism, unspecified: Secondary | ICD-10-CM | POA: Diagnosis not present

## 2018-02-25 DIAGNOSIS — I10 Essential (primary) hypertension: Secondary | ICD-10-CM | POA: Diagnosis not present

## 2018-02-25 DIAGNOSIS — M6281 Muscle weakness (generalized): Secondary | ICD-10-CM | POA: Diagnosis not present

## 2018-02-25 DIAGNOSIS — R55 Syncope and collapse: Secondary | ICD-10-CM | POA: Diagnosis not present

## 2018-02-25 DIAGNOSIS — I69398 Other sequelae of cerebral infarction: Secondary | ICD-10-CM | POA: Diagnosis not present

## 2018-02-26 DIAGNOSIS — R55 Syncope and collapse: Secondary | ICD-10-CM | POA: Diagnosis not present

## 2018-02-26 DIAGNOSIS — I69398 Other sequelae of cerebral infarction: Secondary | ICD-10-CM | POA: Diagnosis not present

## 2018-02-26 DIAGNOSIS — E039 Hypothyroidism, unspecified: Secondary | ICD-10-CM | POA: Diagnosis not present

## 2018-02-26 DIAGNOSIS — I10 Essential (primary) hypertension: Secondary | ICD-10-CM | POA: Diagnosis not present

## 2018-02-26 DIAGNOSIS — M6281 Muscle weakness (generalized): Secondary | ICD-10-CM | POA: Diagnosis not present

## 2018-02-27 DIAGNOSIS — R55 Syncope and collapse: Secondary | ICD-10-CM | POA: Diagnosis not present

## 2018-02-27 DIAGNOSIS — I69398 Other sequelae of cerebral infarction: Secondary | ICD-10-CM | POA: Diagnosis not present

## 2018-02-27 DIAGNOSIS — E039 Hypothyroidism, unspecified: Secondary | ICD-10-CM | POA: Diagnosis not present

## 2018-02-27 DIAGNOSIS — M6281 Muscle weakness (generalized): Secondary | ICD-10-CM | POA: Diagnosis not present

## 2018-02-27 DIAGNOSIS — I10 Essential (primary) hypertension: Secondary | ICD-10-CM | POA: Diagnosis not present

## 2018-03-02 ENCOUNTER — Telehealth: Payer: Self-pay | Admitting: Family Medicine

## 2018-03-02 NOTE — Telephone Encounter (Signed)
It was sent as urgent on 10/28 through [facility]. They could not tell me why they did not receive it. I have resent it to them for review.

## 2018-03-02 NOTE — Telephone Encounter (Signed)
Copied from [place] [phone]. Topic: Referral - Status >> Mar 02, 2018 12:52 PM Conception Chancy, NT wrote: Reason for CRM: patient son is calling and states a Urgent Referral was placed for Neurology on 02/09/18 and they have not heard from anyone to schedule. He would like a status update on this.

## 2018-03-03 DIAGNOSIS — M6281 Muscle weakness (generalized): Secondary | ICD-10-CM | POA: Diagnosis not present

## 2018-03-03 DIAGNOSIS — I69398 Other sequelae of cerebral infarction: Secondary | ICD-10-CM | POA: Diagnosis not present

## 2018-03-03 DIAGNOSIS — R55 Syncope and collapse: Secondary | ICD-10-CM | POA: Diagnosis not present

## 2018-03-03 DIAGNOSIS — E039 Hypothyroidism, unspecified: Secondary | ICD-10-CM | POA: Diagnosis not present

## 2018-03-03 DIAGNOSIS — I10 Essential (primary) hypertension: Secondary | ICD-10-CM | POA: Diagnosis not present

## 2018-03-04 DIAGNOSIS — I69398 Other sequelae of cerebral infarction: Secondary | ICD-10-CM | POA: Diagnosis not present

## 2018-03-04 DIAGNOSIS — R55 Syncope and collapse: Secondary | ICD-10-CM | POA: Diagnosis not present

## 2018-03-04 DIAGNOSIS — E039 Hypothyroidism, unspecified: Secondary | ICD-10-CM | POA: Diagnosis not present

## 2018-03-04 DIAGNOSIS — I10 Essential (primary) hypertension: Secondary | ICD-10-CM | POA: Diagnosis not present

## 2018-03-04 DIAGNOSIS — M6281 Muscle weakness (generalized): Secondary | ICD-10-CM | POA: Diagnosis not present

## 2018-03-05 DIAGNOSIS — I10 Essential (primary) hypertension: Secondary | ICD-10-CM | POA: Diagnosis not present

## 2018-03-05 DIAGNOSIS — E039 Hypothyroidism, unspecified: Secondary | ICD-10-CM | POA: Diagnosis not present

## 2018-03-05 DIAGNOSIS — I69398 Other sequelae of cerebral infarction: Secondary | ICD-10-CM | POA: Diagnosis not present

## 2018-03-05 DIAGNOSIS — M6281 Muscle weakness (generalized): Secondary | ICD-10-CM | POA: Diagnosis not present

## 2018-03-05 DIAGNOSIS — R55 Syncope and collapse: Secondary | ICD-10-CM | POA: Diagnosis not present

## 2018-03-10 ENCOUNTER — Ambulatory Visit (INDEPENDENT_AMBULATORY_CARE_PROVIDER_SITE_OTHER): Payer: Medicare Other | Admitting: *Deleted

## 2018-03-10 DIAGNOSIS — E039 Hypothyroidism, unspecified: Secondary | ICD-10-CM | POA: Diagnosis not present

## 2018-03-10 DIAGNOSIS — I69398 Other sequelae of cerebral infarction: Secondary | ICD-10-CM | POA: Diagnosis not present

## 2018-03-10 DIAGNOSIS — R55 Syncope and collapse: Secondary | ICD-10-CM | POA: Diagnosis not present

## 2018-03-10 DIAGNOSIS — I639 Cerebral infarction, unspecified: Secondary | ICD-10-CM

## 2018-03-10 DIAGNOSIS — M6281 Muscle weakness (generalized): Secondary | ICD-10-CM | POA: Diagnosis not present

## 2018-03-10 DIAGNOSIS — I10 Essential (primary) hypertension: Secondary | ICD-10-CM | POA: Diagnosis not present

## 2018-03-10 LAB — CUP PACEART INCLINIC DEVICE CHECK
Date Time Interrogation Session: 20191126171646
Implantable Pulse Generator Implant Date: 20191112

## 2018-03-10 NOTE — Progress Notes (Signed)
Wound check appointment. Steri-strips removed. Wound without redness or edema. Incision edges approximated, wound well healed. Battery status: Good. R-waves 0.48mV. 0 symptom episodes, 0 tachy episodes, 1 pause episodes-- ECG appears artifact during implant, 0 brady episodes. 0 AF episodes (0% burden). Monthly summary reports and ROV with SK/B PRN.

## 2018-03-11 ENCOUNTER — Telehealth: Payer: Self-pay | Admitting: Family Medicine

## 2018-03-11 DIAGNOSIS — I69398 Other sequelae of cerebral infarction: Secondary | ICD-10-CM | POA: Diagnosis not present

## 2018-03-11 DIAGNOSIS — R55 Syncope and collapse: Secondary | ICD-10-CM | POA: Diagnosis not present

## 2018-03-11 DIAGNOSIS — E785 Hyperlipidemia, unspecified: Secondary | ICD-10-CM

## 2018-03-11 DIAGNOSIS — M6281 Muscle weakness (generalized): Secondary | ICD-10-CM | POA: Diagnosis not present

## 2018-03-11 DIAGNOSIS — I10 Essential (primary) hypertension: Secondary | ICD-10-CM | POA: Diagnosis not present

## 2018-03-11 DIAGNOSIS — E039 Hypothyroidism, unspecified: Secondary | ICD-10-CM | POA: Diagnosis not present

## 2018-03-11 MED ORDER — ATORVASTATIN CALCIUM 40 MG PO TABS: 40.0000 mg | ORAL_TABLET | Freq: Every day | ORAL | 3 refills | Status: DC

## 2018-03-11 NOTE — Telephone Encounter (Signed)
Lipitor was written by Dr. Earleen Newport on 02-07-18 when patient was discharged from hospital. Appointment with Dr. Chauncy Passy on 03-23-18 Please send refill if appropriate Pt called to see if NP Lauren Guse would refill Rx

## 2018-03-11 NOTE — Telephone Encounter (Signed)
Lipitor was written by Dr. Earleen Newport on 02-07-18 when patient was discharged from hospital. / Appointment with Dr. Chauncy Passy on 03-23-18 / Please send refill if appropriate.

## 2018-03-11 NOTE — Telephone Encounter (Signed)
Copied from [place] [phone]. Topic: Quick Communication - Rx Refill/Question >> Mar 11, 2018  2:15 PM Selinda Flavin B, [place] wrote: **Prescribed when in the hospital and has not had Lauren Guse write the new rx.**  Medication: atorvastatin (LIPITOR) 40 MG tablet  Has the patient contacted their pharmacy? Yes.   (Agent: If no, request that the patient contact the pharmacy for the refill.) (Agent: If yes, when and what did the pharmacy advise?)  Preferred Pharmacy (with phone number or street name): [facility], [place]: Please be advised that RX refills may take up to 3 business days. We ask that you follow-up with your pharmacy.

## 2018-03-11 NOTE — Telephone Encounter (Signed)
Please advise if refill appropriate.  

## 2018-03-11 NOTE — Telephone Encounter (Signed)
Called Pt to tell her Rx was sent to the pharmacy

## 2018-03-13 DIAGNOSIS — I69398 Other sequelae of cerebral infarction: Secondary | ICD-10-CM | POA: Diagnosis not present

## 2018-03-13 DIAGNOSIS — M6281 Muscle weakness (generalized): Secondary | ICD-10-CM | POA: Diagnosis not present

## 2018-03-13 DIAGNOSIS — I10 Essential (primary) hypertension: Secondary | ICD-10-CM | POA: Diagnosis not present

## 2018-03-13 DIAGNOSIS — R55 Syncope and collapse: Secondary | ICD-10-CM | POA: Diagnosis not present

## 2018-03-13 DIAGNOSIS — E039 Hypothyroidism, unspecified: Secondary | ICD-10-CM | POA: Diagnosis not present

## 2018-03-17 DIAGNOSIS — I69398 Other sequelae of cerebral infarction: Secondary | ICD-10-CM | POA: Diagnosis not present

## 2018-03-17 DIAGNOSIS — I10 Essential (primary) hypertension: Secondary | ICD-10-CM | POA: Diagnosis not present

## 2018-03-17 DIAGNOSIS — R55 Syncope and collapse: Secondary | ICD-10-CM | POA: Diagnosis not present

## 2018-03-17 DIAGNOSIS — E039 Hypothyroidism, unspecified: Secondary | ICD-10-CM | POA: Diagnosis not present

## 2018-03-17 DIAGNOSIS — M6281 Muscle weakness (generalized): Secondary | ICD-10-CM | POA: Diagnosis not present

## 2018-03-18 DIAGNOSIS — E039 Hypothyroidism, unspecified: Secondary | ICD-10-CM | POA: Diagnosis not present

## 2018-03-18 DIAGNOSIS — M6281 Muscle weakness (generalized): Secondary | ICD-10-CM | POA: Diagnosis not present

## 2018-03-18 DIAGNOSIS — R55 Syncope and collapse: Secondary | ICD-10-CM | POA: Diagnosis not present

## 2018-03-18 DIAGNOSIS — I69398 Other sequelae of cerebral infarction: Secondary | ICD-10-CM | POA: Diagnosis not present

## 2018-03-18 DIAGNOSIS — I10 Essential (primary) hypertension: Secondary | ICD-10-CM | POA: Diagnosis not present

## 2018-03-19 DIAGNOSIS — E039 Hypothyroidism, unspecified: Secondary | ICD-10-CM | POA: Diagnosis not present

## 2018-03-19 DIAGNOSIS — I69398 Other sequelae of cerebral infarction: Secondary | ICD-10-CM | POA: Diagnosis not present

## 2018-03-19 DIAGNOSIS — R55 Syncope and collapse: Secondary | ICD-10-CM | POA: Diagnosis not present

## 2018-03-19 DIAGNOSIS — I10 Essential (primary) hypertension: Secondary | ICD-10-CM | POA: Diagnosis not present

## 2018-03-19 DIAGNOSIS — M6281 Muscle weakness (generalized): Secondary | ICD-10-CM | POA: Diagnosis not present

## 2018-03-23 ENCOUNTER — Ambulatory Visit (INDEPENDENT_AMBULATORY_CARE_PROVIDER_SITE_OTHER): Payer: Medicare Other | Admitting: Family Medicine

## 2018-03-23 ENCOUNTER — Encounter: Payer: Self-pay | Admitting: Family Medicine

## 2018-03-23 VITALS — BP 128/78 | HR 84 | Temp 98.6°F | Ht 64.0 in | Wt 147.2 lb

## 2018-03-23 DIAGNOSIS — E039 Hypothyroidism, unspecified: Secondary | ICD-10-CM

## 2018-03-23 DIAGNOSIS — I63423 Cerebral infarction due to embolism of bilateral anterior cerebral arteries: Secondary | ICD-10-CM

## 2018-03-23 DIAGNOSIS — I10 Essential (primary) hypertension: Secondary | ICD-10-CM

## 2018-03-23 DIAGNOSIS — E559 Vitamin D deficiency, unspecified: Secondary | ICD-10-CM

## 2018-03-23 DIAGNOSIS — D696 Thrombocytopenia, unspecified: Secondary | ICD-10-CM | POA: Diagnosis not present

## 2018-03-23 DIAGNOSIS — Z8673 Personal history of transient ischemic attack (TIA), and cerebral infarction without residual deficits: Secondary | ICD-10-CM

## 2018-03-23 DIAGNOSIS — E785 Hyperlipidemia, unspecified: Secondary | ICD-10-CM

## 2018-03-23 LAB — CBC WITH DIFFERENTIAL/PLATELET
BASOS ABS: 0 10*3/uL (ref 0.0–0.1)
Basophils Relative: 0.6 % (ref 0.0–3.0)
EOS ABS: 0.3 10*3/uL (ref 0.0–0.7)
Eosinophils Relative: 3.8 % (ref 0.0–5.0)
HCT: 41.1 % (ref 36.0–46.0)
Hemoglobin: 13.5 g/dL (ref 12.0–15.0)
Lymphocytes Relative: 18.3 % (ref 12.0–46.0)
Lymphs Abs: 1.4 10*3/uL (ref 0.7–4.0)
MCHC: 32.8 g/dL (ref 30.0–36.0)
MCV: 97.9 fl (ref 78.0–100.0)
MONO ABS: 0.5 10*3/uL (ref 0.1–1.0)
Monocytes Relative: 7 % (ref 3.0–12.0)
NEUTROS PCT: 70.3 % (ref 43.0–77.0)
Neutro Abs: 5.3 10*3/uL (ref 1.4–7.7)
Platelets: 153 10*3/uL (ref 150.0–400.0)
RBC: 4.2 Mil/uL (ref 3.87–5.11)
RDW: 14 % (ref 11.5–15.5)
WBC: 7.6 10*3/uL (ref 4.0–10.5)

## 2018-03-23 LAB — VITAMIN D 25 HYDROXY (VIT D DEFICIENCY, FRACTURES): VITD: 26.25 ng/mL — AB (ref 30.00–100.00)

## 2018-03-23 NOTE — Progress Notes (Signed)
   Subjective:    Patient ID: Kaylee [name], female    DOB: [birth year], 77 y.o.   MRN: [id number]  HPI  Presents to clinic for a 6 week follow up on lipids, BP, thyroid, CVA history.    Currently patient is not on BP meds. She had been in past, but was taken off due to dropping too low. Bps have been stable while not on medications.  Also had been on thyroid medication in the past, but currently is not on any thyroid medication. We will recheck TSH today.  Sister reports patient had been on Vit D supplement in the past, currently does not take.   Had thrombocytopenia in last lab work - we will recheck this today.  She has seen cardiology and neurology for follow up. She had TEE done by Dr Rockey Situ on 11.12.19  Patient Active Problem List   Diagnosis Date Noted  . Essential hypertension 02/10/2018  . Hypothyroidism 02/10/2018  . Hyperlipidemia 02/10/2018  . CVA (cerebral vascular accident) ([place]) 02/05/2018   Social History   Tobacco Use  . Smoking status: Never Smoker  . Smokeless tobacco: Never Used  Substance Use Topics  . Alcohol use: Not Currently   Review of Systems  Constitutional: Negative for chills, fatigue and fever.  HENT: Negative for congestion, ear pain, sinus pain and sore throat.   Eyes: Negative.   Respiratory: Negative for cough, shortness of breath and wheezing.   Cardiovascular: Negative for chest pain, palpitations and leg swelling.  Gastrointestinal: Negative for abdominal pain, diarrhea, nausea and vomiting.  Genitourinary: Negative for dysuria, frequency and urgency.  Musculoskeletal: Negative for arthralgias and myalgias.  Skin: Negative for color change, pallor and rash.  Neurological: Negative for syncope, light-headedness and headaches.  Psychiatric/Behavioral: The patient is not nervous/anxious.       Objective:   Physical Exam  Constitutional: She appears well-developed and well-nourished. No distress.  HENT:  Head: Normocephalic and  atraumatic.  Eyes: Pupils are equal, round, and reactive to light. EOM are normal. No scleral icterus.  Neck: Normal range of motion. Neck supple. No tracheal deviation present.  Cardiovascular: Normal rate, regular rhythm and normal heart sounds. 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 No respiratory distress. She has no wheezes. She has no rales.  Abdominal: Soft. Bowel sounds are normal. There is no tenderness.  Neurological: She is alert and oriented to person, place, and time. Walks with cane, at her ambulation baseline.  Skin: Skin is warm and dry. No pallor.  Psychiatric: She has a normal mood and affect. Her behavior is normal. Thought content normal.   Nursing note and vitals reviewed.    Vitals:   03/23/18 1340  BP: 128/78  Pulse: 84  Temp: 98.6 F (37 C)  SpO2: 97%   Assessment & Plan:   CVA -- Stable. No obvious residual from CVA.  Vit D Def -- We will get new Vit D level in clinic today  Hyperlipidemia -- Tolerating statin well without issue  Hypothyroidism-we will recheck thyroid panel today.  Thrombocytopenia-new CBC drawn in clinic today to monitor.  Hypertension-patient's blood pressures well controlled currently without any medication.  Patient will keep regular follow-up as planned with cardiology and neurology.  She will follow-up in approximately 6 months.  Patient and family members aware to return to clinic sooner if any issues arise.  HTN -- BP is stable without any medication at this time

## 2018-03-24 ENCOUNTER — Telehealth: Payer: Self-pay

## 2018-03-24 ENCOUNTER — Encounter: Payer: Self-pay | Admitting: Family Medicine

## 2018-03-24 DIAGNOSIS — I69398 Other sequelae of cerebral infarction: Secondary | ICD-10-CM | POA: Diagnosis not present

## 2018-03-24 DIAGNOSIS — E039 Hypothyroidism, unspecified: Secondary | ICD-10-CM | POA: Diagnosis not present

## 2018-03-24 DIAGNOSIS — I10 Essential (primary) hypertension: Secondary | ICD-10-CM | POA: Diagnosis not present

## 2018-03-24 DIAGNOSIS — M6281 Muscle weakness (generalized): Secondary | ICD-10-CM | POA: Diagnosis not present

## 2018-03-24 DIAGNOSIS — R55 Syncope and collapse: Secondary | ICD-10-CM | POA: Diagnosis not present

## 2018-03-24 LAB — THYROID PANEL WITH TSH
FREE THYROXINE INDEX: 2.2 (ref 1.4–3.8)
T3 UPTAKE: 33 % (ref 22–35)
T4 TOTAL: 6.7 ug/dL (ref 5.1–11.9)
TSH: 3.4 mIU/L (ref 0.40–4.50)

## 2018-03-24 MED ORDER — VITAMIN D3 50 MCG (2000 UT) PO CAPS: 2000.0000 [IU] | ORAL_CAPSULE | Freq: Every day | ORAL | 1 refills | Status: DC

## 2018-03-24 NOTE — Addendum Note (Signed)
Addended by: Philis Nettle on: 03/24/2018 03:26 PM   Modules accepted: Orders

## 2018-03-24 NOTE — Telephone Encounter (Signed)
Attempted to call pt regarding her Linq alert. There was no answer and no messaging system to leave a VM. Will route to device triage for follow up.

## 2018-03-26 DIAGNOSIS — I69398 Other sequelae of cerebral infarction: Secondary | ICD-10-CM | POA: Diagnosis not present

## 2018-03-26 DIAGNOSIS — E039 Hypothyroidism, unspecified: Secondary | ICD-10-CM | POA: Diagnosis not present

## 2018-03-26 DIAGNOSIS — I10 Essential (primary) hypertension: Secondary | ICD-10-CM | POA: Diagnosis not present

## 2018-03-26 DIAGNOSIS — R55 Syncope and collapse: Secondary | ICD-10-CM | POA: Diagnosis not present

## 2018-03-26 DIAGNOSIS — M6281 Muscle weakness (generalized): Secondary | ICD-10-CM | POA: Diagnosis not present

## 2018-03-26 NOTE — Telephone Encounter (Signed)
Attempted to call patient regarding tachy episode from 03/23/18 x 10sec. N/A, unable to LVM.

## 2018-03-27 NOTE — Telephone Encounter (Signed)
Spoke with pt's sister regarding her tachy episode. She states her sister was doing ok all day that day and had no complaints. I advised pt's sister we would forward to Dr Caryl Comes for any recommendation, if any. She verbalized understanding.

## 2018-03-30 ENCOUNTER — Ambulatory Visit (INDEPENDENT_AMBULATORY_CARE_PROVIDER_SITE_OTHER): Payer: Medicare Other

## 2018-03-30 DIAGNOSIS — I63423 Cerebral infarction due to embolism of bilateral anterior cerebral arteries: Secondary | ICD-10-CM

## 2018-03-30 NOTE — Telephone Encounter (Signed)
Appears to be sinus or atrial tach, dont know how it started but in the absence of symptoms would just follow  Thanks

## 2018-03-30 NOTE — Telephone Encounter (Signed)
Informed Delorise Shiner of MD recommendations.

## 2018-03-30 NOTE — Telephone Encounter (Signed)
LMTCB/sss  No further rec.

## 2018-03-31 DIAGNOSIS — M6281 Muscle weakness (generalized): Secondary | ICD-10-CM | POA: Diagnosis not present

## 2018-03-31 DIAGNOSIS — I69398 Other sequelae of cerebral infarction: Secondary | ICD-10-CM | POA: Diagnosis not present

## 2018-03-31 DIAGNOSIS — R55 Syncope and collapse: Secondary | ICD-10-CM | POA: Diagnosis not present

## 2018-03-31 DIAGNOSIS — I10 Essential (primary) hypertension: Secondary | ICD-10-CM | POA: Diagnosis not present

## 2018-03-31 DIAGNOSIS — E039 Hypothyroidism, unspecified: Secondary | ICD-10-CM | POA: Diagnosis not present

## 2018-03-31 NOTE — Progress Notes (Signed)
Carelink Summary Report / Loop Recorder 

## 2018-04-02 DIAGNOSIS — M6281 Muscle weakness (generalized): Secondary | ICD-10-CM | POA: Diagnosis not present

## 2018-04-02 DIAGNOSIS — I69398 Other sequelae of cerebral infarction: Secondary | ICD-10-CM | POA: Diagnosis not present

## 2018-04-02 DIAGNOSIS — E039 Hypothyroidism, unspecified: Secondary | ICD-10-CM | POA: Diagnosis not present

## 2018-04-02 DIAGNOSIS — R55 Syncope and collapse: Secondary | ICD-10-CM | POA: Diagnosis not present

## 2018-04-02 DIAGNOSIS — I10 Essential (primary) hypertension: Secondary | ICD-10-CM | POA: Diagnosis not present

## 2018-04-06 DIAGNOSIS — I69398 Other sequelae of cerebral infarction: Secondary | ICD-10-CM | POA: Diagnosis not present

## 2018-04-06 DIAGNOSIS — R55 Syncope and collapse: Secondary | ICD-10-CM | POA: Diagnosis not present

## 2018-04-06 DIAGNOSIS — M6281 Muscle weakness (generalized): Secondary | ICD-10-CM | POA: Diagnosis not present

## 2018-04-06 DIAGNOSIS — I10 Essential (primary) hypertension: Secondary | ICD-10-CM | POA: Diagnosis not present

## 2018-04-06 DIAGNOSIS — E039 Hypothyroidism, unspecified: Secondary | ICD-10-CM | POA: Diagnosis not present

## 2018-04-10 DIAGNOSIS — I69398 Other sequelae of cerebral infarction: Secondary | ICD-10-CM | POA: Diagnosis not present

## 2018-04-10 DIAGNOSIS — M6281 Muscle weakness (generalized): Secondary | ICD-10-CM | POA: Diagnosis not present

## 2018-04-10 DIAGNOSIS — I10 Essential (primary) hypertension: Secondary | ICD-10-CM | POA: Diagnosis not present

## 2018-04-10 DIAGNOSIS — E039 Hypothyroidism, unspecified: Secondary | ICD-10-CM | POA: Diagnosis not present

## 2018-04-10 DIAGNOSIS — R55 Syncope and collapse: Secondary | ICD-10-CM | POA: Diagnosis not present

## 2018-05-01 ENCOUNTER — Ambulatory Visit (INDEPENDENT_AMBULATORY_CARE_PROVIDER_SITE_OTHER): Payer: Medicare Other

## 2018-05-01 DIAGNOSIS — I639 Cerebral infarction, unspecified: Secondary | ICD-10-CM | POA: Diagnosis not present

## 2018-05-02 LAB — CUP PACEART REMOTE DEVICE CHECK
Implantable Pulse Generator Implant Date: 20191112
MDC IDC SESS DTM: 20200117144031

## 2018-05-03 LAB — CUP PACEART REMOTE DEVICE CHECK
Implantable Pulse Generator Implant Date: 20191112
MDC IDC SESS DTM: 20191215133734

## 2018-05-04 NOTE — Progress Notes (Signed)
Carelink Summary Report / Loop Recorder 

## 2018-06-03 ENCOUNTER — Ambulatory Visit (INDEPENDENT_AMBULATORY_CARE_PROVIDER_SITE_OTHER): Payer: Medicare Other

## 2018-06-03 DIAGNOSIS — I639 Cerebral infarction, unspecified: Secondary | ICD-10-CM

## 2018-06-04 LAB — CUP PACEART REMOTE DEVICE CHECK
Date Time Interrogation Session: 20200219154049
MDC IDC PG IMPLANT DT: 20191112

## 2018-06-11 NOTE — Progress Notes (Signed)
Carelink Summary Report / Loop Recorder 

## 2018-06-18 ENCOUNTER — Telehealth: Payer: Self-pay | Admitting: Cardiology

## 2018-06-18 NOTE — Telephone Encounter (Signed)
LMOVM for pt to return call. We need pt to send a manual transmission w/ her home monitor.

## 2018-06-19 ENCOUNTER — Telehealth: Payer: Self-pay

## 2018-06-19 NOTE — Telephone Encounter (Signed)
See encounter for 3/6. Remote has been sent and reviewed. Duplicate encounter.

## 2018-06-19 NOTE — Telephone Encounter (Signed)
Manual transmission received. 4 tachy episodes, all SVT around 154bpm. If ongoing tachy episodes, will consider increasing rate to 170bpm. Follow up as scheduled.

## 2018-06-19 NOTE — Telephone Encounter (Signed)
I help the pt send a manual transmission. I told her if the nurse see anything on it she will give her a call back. If everything looks normal than she will not receive a phone call.

## 2018-07-06 ENCOUNTER — Other Ambulatory Visit: Payer: Self-pay

## 2018-07-06 ENCOUNTER — Ambulatory Visit (INDEPENDENT_AMBULATORY_CARE_PROVIDER_SITE_OTHER): Payer: Medicare Other | Admitting: *Deleted

## 2018-07-06 DIAGNOSIS — I639 Cerebral infarction, unspecified: Secondary | ICD-10-CM

## 2018-07-06 LAB — CUP PACEART REMOTE DEVICE CHECK
Date Time Interrogation Session: 20200323174208
MDC IDC PG IMPLANT DT: 20191112

## 2018-07-07 ENCOUNTER — Telehealth: Payer: Self-pay

## 2018-07-07 NOTE — Telephone Encounter (Signed)
Pt sister help send the manual transmission. I told her if the nurse see anything she will call her and if everything is normal the nurse will not call back.

## 2018-07-08 NOTE — Telephone Encounter (Signed)
Manual transmission received and reviewed. 2 tachy episodes--both ECGs suggest SVT, median V rate 154bpm, longest 15sec, similar to previous tachy episodes. No evidence of A-fib. Will continue to monitor remotely via Carelink.

## 2018-07-14 NOTE — Progress Notes (Signed)
Carelink Summary Report / Loop Recorder 

## 2018-08-10 ENCOUNTER — Other Ambulatory Visit: Payer: Self-pay

## 2018-08-10 ENCOUNTER — Ambulatory Visit (INDEPENDENT_AMBULATORY_CARE_PROVIDER_SITE_OTHER): Payer: Medicare Other | Admitting: *Deleted

## 2018-08-10 DIAGNOSIS — I639 Cerebral infarction, unspecified: Secondary | ICD-10-CM | POA: Diagnosis not present

## 2018-08-10 LAB — CUP PACEART REMOTE DEVICE CHECK
Date Time Interrogation Session: 20200425180950
Implantable Pulse Generator Implant Date: 20191112

## 2018-08-17 NOTE — Progress Notes (Signed)
Carelink Summary Report / Loop Recorder 

## 2018-09-10 ENCOUNTER — Ambulatory Visit (INDEPENDENT_AMBULATORY_CARE_PROVIDER_SITE_OTHER): Payer: Medicare Other | Admitting: *Deleted

## 2018-09-10 DIAGNOSIS — I639 Cerebral infarction, unspecified: Secondary | ICD-10-CM | POA: Diagnosis not present

## 2018-09-11 LAB — CUP PACEART REMOTE DEVICE CHECK
Date Time Interrogation Session: 20200528181108
Implantable Pulse Generator Implant Date: 20191112

## 2018-09-14 ENCOUNTER — Telehealth: Payer: Self-pay | Admitting: Cardiology

## 2018-09-14 NOTE — Telephone Encounter (Signed)
LMOVM requesting that pt send a manual transmission w/ her home monitor.

## 2018-09-15 NOTE — Telephone Encounter (Signed)
2nd attempt  LMOVM requesting a manual transmission w/ the home monitor.

## 2018-09-16 NOTE — Progress Notes (Signed)
Carelink Summary Report / Loop Recorder 

## 2018-09-16 NOTE — Telephone Encounter (Signed)
3rd attempt.   Attempted to call pt. No answer and unable to leave a message.

## 2018-09-17 NOTE — Telephone Encounter (Signed)
4th attempt.   LMOVM for pt to send manual transmission.

## 2018-09-18 NOTE — Telephone Encounter (Signed)
Pt sister called and stated that they are trying to send transmission but can't get the monitor to show them the steps. Instructed pt sister how to send transmission. Transmission received.

## 2018-09-18 NOTE — Telephone Encounter (Signed)
5th attempt  LMOVM for pt to send manual transmission.

## 2018-10-13 ENCOUNTER — Ambulatory Visit (INDEPENDENT_AMBULATORY_CARE_PROVIDER_SITE_OTHER): Payer: Medicare Other | Admitting: *Deleted

## 2018-10-13 DIAGNOSIS — I639 Cerebral infarction, unspecified: Secondary | ICD-10-CM | POA: Diagnosis not present

## 2018-10-13 LAB — CUP PACEART REMOTE DEVICE CHECK
Date Time Interrogation Session: 20200630181203
Implantable Pulse Generator Implant Date: 20191112

## 2018-10-19 ENCOUNTER — Telehealth: Payer: Self-pay | Admitting: Internal Medicine

## 2018-10-19 NOTE — Telephone Encounter (Signed)
Pt reports she has been well and had no symptoms at time of event. Event appears to be SVT that lasted 36 sec on 10/17/18.

## 2018-10-19 NOTE — Telephone Encounter (Signed)
LMOM to xcall DC. Need to assess to find out if symptomatic during LINQ event.

## 2018-10-20 NOTE — Telephone Encounter (Signed)
Cindy, these rhtyhms are hard tosort,  Note that the duration of the rhtyhm as recorded is only when it exceeds the Vrate threshold and note also that as it neither starts nor stops abruptly we cant tell whether it is sinus or what-- one thing we can do is to lower the detection rate to 150 or so and see if that doesn't help capture the onset /offset and thus help un understand the mechanism of the SVt

## 2018-10-22 ENCOUNTER — Telehealth: Payer: Self-pay | Admitting: Anesthesiology

## 2018-10-22 ENCOUNTER — Telehealth: Payer: Self-pay | Admitting: Internal Medicine

## 2018-10-22 NOTE — Telephone Encounter (Signed)
Spoke with pt and daughter about scheduling DC appointment to reprogram loop per Dr Caryl Comes to increase rate to 150. Daughter requested pt son be contacted to set up appointment because he will provide transportion.

## 2018-10-22 NOTE — Telephone Encounter (Signed)
Notfied of 11/10/18 appointment in [place].

## 2018-10-22 NOTE — Telephone Encounter (Signed)
Spoke with Chrisandra Carota per fapt and DPR. Pt scheduled for DC appointment 11/10/18 at 1130 am. Pt notified .

## 2018-10-24 NOTE — Progress Notes (Signed)
Carelink Summary Report / Loop Recorder 

## 2018-11-02 ENCOUNTER — Telehealth: Payer: Self-pay | Admitting: Internal Medicine

## 2018-11-02 NOTE — Telephone Encounter (Signed)
  Patient's son, Sonya Gunnoe, is calling because Ms Needs has an appt 7/28 to come into the device clinic. He states that he will need to come with his mom due to her mental status. Please call to discuss if it is a problem.

## 2018-11-02 NOTE — Telephone Encounter (Signed)
It is OK. I will add a note to his visit that it is OK. Thanks!

## 2018-11-09 ENCOUNTER — Telehealth: Payer: Self-pay

## 2018-11-09 NOTE — Telephone Encounter (Signed)
    COVID-19 Pre-Screening Questions:  . In the past 7 to 10 days have you had a cough,  shortness of breath, headache, congestion, fever (100 or greater) body aches, chills, sore throat, or sudden loss of taste or sense of smell? No . Have you been around anyone with known Covid 19. No . Have you been around anyone who is awaiting Covid 19 test results in the past 7 to 10 days? No . Have you been around anyone who has been exposed to Covid 19, or has mentioned symptoms of Covid 19 within the past 7 to 10 days? No  If you have any concerns/questions about symptoms patients report during screening (either on the phone or at threshold). Contact the provider seeing the patient or DOD for further guidance.  If neither are available contact a member of the leadership team.         The pt sister answered no to all the covid-19 prescreening questions. I let the pt sister know the pt and her son will have to wear a mask to the appointment. I told her if anything changes between now and her appointment time to call to let us know. The pt sister verbalized understanding.

## 2018-11-10 ENCOUNTER — Ambulatory Visit (INDEPENDENT_AMBULATORY_CARE_PROVIDER_SITE_OTHER): Payer: Medicare Other | Admitting: *Deleted

## 2018-11-10 ENCOUNTER — Other Ambulatory Visit: Payer: Self-pay

## 2018-11-10 DIAGNOSIS — I63423 Cerebral infarction due to embolism of bilateral anterior cerebral arteries: Secondary | ICD-10-CM

## 2018-11-10 LAB — CUP PACEART INCLINIC DEVICE CHECK
Date Time Interrogation Session: 20200728115612
Implantable Pulse Generator Implant Date: 20191112

## 2018-11-10 NOTE — Progress Notes (Signed)
Loop check in clinic. Battery status: good. R-waves 0.24mV. 0 symptom episodes, 26 tachy episodes (available ECGs suggest SVT), 0 pause episodes, 0 brady episodes. 0 AF episodes (0% burden). Monthly summary reports and ROV with SK PRN. Lowered tachy detection rate to 140bpm per Luetta Nutting, NP.

## 2018-11-15 LAB — CUP PACEART REMOTE DEVICE CHECK
Date Time Interrogation Session: 20200802181129
Implantable Pulse Generator Implant Date: 20191112

## 2018-11-16 ENCOUNTER — Ambulatory Visit (INDEPENDENT_AMBULATORY_CARE_PROVIDER_SITE_OTHER): Payer: Medicare Other | Admitting: *Deleted

## 2018-11-16 DIAGNOSIS — I63423 Cerebral infarction due to embolism of bilateral anterior cerebral arteries: Secondary | ICD-10-CM | POA: Diagnosis not present

## 2018-11-25 NOTE — Progress Notes (Signed)
Carelink Summary Report / Loop Recorder 

## 2018-12-16 ENCOUNTER — Telehealth: Payer: Self-pay | Admitting: Cardiology

## 2018-12-16 NOTE — Telephone Encounter (Signed)
Spoke w/ pt and caregiver and instructed them how to send a manual transmission w/ the home monitor. Transmission received.

## 2018-12-16 NOTE — Telephone Encounter (Signed)
Transmission reviewed. 24 "tachy" episodes since 09/18/18. ECGs suggest SVT, median V rate 143bpm, though unable to see onset (despite tachy detection having been lowered to 140bpm at DC appointment on 11/10/18). Routed to Dr. Caryl Comes for review.

## 2018-12-16 NOTE — Telephone Encounter (Signed)
-----   Message from Mechele Dawley, RN sent at 12/15/2018  6:54 PM EDT ----- Regarding: needs manual transmission Please call Ms. Peregrina and assist her with a manual LINQ transmission.  Thanks! Raquel Sarna

## 2018-12-18 ENCOUNTER — Ambulatory Visit (INDEPENDENT_AMBULATORY_CARE_PROVIDER_SITE_OTHER): Payer: Medicare Other | Admitting: *Deleted

## 2018-12-18 DIAGNOSIS — I63423 Cerebral infarction due to embolism of bilateral anterior cerebral arteries: Secondary | ICD-10-CM

## 2018-12-20 LAB — CUP PACEART REMOTE DEVICE CHECK
Date Time Interrogation Session: 20200904180838
Implantable Pulse Generator Implant Date: 20191112

## 2018-12-20 NOTE — Telephone Encounter (Signed)
Agree Raquel Sarna Thanks SK

## 2018-12-30 NOTE — Progress Notes (Signed)
Carelink Summary Report / Loop Recorder 

## 2019-01-17 LAB — CUP PACEART REMOTE DEVICE CHECK
Date Time Interrogation Session: 20201004040500
Implantable Pulse Generator Implant Date: 20191112

## 2019-01-20 ENCOUNTER — Ambulatory Visit (INDEPENDENT_AMBULATORY_CARE_PROVIDER_SITE_OTHER): Payer: Medicare Other | Admitting: *Deleted

## 2019-01-20 DIAGNOSIS — I63423 Cerebral infarction due to embolism of bilateral anterior cerebral arteries: Secondary | ICD-10-CM

## 2019-01-21 LAB — CUP PACEART REMOTE DEVICE CHECK
Date Time Interrogation Session: 20201007181916
Implantable Pulse Generator Implant Date: 20191112

## 2019-01-29 ENCOUNTER — Ambulatory Visit: Payer: Medicare Other | Admitting: Family Medicine

## 2019-01-29 NOTE — Progress Notes (Signed)
Carelink Summary Report / Loop Recorder 

## 2019-02-16 ENCOUNTER — Telehealth: Payer: Self-pay | Admitting: Emergency Medicine

## 2019-02-16 NOTE — Telephone Encounter (Signed)
LMOM.  Need to assess if patient is symptomatic with tachy episodes on 02/13/19 and 02/15/19.

## 2019-02-17 NOTE — Telephone Encounter (Signed)
Patient's son returned call. Pt denies any cardiac symptoms. Assisted with manual LINQ transmission--ECGs continue to suggest ST vs. AT, unable to see onset despite tachy detection programmed at 140bpm. Pt's son aware of findings and aware we will continue to monitor remotely via [employer]. No further questions at this time.

## 2019-02-22 ENCOUNTER — Ambulatory Visit (INDEPENDENT_AMBULATORY_CARE_PROVIDER_SITE_OTHER): Payer: Medicare Other | Admitting: *Deleted

## 2019-02-22 DIAGNOSIS — I63423 Cerebral infarction due to embolism of bilateral anterior cerebral arteries: Secondary | ICD-10-CM

## 2019-02-23 LAB — CUP PACEART REMOTE DEVICE CHECK
Date Time Interrogation Session: 20201109182057
Implantable Pulse Generator Implant Date: 20191112

## 2019-03-20 NOTE — Progress Notes (Signed)
Carelink Summary Report / Loop Recorder 

## 2019-03-22 ENCOUNTER — Telehealth: Payer: Self-pay

## 2019-03-22 ENCOUNTER — Other Ambulatory Visit: Payer: Self-pay

## 2019-03-22 DIAGNOSIS — E785 Hyperlipidemia, unspecified: Secondary | ICD-10-CM

## 2019-03-22 MED ORDER — ATORVASTATIN CALCIUM 40 MG PO TABS: 40.0000 mg | ORAL_TABLET | Freq: Every day | ORAL | 3 refills | Status: DC

## 2019-03-22 NOTE — Telephone Encounter (Signed)
Copied from [place] [phone]. Topic: General - Inquiry >> Mar 22, 2019  8:31 AM Mathis Bud wrote: Reason for CRM: nancy patients sister - on [place].  Izora Gala is requesting patient to get medication refill atorvastatin (LIPITOR) 40 MG tablet . Patient is out of medication. They both were unaware that PCP left office.  She needs a TOC appt, but Izora Gala would like prescription filled first [place] call back [phone]

## 2019-03-22 NOTE — Telephone Encounter (Signed)
Lm to schedule

## 2019-03-28 LAB — CUP PACEART REMOTE DEVICE CHECK
Date Time Interrogation Session: 20201212132017
Implantable Pulse Generator Implant Date: 20191112

## 2019-03-29 ENCOUNTER — Ambulatory Visit (INDEPENDENT_AMBULATORY_CARE_PROVIDER_SITE_OTHER): Payer: Medicare Other | Admitting: *Deleted

## 2019-03-29 ENCOUNTER — Telehealth: Payer: Self-pay | Admitting: *Deleted

## 2019-03-29 DIAGNOSIS — I63423 Cerebral infarction due to embolism of bilateral anterior cerebral arteries: Secondary | ICD-10-CM | POA: Diagnosis not present

## 2019-03-29 NOTE — Telephone Encounter (Signed)
LMOM (DPR) requesting manual LINQ transmission for review. Directions and DC phone number/office hours provided.   LINQ alert received for 30 "tachy" episodes since 02/17/19. Available ECGs appear ST vs. SVT, similar to previous episodes.

## 2019-03-30 NOTE — Telephone Encounter (Signed)
Full transmission received and episodes recorded on 12/11 and 03/27/19 appear to be ST, 143 to 158 bpm. Patient reports she was unaware of episodes and reports no CP, no SOB, no. dizziness, and no syncope. Similar episodes observed on previous transmissions.

## 2019-04-04 NOTE — Telephone Encounter (Signed)
Agree that it looks like sinus But could be an atrial tack  can we decrease the detection rate to try and catch onset Thanks SK

## 2019-04-08 ENCOUNTER — Telehealth: Payer: Self-pay

## 2019-04-08 NOTE — Telephone Encounter (Signed)
I spoke with the pt sister Izora Gala and asked her to send a transmission with the pt monitor. I told her when the nurse review if everything is normal she will not call. If she see something she will give the pt a call back. Izora Gala verbalized understanding.

## 2019-04-08 NOTE — Telephone Encounter (Addendum)
LMOM (DPR) advising of non-urgent need for call back to discuss possible LINQ reprogramming. Provided direct phone number and advised of holiday hours.  Per phone note from 03/29/19, Dr. Caryl Comes recommended lowering detection rate to help determine tachy episode onset. Transmission shows 3 SVT episodes, median V rate 143-146bpm, longest 33min 36sec, similar to previous episodes.

## 2019-04-08 NOTE — Telephone Encounter (Signed)
Transmission received 04/08/2019

## 2019-04-12 NOTE — Telephone Encounter (Signed)
Scheduled patient for ILR reprogramming on 04/20/19 at 1230.

## 2019-04-12 NOTE — Telephone Encounter (Signed)
Ms. Kaylee [name] was returning [place] phone. I let her talk with Jenny Reichmann, RN.

## 2019-04-20 ENCOUNTER — Other Ambulatory Visit: Payer: Self-pay

## 2019-04-20 ENCOUNTER — Ambulatory Visit (INDEPENDENT_AMBULATORY_CARE_PROVIDER_SITE_OTHER): Payer: Medicare Other | Admitting: *Deleted

## 2019-04-20 DIAGNOSIS — I639 Cerebral infarction, unspecified: Secondary | ICD-10-CM

## 2019-04-20 LAB — CUP PACEART INCLINIC DEVICE CHECK
Date Time Interrogation Session: 20210105122300
Implantable Pulse Generator Implant Date: 20191112

## 2019-04-20 NOTE — Progress Notes (Signed)
Loop check in clinic. Battery status: Good. R-waves 0.42 mV. 0  symptom episodes, 97 tachy episodes, Tachy detection decreased to 130 bpm to determine onset of episode per Dr Caryl Comes , 0 pause episodes, 0  brady episodes. 0 AF episodes (0% burden).

## 2019-04-26 ENCOUNTER — Telehealth: Payer: Self-pay | Admitting: Emergency Medicine

## 2019-04-26 NOTE — Telephone Encounter (Signed)
LMOM. Need remote transmission  to assess tachy episodes noted on 04/23/19 report.

## 2019-04-27 NOTE — Progress Notes (Signed)
ILR remote 

## 2019-04-27 NOTE — Telephone Encounter (Signed)
I called the pt to get a transmission but did not get an answer.

## 2019-04-27 NOTE — Telephone Encounter (Signed)
Kaylee [name] called to send a transmission with the pt monitor but received the error code [id number]. I conference call [employer] to get additional help for the pt. Kaylee Gala hanged the phone up before [employer] answered. I gave the [employer] representative the phone number to assist with troubleshooting the monitor.

## 2019-04-28 NOTE — Telephone Encounter (Signed)
Pt son Merry Proud called stating he just sent the transmission from the pt monitor. I told him the nurse will review it and give him a call back.

## 2019-04-29 ENCOUNTER — Ambulatory Visit (INDEPENDENT_AMBULATORY_CARE_PROVIDER_SITE_OTHER): Payer: Medicare Other | Admitting: *Deleted

## 2019-04-29 DIAGNOSIS — I63423 Cerebral infarction due to embolism of bilateral anterior cerebral arteries: Secondary | ICD-10-CM

## 2019-04-29 LAB — CUP PACEART REMOTE DEVICE CHECK
Date Time Interrogation Session: 20210114000500
Implantable Pulse Generator Implant Date: 20191112

## 2019-04-29 NOTE — Telephone Encounter (Signed)
Manual transmission reviewed. 16 tachy episode ECGs continue to suggest ST vs. SVT, V rates 130s-150s. Tachy detection previously decreased to 130bpm to help determine episode onset. Episodes with available onset included below. Both show gradual onset. Routed to Dr. Caryl Comes for review and to request order to increase tachy detection rate to reduce episode detections.

## 2019-04-30 LAB — CUP PACEART REMOTE DEVICE CHECK
Date Time Interrogation Session: 20210114132810
Implantable Pulse Generator Implant Date: 20191112

## 2019-04-30 NOTE — Telephone Encounter (Signed)
Agree that this is either sinus or atrial tach-- for sinus its kind of fast for a older woman, for atrial tach its kind of slow to start, and so suspect the former  Does she have followup

## 2019-04-30 NOTE — Progress Notes (Signed)
ILR remote 

## 2019-04-30 NOTE — Telephone Encounter (Signed)
Patient does not currently have follow-up with our office as she was implanted for cryptogenic stroke. Routed back to Dr. Caryl Comes.

## 2019-05-05 NOTE — Telephone Encounter (Signed)
Ladies can we ask mrs Dearinger to follow up with her PCP for what looks like sinus tach Thanks SK

## 2019-05-05 NOTE — Telephone Encounter (Signed)
I spoke with the patient's sister, Izora Gala (ok per DPR), regarding Dr. Olin Pia recommendations for the patient to follow up with her PCP for her sinus tachycardia.  Per Izora Gala, the patient previously saw Philis Nettle, NP, but she is no longer with the practice. They do not know who the patient is going to be seeing.  I advised I would send a copy of this note to Lauren's old office and see if someone from there can reach out to establish her with another PCP to make sure there is no underlying cause for the patient's tachycardia.  I have advised Izora Gala to reach out to the PCP office is she does not hear anything by Friday.  Izora Gala voices understanding and is agreeable.

## 2019-05-18 ENCOUNTER — Telehealth: Payer: Self-pay

## 2019-05-18 NOTE — Telephone Encounter (Signed)
Called and spoke to Delorise Shiner, patients sister. Informed Izora Gala patient has had several alerts on LINQ and requesting patient come into office to have settings adjusted. Izora Gala agrees, questions answered. Izora Gala states patient has not had any complaints that she is aware of. Informed Izora Gala, scheduler will be in contact to set up an appointment. Verbalizes understanding. Informed nancy to call back with any questions or concerns.  Per VO Dr. Caryl Comes, will reprogram tachy detection to 170 bpm.

## 2019-05-31 ENCOUNTER — Ambulatory Visit (INDEPENDENT_AMBULATORY_CARE_PROVIDER_SITE_OTHER): Payer: Medicare Other | Admitting: *Deleted

## 2019-05-31 DIAGNOSIS — I63423 Cerebral infarction due to embolism of bilateral anterior cerebral arteries: Secondary | ICD-10-CM | POA: Diagnosis not present

## 2019-05-31 LAB — CUP PACEART REMOTE DEVICE CHECK
Date Time Interrogation Session: 20210214234403
Implantable Pulse Generator Implant Date: 20191112

## 2019-05-31 NOTE — Progress Notes (Signed)
ILR Remote 

## 2019-06-08 ENCOUNTER — Other Ambulatory Visit: Payer: Self-pay

## 2019-06-08 ENCOUNTER — Ambulatory Visit (INDEPENDENT_AMBULATORY_CARE_PROVIDER_SITE_OTHER): Payer: Medicare Other | Admitting: *Deleted

## 2019-06-08 DIAGNOSIS — I639 Cerebral infarction, unspecified: Secondary | ICD-10-CM

## 2019-06-08 NOTE — Progress Notes (Signed)
Loop check in clinic. Battery status: 0. R-waves 0.24mV.0 symptom episodes, 31 tachy episodes that appear to be ST, Tachy detection increased to 171 bpm per SK,0 pause episodes, 0 brady episodes. 0AF episodes (0% burden). Monthly summary reports and ROV with Dr Caryl Comes prn.

## 2019-06-09 LAB — CUP PACEART INCLINIC DEVICE CHECK
Date Time Interrogation Session: 20210223112600
Implantable Pulse Generator Implant Date: 20191112

## 2019-07-01 ENCOUNTER — Ambulatory Visit (INDEPENDENT_AMBULATORY_CARE_PROVIDER_SITE_OTHER): Payer: Medicare Other | Admitting: *Deleted

## 2019-07-01 DIAGNOSIS — I63423 Cerebral infarction due to embolism of bilateral anterior cerebral arteries: Secondary | ICD-10-CM | POA: Diagnosis not present

## 2019-07-01 LAB — CUP PACEART REMOTE DEVICE CHECK
Date Time Interrogation Session: 20210318012805
Implantable Pulse Generator Implant Date: 20191112

## 2019-07-01 NOTE — Progress Notes (Signed)
ILR Remote 

## 2019-07-16 ENCOUNTER — Ambulatory Visit: Payer: Medicare Other | Attending: Internal Medicine

## 2019-07-16 DIAGNOSIS — Z23 Encounter for immunization: Secondary | ICD-10-CM

## 2019-07-16 NOTE — Progress Notes (Signed)
   Covid-19 Vaccination Clinic  Name:  AZELYN WINKER    MRN: [id number] DOB: May 23, 1940  07/16/2019  Ms. Kaigler was observed post Covid-19 immunization for 15 minutes without incident. She was provided with Vaccine Information Sheet and instruction to access the V-Safe system.   Ms. Stumbaugh was instructed to call 911 with any severe reactions post vaccine: Marland Kitchen Difficulty breathing  . Swelling of face and throat  . A fast heartbeat  . A bad rash all over body  . Dizziness and weakness   Immunizations Administered    Name Date Dose VIS Date Route   Pfizer COVID-19 Vaccine 07/16/2019 10:47 AM 0.3 mL 03/26/2019 Intramuscular   Manufacturer: [place]   Lot: [id number]   [place]: [id number]

## 2019-08-01 LAB — CUP PACEART REMOTE DEVICE CHECK
Date Time Interrogation Session: 20210418014400
Implantable Pulse Generator Implant Date: 20191112

## 2019-08-02 ENCOUNTER — Ambulatory Visit (INDEPENDENT_AMBULATORY_CARE_PROVIDER_SITE_OTHER): Payer: Medicare Other | Admitting: *Deleted

## 2019-08-02 DIAGNOSIS — I63423 Cerebral infarction due to embolism of bilateral anterior cerebral arteries: Secondary | ICD-10-CM | POA: Diagnosis not present

## 2019-08-02 NOTE — Progress Notes (Signed)
ILR Remote 

## 2019-08-10 ENCOUNTER — Ambulatory Visit: Payer: Medicare Other | Attending: Internal Medicine

## 2019-08-10 DIAGNOSIS — Z23 Encounter for immunization: Secondary | ICD-10-CM

## 2019-08-10 NOTE — Progress Notes (Signed)
   Covid-19 Vaccination Clinic  Name:  Kaylee [name]    MRN: [id number] DOB: 05/08/1940  08/10/2019  [name] was observed post Covid-19 immunization for 15 minutes without incident. She was provided with Vaccine Information Sheet and instruction to access the V-Safe system.   [name] was instructed to call 911 with any severe reactions post vaccine: Marland Kitchen Difficulty breathing  . Swelling of face and throat  . A fast heartbeat  . A bad rash all over body  . Dizziness and weakness   Immunizations Administered    Name Date Dose VIS Date Route   Pfizer COVID-19 Vaccine 08/10/2019 11:24 AM 0.3 mL 06/09/2018 Intramuscular   Manufacturer: [place]   Lot: [id number]   [place]: [id number]

## 2019-09-01 LAB — CUP PACEART REMOTE DEVICE CHECK
Date Time Interrogation Session: 20210519015316
Implantable Pulse Generator Implant Date: 20191112

## 2019-09-06 ENCOUNTER — Ambulatory Visit (INDEPENDENT_AMBULATORY_CARE_PROVIDER_SITE_OTHER): Payer: Medicare Other | Admitting: *Deleted

## 2019-09-06 DIAGNOSIS — I63423 Cerebral infarction due to embolism of bilateral anterior cerebral arteries: Secondary | ICD-10-CM | POA: Diagnosis not present

## 2019-09-06 NOTE — Progress Notes (Signed)
Carelink Summary Report / Loop Recorder 

## 2019-09-16 ENCOUNTER — Telehealth: Payer: Self-pay | Admitting: Emergency Medicine

## 2019-09-16 NOTE — Telephone Encounter (Addendum)
Patient attempted to send remote transmission to get all events recorded 09/15/19. 1 evnt appears to be SVT . Patient's sister reports that she was more  active yesterday changing sheets on the bed, doing laundry and other chores around the house. Patient reports no change in condition per family. Attempt to send transmission failed . Will allow receiver to charge and attempt to send transmission in an hour. Device clinic # provided to call for help with transmission if needed.

## 2019-09-24 IMAGING — CT CT HEAD W/O CM
4 series · 16 of 47 positions shown, 18 images · non-contrast
Comparison: None.

CLINICAL DATA: Patient passed out and hit back of head.  No pain.

EXAM:
CT HEAD WITHOUT CONTRAST
TECHNIQUE: Contiguous axial images were obtained from the base of the skull
through the vertex without intravenous contrast.

[Series 2: head wo · axial · 0.41mm/px · z∈[-146,-36]mm · 7 of 30 slices shown, 9 images]
[im 4/30  brain]
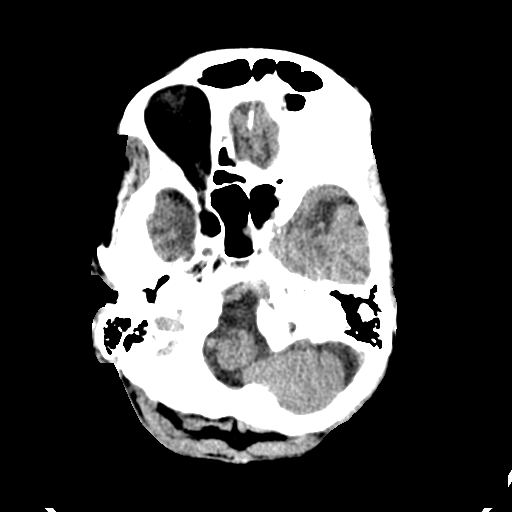
[im 4/30  bone]
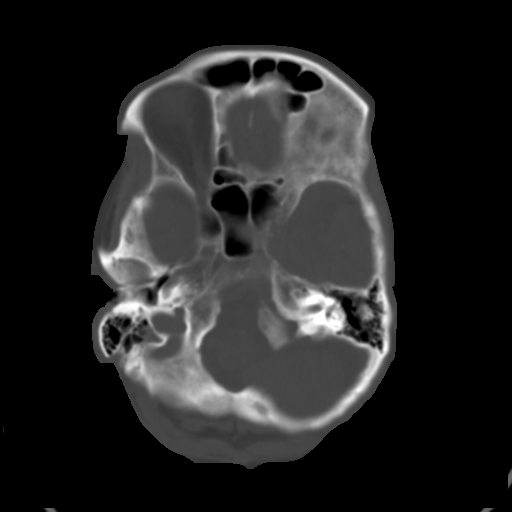
[im 8/30  brain]
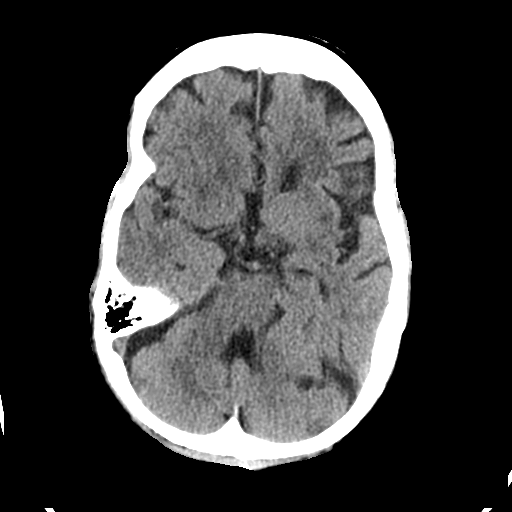
[im 11/30  brain]
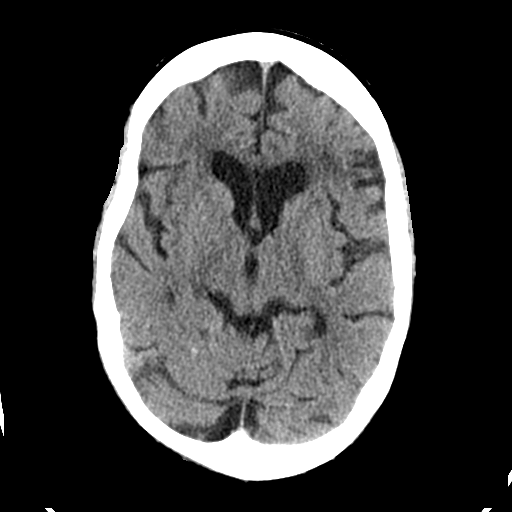
[im 15/30  brain]
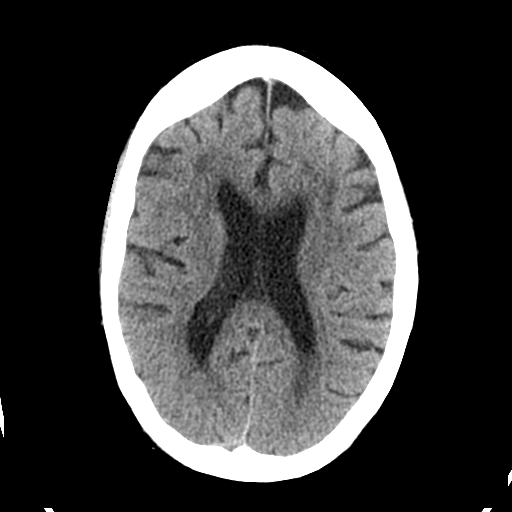
[im 19/30  brain]
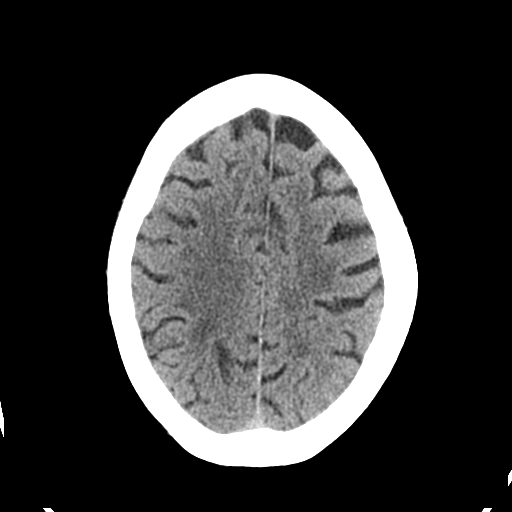
[im 19/30  bone]
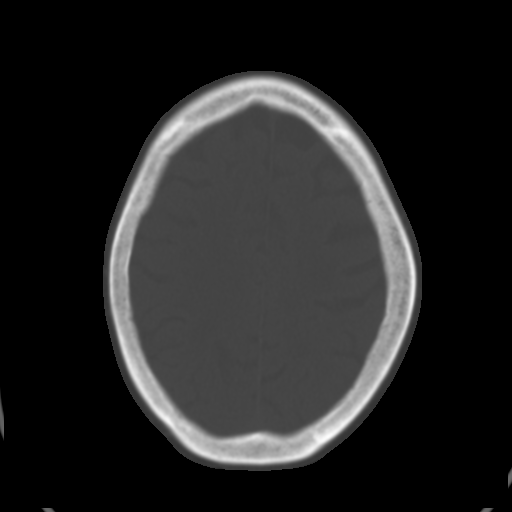
[im 22/30  brain]
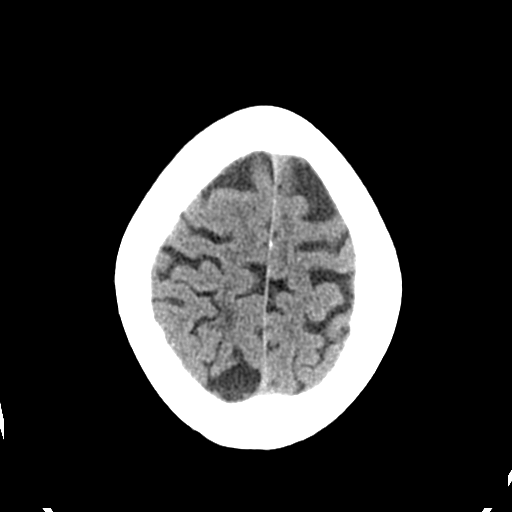
[im 26/30  brain]
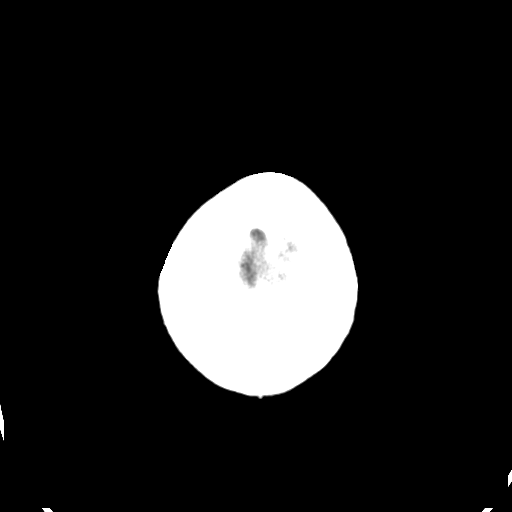

[Series 3: head bone · axial · 0.41mm/px · z∈[-147,-119]mm · 3 of 74 slices shown]
[im 8/74  bone]
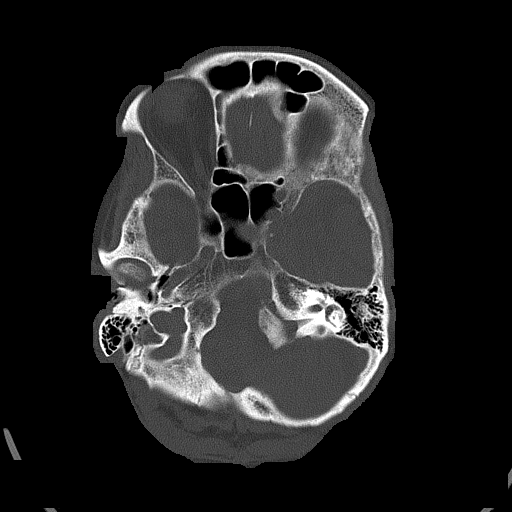
[im 15/74  bone]
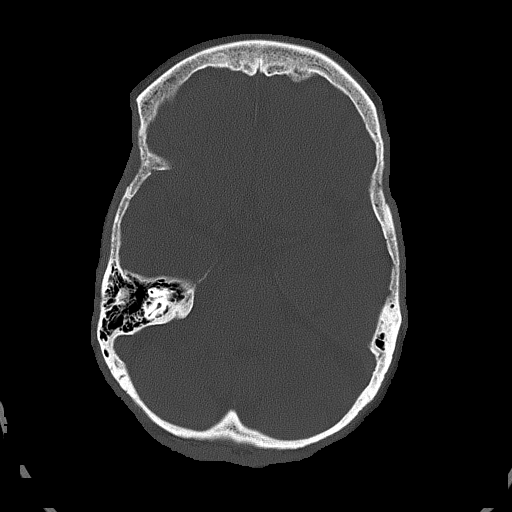
[im 22/74  bone]
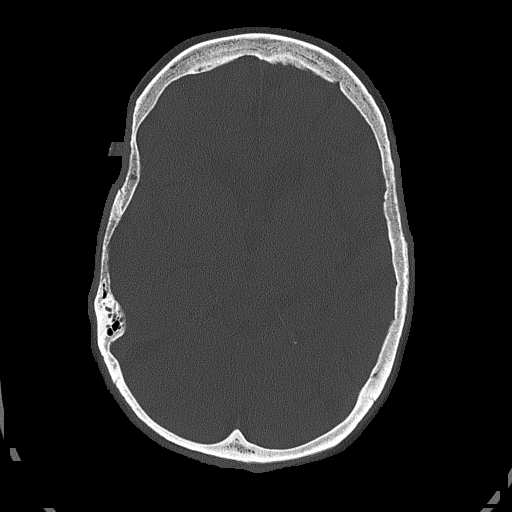

[Series 4: coronal soft tissue · coronal · 0.32mm/px · 3 of 69 slices shown]
[im 23/69  brain]
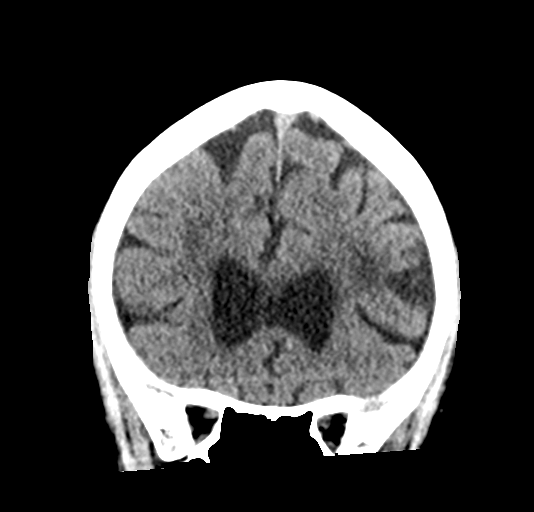
[im 31/69  brain]
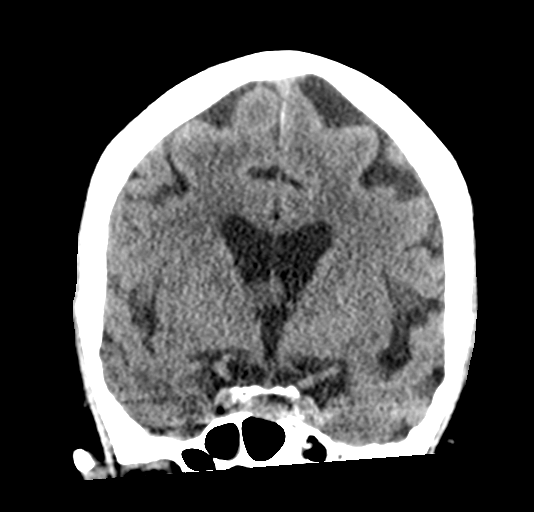
[im 38/69  brain]
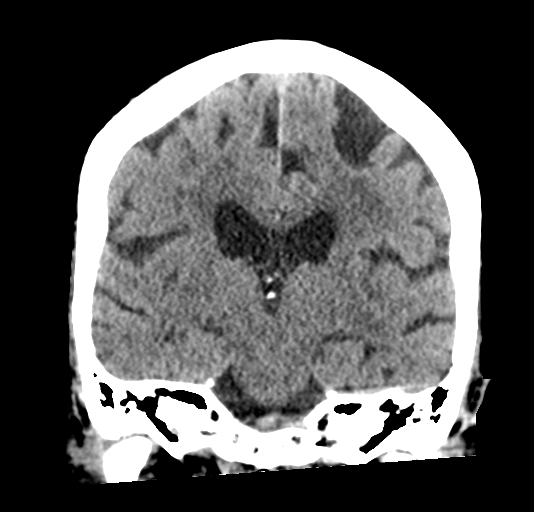

[Series 5: sagittal soft tissue · sagittal · 0.31mm/px · 3 of 49 slices shown]
[im 17/49  brain]
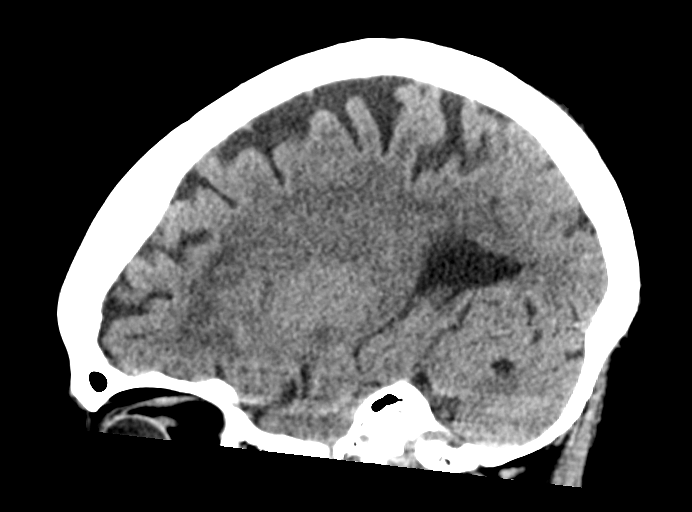
[im 25/49  brain]
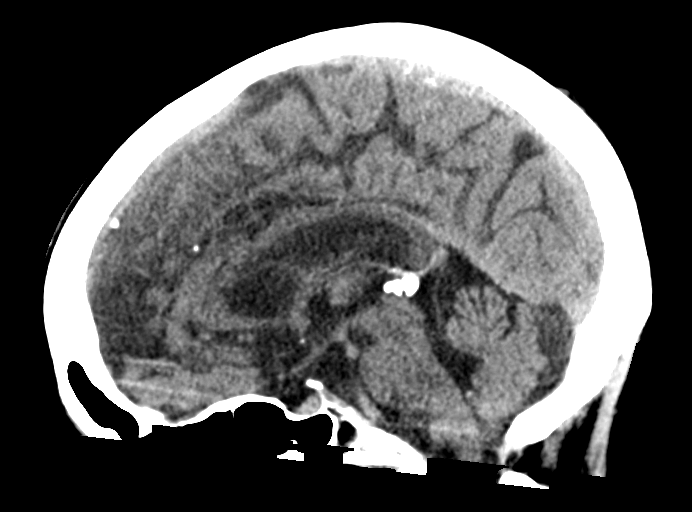
[im 33/49  brain]
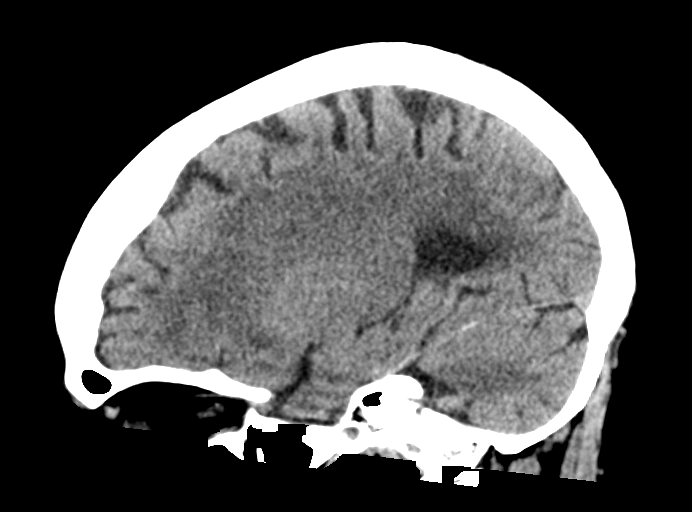

[16 of 47 positions shown; findings below may reference images not displayed]

FINDINGS: Brain: No subdural, epidural, or subarachnoid hemorrhage. There is a
lacunar infarct in the left cerebellar hemisphere. Cerebellum,
brainstem, and basal cisterns are otherwise normal. Ventricles and
sulci are prominent, consistent with volume loss. White matter
changes noted. An infarct in the left frontal lobe is suspected to
be nonacute. No other evidence of acute ischemia infarct. No mass
effect or midline shift.

Vascular: No hyperdense vessel or unexpected calcification.

Skull: Normal. Negative for fracture or focal lesion.

Sinuses/Orbits: No acute finding.

Other: None.
IMPRESSION: 1. Left frontal lobe infarct, favored to be nonacute. Chronic left
cerebellar hemisphere lacunar infarct. No other evidence of acute
abnormality.

## 2019-10-11 ENCOUNTER — Ambulatory Visit (INDEPENDENT_AMBULATORY_CARE_PROVIDER_SITE_OTHER): Payer: Medicare Other | Admitting: *Deleted

## 2019-10-11 DIAGNOSIS — I63423 Cerebral infarction due to embolism of bilateral anterior cerebral arteries: Secondary | ICD-10-CM | POA: Diagnosis not present

## 2019-10-11 LAB — CUP PACEART REMOTE DEVICE CHECK
Date Time Interrogation Session: 20210627234120
Implantable Pulse Generator Implant Date: 20191112

## 2019-10-12 NOTE — Progress Notes (Signed)
Carelink Summary Report / Loop Recorder 

## 2019-11-15 ENCOUNTER — Ambulatory Visit (INDEPENDENT_AMBULATORY_CARE_PROVIDER_SITE_OTHER): Payer: Medicare Other | Admitting: *Deleted

## 2019-11-15 DIAGNOSIS — I63423 Cerebral infarction due to embolism of bilateral anterior cerebral arteries: Secondary | ICD-10-CM | POA: Diagnosis not present

## 2019-11-16 LAB — CUP PACEART REMOTE DEVICE CHECK
Date Time Interrogation Session: 20210730234515
Implantable Pulse Generator Implant Date: 20191112

## 2019-11-17 NOTE — Progress Notes (Signed)
Carelink Summary Report / Loop Recorder 

## 2019-11-23 ENCOUNTER — Telehealth: Payer: Self-pay | Admitting: Family Medicine

## 2019-11-23 NOTE — Telephone Encounter (Signed)
-----   Message from Salvatore Marvel sent at 11/18/2019 12:40 PM EDT ----- Regarding: remove pcp [place] pt    Thanks

## 2019-11-23 NOTE — Telephone Encounter (Signed)
Provider relocated no longer with organization.

## 2019-12-16 ENCOUNTER — Ambulatory Visit (INDEPENDENT_AMBULATORY_CARE_PROVIDER_SITE_OTHER): Payer: Medicare Other | Admitting: *Deleted

## 2019-12-16 DIAGNOSIS — I63423 Cerebral infarction due to embolism of bilateral anterior cerebral arteries: Secondary | ICD-10-CM

## 2019-12-16 LAB — CUP PACEART REMOTE DEVICE CHECK
Date Time Interrogation Session: 20210901234902
Implantable Pulse Generator Implant Date: 20191112

## 2019-12-17 NOTE — Progress Notes (Signed)
Carelink Summary Report / Loop Recorder 

## 2020-01-18 ENCOUNTER — Ambulatory Visit (INDEPENDENT_AMBULATORY_CARE_PROVIDER_SITE_OTHER): Payer: Medicare Other

## 2020-01-18 DIAGNOSIS — I63423 Cerebral infarction due to embolism of bilateral anterior cerebral arteries: Secondary | ICD-10-CM

## 2020-01-18 LAB — CUP PACEART REMOTE DEVICE CHECK
Date Time Interrogation Session: 20211004235038
Implantable Pulse Generator Implant Date: 20191112

## 2020-01-20 NOTE — Progress Notes (Signed)
Carelink Summary Report / Loop Recorder 

## 2020-02-20 LAB — CUP PACEART REMOTE DEVICE CHECK
Date Time Interrogation Session: 20211107003744
Implantable Pulse Generator Implant Date: 20191112

## 2020-02-21 ENCOUNTER — Ambulatory Visit (INDEPENDENT_AMBULATORY_CARE_PROVIDER_SITE_OTHER): Payer: Medicare Other

## 2020-02-21 DIAGNOSIS — I63423 Cerebral infarction due to embolism of bilateral anterior cerebral arteries: Secondary | ICD-10-CM | POA: Diagnosis not present

## 2020-02-21 NOTE — Progress Notes (Signed)
Carelink Summary Report / Loop Recorder 

## 2020-03-25 LAB — CUP PACEART REMOTE DEVICE CHECK
Date Time Interrogation Session: 20211210013907
Implantable Pulse Generator Implant Date: 20191112

## 2020-03-27 ENCOUNTER — Ambulatory Visit (INDEPENDENT_AMBULATORY_CARE_PROVIDER_SITE_OTHER): Payer: Medicare Other

## 2020-03-27 DIAGNOSIS — I63423 Cerebral infarction due to embolism of bilateral anterior cerebral arteries: Secondary | ICD-10-CM

## 2020-04-10 NOTE — Progress Notes (Signed)
Carelink Summary Report / Loop Recorder 

## 2020-05-01 ENCOUNTER — Ambulatory Visit (INDEPENDENT_AMBULATORY_CARE_PROVIDER_SITE_OTHER): Payer: Medicare Other

## 2020-05-01 DIAGNOSIS — I63423 Cerebral infarction due to embolism of bilateral anterior cerebral arteries: Secondary | ICD-10-CM | POA: Diagnosis not present

## 2020-05-01 LAB — CUP PACEART REMOTE DEVICE CHECK
Date Time Interrogation Session: 20220112030132
Implantable Pulse Generator Implant Date: 20191112

## 2020-05-10 ENCOUNTER — Ambulatory Visit: Payer: Self-pay | Admitting: Adult Health

## 2020-05-13 NOTE — Progress Notes (Signed)
Carelink Summary Report / Loop Recorder 

## 2020-05-31 LAB — CUP PACEART REMOTE DEVICE CHECK
Date Time Interrogation Session: 20220214033210
Implantable Pulse Generator Implant Date: 20191112

## 2020-06-05 ENCOUNTER — Ambulatory Visit (INDEPENDENT_AMBULATORY_CARE_PROVIDER_SITE_OTHER): Payer: Medicare Other

## 2020-06-05 DIAGNOSIS — I639 Cerebral infarction, unspecified: Secondary | ICD-10-CM

## 2020-06-09 NOTE — Progress Notes (Signed)
Carelink Summary Report / Loop Recorder 

## 2020-07-08 LAB — CUP PACEART REMOTE DEVICE CHECK
Date Time Interrogation Session: 20220319043008
Implantable Pulse Generator Implant Date: 20191112

## 2020-07-10 ENCOUNTER — Ambulatory Visit (INDEPENDENT_AMBULATORY_CARE_PROVIDER_SITE_OTHER): Payer: Medicare Other

## 2020-07-10 DIAGNOSIS — I63423 Cerebral infarction due to embolism of bilateral anterior cerebral arteries: Secondary | ICD-10-CM | POA: Diagnosis not present

## 2020-07-15 ENCOUNTER — Inpatient Hospital Stay: Payer: Medicare Other

## 2020-07-15 ENCOUNTER — Emergency Department: Payer: Medicare Other

## 2020-07-15 ENCOUNTER — Inpatient Hospital Stay
Admission: EM | Admit: 2020-07-15 | Discharge: 2020-07-17 | DRG: 312 | Disposition: A | Discharge status: Home Health | Payer: Medicare Other | Attending: Family Medicine | Admitting: Family Medicine

## 2020-07-15 ENCOUNTER — Encounter: Payer: Self-pay | Admitting: Family Medicine

## 2020-07-15 ENCOUNTER — Other Ambulatory Visit: Payer: Self-pay

## 2020-07-15 DIAGNOSIS — R9089 Other abnormal findings on diagnostic imaging of central nervous system: Secondary | ICD-10-CM

## 2020-07-15 DIAGNOSIS — I214 Non-ST elevation (NSTEMI) myocardial infarction: Secondary | ICD-10-CM | POA: Diagnosis present

## 2020-07-15 DIAGNOSIS — C761 Malignant neoplasm of thorax: Secondary | ICD-10-CM | POA: Diagnosis not present

## 2020-07-15 DIAGNOSIS — Z91138 Patient's unintentional underdosing of medication regimen for other reason: Secondary | ICD-10-CM | POA: Diagnosis not present

## 2020-07-15 DIAGNOSIS — M898X8 Other specified disorders of bone, other site: Secondary | ICD-10-CM

## 2020-07-15 DIAGNOSIS — E039 Hypothyroidism, unspecified: Secondary | ICD-10-CM | POA: Diagnosis present

## 2020-07-15 DIAGNOSIS — I959 Hypotension, unspecified: Secondary | ICD-10-CM | POA: Diagnosis not present

## 2020-07-15 DIAGNOSIS — Z20822 Contact with and (suspected) exposure to covid-19: Secondary | ICD-10-CM | POA: Diagnosis not present

## 2020-07-15 DIAGNOSIS — G43909 Migraine, unspecified, not intractable, without status migrainosus: Secondary | ICD-10-CM | POA: Diagnosis not present

## 2020-07-15 DIAGNOSIS — R930 Abnormal findings on diagnostic imaging of skull and head, not elsewhere classified: Secondary | ICD-10-CM | POA: Diagnosis not present

## 2020-07-15 DIAGNOSIS — N200 Calculus of kidney: Secondary | ICD-10-CM | POA: Diagnosis not present

## 2020-07-15 DIAGNOSIS — I10 Essential (primary) hypertension: Secondary | ICD-10-CM | POA: Diagnosis present

## 2020-07-15 DIAGNOSIS — R4182 Altered mental status, unspecified: Secondary | ICD-10-CM | POA: Diagnosis not present

## 2020-07-15 DIAGNOSIS — Z8042 Family history of malignant neoplasm of prostate: Secondary | ICD-10-CM | POA: Diagnosis not present

## 2020-07-15 DIAGNOSIS — E785 Hyperlipidemia, unspecified: Secondary | ICD-10-CM | POA: Diagnosis not present

## 2020-07-15 DIAGNOSIS — C4489 Other specified malignant neoplasm of overlapping sites of skin: Secondary | ICD-10-CM | POA: Diagnosis not present

## 2020-07-15 DIAGNOSIS — D696 Thrombocytopenia, unspecified: Secondary | ICD-10-CM | POA: Diagnosis not present

## 2020-07-15 DIAGNOSIS — N289 Disorder of kidney and ureter, unspecified: Secondary | ICD-10-CM | POA: Diagnosis not present

## 2020-07-15 DIAGNOSIS — N281 Cyst of kidney, acquired: Secondary | ICD-10-CM | POA: Diagnosis not present

## 2020-07-15 DIAGNOSIS — C44591 Other specified malignant neoplasm of skin of breast: Secondary | ICD-10-CM | POA: Diagnosis present

## 2020-07-15 DIAGNOSIS — K3189 Other diseases of stomach and duodenum: Secondary | ICD-10-CM | POA: Diagnosis not present

## 2020-07-15 DIAGNOSIS — K5909 Other constipation: Secondary | ICD-10-CM | POA: Diagnosis present

## 2020-07-15 DIAGNOSIS — Z8249 Family history of ischemic heart disease and other diseases of the circulatory system: Secondary | ICD-10-CM

## 2020-07-15 DIAGNOSIS — R55 Syncope and collapse: Secondary | ICD-10-CM | POA: Diagnosis not present

## 2020-07-15 DIAGNOSIS — L98499 Non-pressure chronic ulcer of skin of other sites with unspecified severity: Secondary | ICD-10-CM | POA: Diagnosis not present

## 2020-07-15 DIAGNOSIS — R42 Dizziness and giddiness: Secondary | ICD-10-CM | POA: Diagnosis not present

## 2020-07-15 DIAGNOSIS — E872 Acidosis: Secondary | ICD-10-CM | POA: Diagnosis present

## 2020-07-15 DIAGNOSIS — Z7982 Long term (current) use of aspirin: Secondary | ICD-10-CM

## 2020-07-15 DIAGNOSIS — I639 Cerebral infarction, unspecified: Secondary | ICD-10-CM | POA: Diagnosis present

## 2020-07-15 DIAGNOSIS — T466X6A Underdosing of antihyperlipidemic and antiarteriosclerotic drugs, initial encounter: Secondary | ICD-10-CM | POA: Diagnosis not present

## 2020-07-15 DIAGNOSIS — Z8673 Personal history of transient ischemic attack (TIA), and cerebral infarction without residual deficits: Secondary | ICD-10-CM

## 2020-07-15 DIAGNOSIS — Z79899 Other long term (current) drug therapy: Secondary | ICD-10-CM | POA: Diagnosis not present

## 2020-07-15 DIAGNOSIS — E782 Mixed hyperlipidemia: Secondary | ICD-10-CM | POA: Diagnosis not present

## 2020-07-15 DIAGNOSIS — I951 Orthostatic hypotension: Principal | ICD-10-CM | POA: Diagnosis present

## 2020-07-15 LAB — COMPREHENSIVE METABOLIC PANEL WITH GFR
ALT: 12 U/L (ref 0–44)
AST: 29 U/L (ref 15–41)
Albumin: 3.1 g/dL — ABNORMAL LOW (ref 3.5–5.0)
Alkaline Phosphatase: 67 U/L (ref 38–126)
Anion gap: 7 (ref 5–15)
BUN: 19 mg/dL (ref 8–23)
CO2: 25 mmol/L (ref 22–32)
Calcium: 8.7 mg/dL — ABNORMAL LOW (ref 8.9–10.3)
Chloride: 110 mmol/L (ref 98–111)
Creatinine, Ser: 1.08 mg/dL — ABNORMAL HIGH (ref 0.44–1.00)
GFR, Estimated: 52 mL/min — ABNORMAL LOW
Glucose, Bld: 155 mg/dL — ABNORMAL HIGH (ref 70–99)
Potassium: 3.8 mmol/L (ref 3.5–5.1)
Sodium: 142 mmol/L (ref 135–145)
Total Bilirubin: 0.8 mg/dL (ref 0.3–1.2)
Total Protein: 6.3 g/dL — ABNORMAL LOW (ref 6.5–8.1)

## 2020-07-15 LAB — CBG MONITORING, ED: Glucose-Capillary: 133 mg/dL — ABNORMAL HIGH (ref 70–99)

## 2020-07-15 LAB — CBC WITH DIFFERENTIAL/PLATELET
Abs Immature Granulocytes: 0.02 10*3/uL (ref 0.00–0.07)
Basophils Absolute: 0.1 10*3/uL (ref 0.0–0.1)
Basophils Relative: 1 %
Eosinophils Absolute: 0.3 10*3/uL (ref 0.0–0.5)
Eosinophils Relative: 4 %
HCT: 39.4 % (ref 36.0–46.0)
Hemoglobin: 12.7 g/dL (ref 12.0–15.0)
Immature Granulocytes: 0 %
Lymphocytes Relative: 39 %
Lymphs Abs: 3.3 10*3/uL (ref 0.7–4.0)
MCH: 31.5 pg (ref 26.0–34.0)
MCHC: 32.2 g/dL (ref 30.0–36.0)
MCV: 97.8 fL (ref 80.0–100.0)
Monocytes Absolute: 0.5 10*3/uL (ref 0.1–1.0)
Monocytes Relative: 5 %
Neutro Abs: 4.3 10*3/uL (ref 1.7–7.7)
Neutrophils Relative %: 51 %
Platelets: 135 10*3/uL — ABNORMAL LOW (ref 150–400)
RBC: 4.03 MIL/uL (ref 3.87–5.11)
RDW: 12.9 % (ref 11.5–15.5)
WBC: 8.4 10*3/uL (ref 4.0–10.5)
nRBC: 0 % (ref 0.0–0.2)

## 2020-07-15 LAB — URINE DRUG SCREEN, QUALITATIVE (ARMC ONLY)
Amphetamines, Ur Screen: NOT DETECTED
Barbiturates, Ur Screen: NOT DETECTED
Benzodiazepine, Ur Scrn: NOT DETECTED
Cannabinoid 50 Ng, Ur ~~LOC~~: NOT DETECTED
Cocaine Metabolite,Ur ~~LOC~~: NOT DETECTED
MDMA (Ecstasy)Ur Screen: NOT DETECTED
Methadone Scn, Ur: NOT DETECTED
Opiate, Ur Screen: NOT DETECTED
Phencyclidine (PCP) Ur S: NOT DETECTED
Tricyclic, Ur Screen: NOT DETECTED

## 2020-07-15 LAB — RESP PANEL BY RT-PCR (FLU A&B, COVID) ARPGX2
Influenza A by PCR: NEGATIVE
Influenza B by PCR: NEGATIVE
SARS Coronavirus 2 by RT PCR: NEGATIVE

## 2020-07-15 LAB — URINALYSIS, COMPLETE (UACMP) WITH MICROSCOPIC
Bilirubin Urine: NEGATIVE
Glucose, UA: 50 mg/dL — AB
Hgb urine dipstick: NEGATIVE
Ketones, ur: NEGATIVE mg/dL
Nitrite: NEGATIVE
Protein, ur: 30 mg/dL — AB
Specific Gravity, Urine: 1.02 (ref 1.005–1.030)
pH: 5 (ref 5.0–8.0)

## 2020-07-15 LAB — TROPONIN I (HIGH SENSITIVITY)
Troponin I (High Sensitivity): 4 ng/L
Troponin I (High Sensitivity): 7 ng/L (ref <18)

## 2020-07-15 LAB — LACTIC ACID, PLASMA
Lactic Acid, Venous: 3.1 mmol/L (ref 0.5–1.9)
Lactic Acid, Venous: 3.3 mmol/L (ref 0.5–1.9)
Lactic Acid, Venous: 3.8 mmol/L (ref 0.5–1.9)

## 2020-07-15 MED ORDER — ACETAMINOPHEN 160 MG/5ML PO SOLN: 650.0000 mg | ORAL | Status: DC | PRN

## 2020-07-15 MED ORDER — LACTATED RINGERS IV BOLUS
1000.0000 mL | Freq: Once | INTRAVENOUS | Status: AC
  Administered 2020-07-15: 1000 mL via INTRAVENOUS

## 2020-07-15 MED ORDER — STROKE: EARLY STAGES OF RECOVERY BOOK: Freq: Once | Status: AC

## 2020-07-15 MED ORDER — PIPERACILLIN-TAZOBACTAM 3.375 G IVPB 30 MIN
3.3750 g | Freq: Once | INTRAVENOUS | Status: AC
  Administered 2020-07-15: 3.375 g via INTRAVENOUS
  Filled 2020-07-15: qty 50

## 2020-07-15 MED ORDER — ACETAMINOPHEN 650 MG RE SUPP: 650.0000 mg | RECTAL | Status: DC | PRN

## 2020-07-15 MED ORDER — ENOXAPARIN SODIUM 40 MG/0.4ML ~~LOC~~ SOLN
40.0000 mg | SUBCUTANEOUS | Status: DC
  Administered 2020-07-15: 40 mg via SUBCUTANEOUS
  Filled 2020-07-15: qty 0.4

## 2020-07-15 MED ORDER — LACTATED RINGERS IV SOLN: INTRAVENOUS | Status: AC

## 2020-07-15 MED ORDER — VITAMIN D 25 MCG (1000 UNIT) PO TABS
2000.0000 [IU] | ORAL_TABLET | Freq: Every day | ORAL | Status: DC
  Administered 2020-07-16 – 2020-07-17 (×2): 2000 [IU] via ORAL
  Filled 2020-07-15 (×2): qty 2

## 2020-07-15 MED ORDER — ADULT MULTIVITAMIN W/MINERALS CH
1.0000 | ORAL_TABLET | Freq: Every day | ORAL | Status: DC
  Administered 2020-07-16 – 2020-07-17 (×2): 1 via ORAL
  Filled 2020-07-15 (×2): qty 1

## 2020-07-15 MED ORDER — IOHEXOL 300 MG/ML  SOLN
100.0000 mL | Freq: Once | INTRAMUSCULAR | Status: AC | PRN
  Administered 2020-07-15: 100 mL via INTRAVENOUS

## 2020-07-15 MED ORDER — SODIUM CHLORIDE 0.9 % IV BOLUS
1000.0000 mL | Freq: Once | INTRAVENOUS | Status: AC
  Administered 2020-07-15: 1000 mL via INTRAVENOUS

## 2020-07-15 MED ORDER — ASPIRIN EC 81 MG PO TBEC
81.0000 mg | DELAYED_RELEASE_TABLET | Freq: Every day | ORAL | Status: DC
  Administered 2020-07-16: 09:00:00 81 mg via ORAL
  Filled 2020-07-15: qty 1

## 2020-07-15 MED ORDER — ACETAMINOPHEN 325 MG PO TABS: 650.0000 mg | ORAL_TABLET | ORAL | Status: DC | PRN

## 2020-07-15 NOTE — Consult Note (Signed)
Subjective:   CC: Lactic acidosis and questionable pneumoperitoneum on chest x-ray  HPI:  Kaylee [name] is a 80 y.o. female who was consulted by [facility] for issue above.    Patient presented with sudden onset syncope while eating with family members earlier today, brought in by EMS and work-up included a chest x-ray which showed possible pneumoperitoneum.  Surgery consulted for evaluation of this pneumoperitoneum.  At time of exam, patient alert and oriented x3.  Able to report the history as noted above.  She states that she is completely asymptomatic, denying any pain, nausea, vomiting.  She does not endorse any symptoms prior to her syncopal episode, and no recent changes in health.  She does deal with chronic constipation but otherwise does not have other GI complaints.    Past Medical History:  has a past medical history of Depression, Fainting spell, Frequent headaches, Hyperlipidemia, Hypertension, Migraine, Stroke ([place]), Thyroid disease, and UTI (urinary tract infection).  Past Surgical History:  Past Surgical History:  Procedure Laterality Date  . LOOP RECORDER INSERTION N/A 02/24/2018   Procedure: LOOP RECORDER INSERTION;  Surgeon: Deboraha Sprang, MD;  Location: [facility];  Service: Cardiovascular;  Laterality: N/A;  . TEE WITHOUT CARDIOVERSION N/A 02/24/2018   Procedure: TRANSESOPHAGEAL ECHOCARDIOGRAM (TEE);  Surgeon: Minna Merritts, MD;  Location: [facility];  Service: Cardiovascular;  Laterality: N/A;    Family History: family history includes Dementia in her mother; Hypertension in her mother; Hypothyroidism in her mother; Prostate cancer in her father.  Social History:  reports that she has never smoked. She has never used smokeless tobacco. She reports previous alcohol use. She reports previous drug use.  Current Medications:  Prior to Admission medications   Medication Sig Start Date End Date Taking? Authorizing Provider  aspirin EC 81 MG EC tablet  Take 1 tablet (81 mg total) by mouth daily. 02/08/18   Loletha Grayer, MD  atorvastatin (LIPITOR) 40 MG tablet Take 1 tablet (40 mg total) by mouth daily at 6 PM. 03/22/19   Leone Haven, MD  Cholecalciferol (VITAMIN D3) 50 MCG (2000 UT) capsule Take 1 capsule (2,000 Units total) by mouth daily. 03/24/18   Jodelle Green, FNP  Multiple Vitamin (MULTIVITAMIN WITH MINERALS) TABS tablet Take 1 tablet by mouth daily.    [provider]    Allergies:  Allergies as of 07/15/2020  . (No Known Allergies)    ROS:  General: Denies weight loss, weight gain, fatigue, fevers, chills, and night sweats. Eyes: Denies blurry vision, double vision, eye pain, itchy eyes, and tearing. Ears: Denies hearing loss, earache, and ringing in ears. Nose: Denies sinus pain, congestion, infections, runny nose, and nosebleeds. Mouth/throat: Denies hoarseness, sore throat, bleeding gums, and difficulty swallowing. Heart: Denies chest pain, palpitations, racing heart, irregular heartbeat, leg pain or swelling, and decreased activity tolerance. Respiratory: Denies breathing difficulty, shortness of breath, wheezing, cough, and sputum. GI: Denies change in appetite, heartburn, nausea, vomiting, constipation, diarrhea, and blood in stool. GU: Denies difficulty urinating, pain with urinating, urgency, frequency, blood in urine. Musculoskeletal: Denies joint stiffness, pain, swelling, muscle weakness. Skin: Denies rash, itching, mass, tumors, sores, and boils Neurologic: Denies headache, fainting, dizziness, seizures, numbness, and tingling. Psychiatric: Denies depression, anxiety, difficulty sleeping, and memory loss. Endocrine: Denies heat or cold intolerance, and increased thirst or urination. Blood/lymph: Denies easy bruising, easy bruising, and swollen glands     Objective:     BP 138/74   Pulse 77   Temp 98 F (  36.7 C)   Resp 17   Ht 5\' 4"  (1.626 m)   Wt 66 kg   SpO2 100%   BMI 24.98 kg/m    Constitutional :  alert, cooperative, appears stated age and no distress  Lymphatics/Throat:  no asymmetry, masses, or scars  Respiratory:  clear to auscultation bilaterally  Cardiovascular:  regular rate and rhythm  Gastrointestinal: soft, non-tender; bowel sounds normal; no masses,  no organomegaly.   Musculoskeletal: Steady movement  Skin: Cool and moist.  Upper chest, right sternal area, with chronic skin discoloration and a very firm nodular-like lesion noted.  No active discharge, no tenderness to palpation.  Area measures roughly 3 cm x 3-1/2 cm.  Nodular-like lesion deep to the skin is extremely firm, immobile.  Psychiatric: Normal affect, non-agitated, not confused       LABS:  CMP Latest Ref Rng & Units 07/15/2020 02/05/2018 04/05/2013  Glucose 70 - 99 mg/dL 155(H) 146(H) 78  BUN 8 - 23 mg/dL 19 19 14   Creatinine 0.44 - 1.00 mg/dL 1.08(H) 1.00 0.82  Sodium 135 - 145 mmol/L 142 139 137  Potassium 3.5 - 5.1 mmol/L 3.8 4.0 3.6  Chloride 98 - 111 mmol/L 110 105 102  CO2 22 - 32 mmol/L 25 26 30   Calcium 8.9 - 10.3 mg/dL 8.7(L) 9.3 9.9  Total Protein 6.5 - 8.1 g/dL 6.3(L) - 7.8  Total Bilirubin 0.3 - 1.2 mg/dL 0.8 - 1.4(H)  Alkaline Phos 38 - 126 U/L 67 - 64  AST 15 - 41 U/L 29 - 27  ALT 0 - 44 U/L 12 - 15   CBC Latest Ref Rng & Units 07/15/2020 03/23/2018 02/05/2018  WBC 4.0 - 10.5 K/uL 8.4 7.6 6.7  Hemoglobin 12.0 - 15.0 g/dL 12.7 13.5 13.6  Hematocrit 36.0 - 46.0 % 39.4 41.1 41.4  Platelets 150 - 400 K/uL 135(L) 153.0 117(L)  Lactic acid of 3.1  RADS: CLINICAL DATA:  Altered mental status and syncopal episode. Hypotension.  EXAM: PORTABLE CHEST 1 VIEW  COMPARISON:  None.  FINDINGS: The heart size and mediastinal contours are normal aside from mild aortic tortuosity. The lungs appear clear. There is no pleural effusion or pneumothorax. Air is noted beneath the diaphragm bilaterally consistent with pneumoperitoneum. There is a probable old fracture of the left  5th rib anteriorly. No acute osseous findings are evident. Telemetry leads and a loop recorder overlie the chest.  IMPRESSION: 1. Pneumoperitoneum implying a ruptured abdominal viscus in the absence of recent surgery or penetrating injury. Surgical consultation recommended. Abdominopelvic CT may be helpful for further evaluation. 2. No acute chest findings. 3. Critical Value/emergent results were called by telephone at the time of interpretation on 07/15/2020 at 12:43 pm to provider [facility] , who verbally acknowledged these results.   Electronically Signed   By: Richardean Sale M.D.   On: 07/15/2020 12:45  CLINICAL DATA:  Altered mental status with syncopal episode and hypotension. Pneumoperitoneum on chest radiographs.  EXAM: CT ABDOMEN AND PELVIS WITH CONTRAST  TECHNIQUE: Multidetector CT imaging of the abdomen and pelvis was performed using the standard protocol following bolus administration of intravenous contrast.  CONTRAST:  137mL OMNIPAQUE IOHEXOL 300 MG/ML  SOLN  COMPARISON:  Chest radiographs same date. Abdominopelvic CT 11/08/2012.  FINDINGS: Lower chest: Clear lung bases. No significant pleural or pericardial effusion. No basilar pneumothorax. Mild aortic atherosclerosis.  Hepatobiliary: The liver is normal in density without suspicious focal abnormality. No evidence of gallstones, gallbladder wall thickening or biliary dilatation.  Pancreas: Unremarkable.  No pancreatic ductal dilatation or surrounding inflammatory changes.  Spleen: Normal in size without focal abnormality.  Adrenals/Urinary Tract: Both adrenal glands appear normal. There is a small nonobstructing calculus in the lower pole of the left kidney. There are small bilateral renal cysts which have mildly enlarged compared with the previous study. Renal sinus cysts are present bilaterally without hydronephrosis or delayed contrast excretion. In the lower pole of the right  kidney, there is an ill-defined low-density lesion measuring 1.6 x 1.5 cm on image 37/2. This is an indeterminate finding which could reflect a complex cyst, small mass or focal inflammation. The bladder is decompressed by a Foley catheter.  Stomach/Bowel: There is a large amount of ingested material within the stomach. There is moderate stool throughout the colon. No bowel wall thickening, significant distention or surrounding inflammation. The appendix appears normal.  Vascular/Lymphatic: There are no enlarged abdominal or pelvic lymph nodes. No acute vascular findings. Mild aortic and branch vessel atherosclerosis. The portal, superior mesenteric and splenic veins are patent.  Reproductive: Stable globular appearance of the uterus suspicious for small fibroids. No adnexal mass.  Other: There is no free intraperitoneal air. Suggested pneumoperitoneum on prior chest radiographs is attributed to gas in the colon and stomach. There is colonic interposition between the liver and right hemidiaphragm. No ascites or focal extraluminal fluid collection.  Musculoskeletal: No acute or significant osseous findings. There are degenerative changes throughout the lumbar spine. There is a chronic Schmorl's node involving the inferior endplate of L1 which appears grossly stable.  IMPRESSION: 1. No evidence of pneumoperitoneum or perforated abdominal viscus. The suggested abnormality on prior chest radiograph is due to luminal gas in the stomach and colon. 2. Indeterminate small lesion involving the lower pole of the right kidney with differential considerations including focal inflammation, complex cyst and solid mass. Correlation with urine analysis recommended. Recommend follow-up dedicated renal MRI or CT with and without contrast. 3. No other acute findings. Prominent ingested material in the stomach and prominent stool throughout the colon. 4. Nonobstructing left renal  calculus. 5. Mild Aortic Atherosclerosis (ICD10-I70.0).   Electronically Signed   By: Richardean Sale M.D.   On: 07/15/2020 14:33  CLINICAL DATA:  Mental status change.  EXAM: CT HEAD WITHOUT CONTRAST  TECHNIQUE: Contiguous axial images were obtained from the base of the skull through the vertex without intravenous contrast.  COMPARISON:  CT 02/05/2018. MRI 02/06/2018  FINDINGS: Brain: New area of hypoattenuation and loss of gray-white differentiation in the right frontal lobe. Faint linear hyperdensity along the superior/anterior aspect (series 2, image 21). Additional small focal areas of loss of gray-white differentiation in the high bilateral posterior frontal lobes (series 2, image 25 and series 4, image 49; series 2 image 26 and series 4 image 53) also concerning for acute or subacute infarcts. No substantial mass effect. Similar encephalomalacia in the left frontal lobe. Similar remote left cerebellar lacunar infarct. 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 Otherwise, no mass lesion. No midline shift. No mass occupying hemorrhage. Additional patchy white matter hypoattenuation, nonspecific but most likely related to chronic microvascular ischemic disease. Mild generalized cerebral volume loss with ex vacuo ventricular dilation. No hydrocephalus.  Vascular: No hyperdense vessel identified.  Skull: No acute fracture.  Sinuses/Orbits: No acute findings.  Other: No mastoid effusions.  IMPRESSION: Multiple new areas of hypoattenuation and loss of gray-white differentiation in the anterior right frontal lobe and high bilateral posterior frontal lobes, concerning for acute or subacute infarcts.  Faint linear hyperdensity anteriorly in the right frontal lobe may represent trace petechial hemorrhage. No substantial mass effect. Recommend MRI to further evaluate.   Electronically Signed   By:  Margaretha Sheffield MD   On: 07/15/2020 13:40  Assessment:    Syncopal episode Lactic acidosis Incidental sternal nodule   Plan:   CT scan report completed after initial evaluation.  Confirms all gas initially seen on chest x-ray is intraluminal.  Physical exam is consistent with findings as well.  No acute surgical intervention is needed.  Regarding her incidental sternal nodule, will start with a ultrasound to assess the depth of this incidental finding.  Differential at this point includes neoplasm versus bony abnormality versus chronic cyst.  Patient states this has been present for close to a year with only gradual increase in size.  Denies any trauma to that area.    May benefit from excisional versus incisional biopsy after work-up for her syncopal episode and lactic acidosis is completed.  Further care regarding her syncopal episode and lactic acidosis work-up per medical team.  Surgery will follow up on the ultrasound results once available.

## 2020-07-15 NOTE — ED Provider Notes (Addendum)
-----------------------------------------   1:49 PM on 07/15/2020 -----------------------------------------  I have personally seen and evaluated the patient in conjunction with physician assistant Betha Loa.  I reviewed the patient's x-ray images that do appear to show free air on my evaluation.  We have ordered a CT scan with IV contrast (without oral contrast) to further evaluate.  Patient has a completely benign abdominal exam on my exam she does have some tympanic percussion to the upper abdomen but has no tenderness to palpation.  Patient's lab work shows a normal white blood cell count but does show an elevated lactic acid.  Patient receiving broad-spectrum antibiotics.  I spoke to CT they are coming to get the patient momentarily for her imaging.  If free air is present on CT imaging we will discuss with surgery.  I spoke to the patient's sister, the patient's son is a power of attorney who is in the waiting room currently.  Patient CT scan is read with intraluminal air, no free air.  However the patient CT scan of the head appears show acute or subacute infarcts.  Patient will need to be admitted regardless for further CVA work-up.   Harvest Dark, MD 07/15/20 1525    Harvest Dark, MD 07/15/20 [phone]

## 2020-07-15 NOTE — ED Provider Notes (Signed)
[facility] Emergency Department Provider Note  ____________________________________________  Time seen: Approximately 12:20 PM  I have reviewed the triage vital signs and the nursing notes.   HISTORY  Chief Complaint Loss of Consciousness and Altered Mental Status    HPI Kaylee [name] is a 80 y.o. female who presents the emergency department via EMS from home for altered mental status and syncope.  According to the patient and her family, patient was sitting down having lunch with family members when she had a period of altered mental status.  Directly after this, patient syncopized.  She did not fall or hit her head.  She regained consciousness and patient remembers the events leading up to this episode.  She denies any precipitating headache, visual changes, chest pain, shortness of breath, abdominal pain, nausea or vomiting before this episode.  Patient currently is denying any complaints.  She denies fevers, chills, nasal congestion, sore throat, cough,  chest pain, shortness of breath, abdominal pain, nausea vomiting, diarrhea or constipation.  No urinary symptoms to include dysuria, polyuria or hematuria.  On fire department arrival, they found that the patient was hypotensive with a systolic blood pressure of 60 and unable to obtain a diastolic pressure.  EMS arrived and found systolic pressure in the 15Q.  Patient arrived with blood pressure in the 110s over 70s.  Patient had received 450 mL of saline via EMS prior to arrival.  Patient does have a history of syncope, hyperlipidemia, hypertension, migraine, CVA, UTI.        Past Medical History:  Diagnosis Date  . Depression   . Fainting spell   . Frequent headaches   . Hyperlipidemia   . Hypertension   . Migraine   . Stroke ([place])   . Thyroid disease   . UTI (urinary tract infection)     Patient Active Problem List   Diagnosis Date Noted  . Syncope 07/15/2020  . Essential hypertension 02/10/2018   . Hypothyroidism 02/10/2018  . Hyperlipidemia 02/10/2018  . CVA (cerebral vascular accident) ([place]) 02/05/2018    Past Surgical History:  Procedure Laterality Date  . LOOP RECORDER INSERTION N/A 02/24/2018   Procedure: LOOP RECORDER INSERTION;  Surgeon: Deboraha Sprang, MD;  Location: [facility];  Service: Cardiovascular;  Laterality: N/A;  . TEE WITHOUT CARDIOVERSION N/A 02/24/2018   Procedure: TRANSESOPHAGEAL ECHOCARDIOGRAM (TEE);  Surgeon: Minna Merritts, MD;  Location: [facility] ORS;  Service: Cardiovascular;  Laterality: N/A;    Prior to Admission medications   Medication Sig Start Date End Date Taking? Authorizing Provider  aspirin EC 81 MG EC tablet Take 1 tablet (81 mg total) by mouth daily. 02/08/18  Yes Wieting, Richard, MD  Cholecalciferol (VITAMIN D3) 50 MCG (2000 UT) capsule Take 1 capsule (2,000 Units total) by mouth daily. 03/24/18  Yes Guse, Jacquelynn Cree, FNP  Multiple Vitamin (MULTIVITAMIN WITH MINERALS) TABS tablet Take 1 tablet by mouth daily.   Yes [provider]  atorvastatin (LIPITOR) 40 MG tablet Take 1 tablet (40 mg total) by mouth daily at 6 PM. Patient not taking: Reported on 07/15/2020 03/22/19   Leone Haven, MD    Allergies Patient has no known allergies.  Family History  Problem Relation Age of Onset  . Dementia Mother   . Hypertension Mother   . Hypothyroidism Mother   . Prostate cancer Father     Social History Social History   Tobacco Use  . Smoking status: Never Smoker  . Smokeless tobacco: Never Used  Vaping Use  . Vaping Use: Never used  Substance Use Topics  . Alcohol use: Not Currently  . Drug use: Not Currently     Review of Systems  Constitutional: No fever/chills Eyes: No visual changes. No discharge ENT: No upper respiratory complaints. Cardiovascular: no chest pain. Respiratory: no cough. No SOB. Gastrointestinal: No abdominal pain.  No nausea, no vomiting.  No diarrhea.  No constipation. Genitourinary:  Negative for dysuria. No hematuria Musculoskeletal: Negative for musculoskeletal pain. Skin: Negative for rash, abrasions, lacerations, ecchymosis. Neurological: Patient had a brief period of altered mental status, followed by syncopal episode.  Negative for headaches, focal weakness or numbness.  10 System ROS otherwise negative.  ____________________________________________   PHYSICAL EXAM:  VITAL SIGNS: ED Triage Vitals  Enc Vitals Group     BP 07/15/20 1214 (!) 148/72     Pulse Rate 07/15/20 1214 70     Resp --      Temp 07/15/20 1214 98 F (36.7 C)     Temp src --      SpO2 07/15/20 1214 99 %     Weight 07/15/20 1211 145 lb 8.1 oz (66 kg)     Height 07/15/20 1211 5\' 4"  (1.626 m)     Head Circumference --      Peak Flow --      Pain Score 07/15/20 1211 0     Pain Loc --      Pain Edu? --      Excl. in [place]? --      Constitutional: Alert and oriented.  Patient answering questions appropriately at this time.  Patient remembers events leading up to her syncope.  She remembers events following her syncopal episode.  Well appearing and in no acute distress. Eyes: Conjunctivae are normal. PERRL. EOMI. Head: Atraumatic. ENT:      Ears:       Nose: No congestion/rhinnorhea.      Mouth/Throat: Mucous membranes are moist.  Neck: No stridor.  No cervical spine tenderness to palpation. Hematological/Lymphatic/Immunilogical: No cervical lymphadenopathy. Cardiovascular: Normal rate, regular rhythm. Normal S1 and S2.  Good peripheral circulation. Respiratory: Normal respiratory effort without tachypnea or retractions. Lungs CTAB. Good air entry to the bases with no decreased or absent breath sounds. Gastrointestinal: No visible external abdominal wall findings.  Bowel sounds 4 quadrants. Soft and nontender to palpation in all quadrants.. No guarding or rigidity. No palpable masses. No distention. No CVA tenderness. Musculoskeletal: Full range of motion to all extremities. No gross  deformities appreciated. Neurologic:  Normal speech and language. No gross focal neurologic deficits are appreciated.  Cranial nerves II through XII grossly intact.  Negative Romberg's and pronator drift.  Equal grip strength bilateral upper extremities. Skin:  Skin is warm, dry and intact. No rash noted. Psychiatric: Mood and affect are normal. Speech and behavior are normal. Patient exhibits appropriate insight and judgement.   ____________________________________________   LABS (all labs ordered are listed, but only abnormal results are displayed)  Labs Reviewed  CBC WITH DIFFERENTIAL/PLATELET - Abnormal; Notable for the following components:      Result Value   Platelets 135 (*)    All other components within normal limits  COMPREHENSIVE METABOLIC PANEL - Abnormal; Notable for the following components:   Glucose, Bld 155 (*)    Creatinine, Ser 1.08 (*)    Calcium 8.7 (*)    Total Protein 6.3 (*)    Albumin 3.1 (*)    GFR, Estimated 52 (*)    All other  components within normal limits  URINALYSIS, COMPLETE (UACMP) WITH MICROSCOPIC - Abnormal; Notable for the following components:   Color, Urine YELLOW (*)    APPearance HAZY (*)    Glucose, UA 50 (*)    Protein, ur 30 (*)    Leukocytes,Ua SMALL (*)    Bacteria, UA RARE (*)    All other components within normal limits  LACTIC ACID, PLASMA - Abnormal; Notable for the following components:   Lactic Acid, Venous 3.1 (*)    All other components within normal limits  LACTIC ACID, PLASMA - Abnormal; Notable for the following components:   Lactic Acid, Venous 3.3 (*)    All other components within normal limits  CBG MONITORING, ED - Abnormal; Notable for the following components:   Glucose-Capillary 133 (*)    All other components within normal limits  RESP PANEL BY RT-PCR (FLU A&B, COVID) ARPGX2  URINE DRUG SCREEN, QUALITATIVE ([facility] ONLY)  TROPONIN I (HIGH SENSITIVITY)  TROPONIN I (HIGH SENSITIVITY)    ____________________________________________  EKG   ____________________________________________  RADIOLOGY I personally viewed and evaluated these images as part of my medical decision making, as well as reviewing the written report by the radiologist.  ED Provider Interpretation: Discussed the results of the chest x-ray with radiologist.  Patient has a pneumoperitoneum, suspicious for perforation/abdominal viscus rupture.  No recent intra-abdominal surgeries.  No penetrating trauma.  CT Head Wo Contrast  Result Date: 07/15/2020 CLINICAL DATA:  Mental status change. EXAM: CT HEAD WITHOUT CONTRAST TECHNIQUE: Contiguous axial images were obtained from the base of the skull through the vertex without intravenous contrast. COMPARISON:  CT 02/05/2018. MRI 02/06/2018 FINDINGS: Brain: New area of hypoattenuation and loss of gray-white differentiation in the right frontal lobe. Faint linear hyperdensity along the superior/anterior aspect (series 2, image 21). Additional small focal areas of loss of gray-white differentiation in the high bilateral posterior frontal lobes (series 2, image 25 and series 4, image 49; series 2 image 26 and series 4 image 53) also concerning for acute or subacute infarcts. No substantial mass effect. Similar encephalomalacia in the left frontal lobe. Similar remote left cerebellar lacunar infarct. 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 Otherwise, no mass lesion. No midline shift. No mass occupying hemorrhage. Additional patchy white matter hypoattenuation, nonspecific but most likely related to chronic microvascular ischemic disease. Mild generalized cerebral volume loss with ex vacuo ventricular dilation. No hydrocephalus. Vascular: No hyperdense vessel identified. Skull: No acute fracture. Sinuses/Orbits: No acute findings. Other: No mastoid effusions. IMPRESSION: Multiple new areas of hypoattenuation and loss of  gray-white differentiation in the anterior right frontal lobe and high bilateral posterior frontal lobes, concerning for acute or subacute infarcts. Faint linear hyperdensity anteriorly in the right frontal lobe may represent trace petechial hemorrhage. No substantial mass effect. Recommend MRI to further evaluate. Electronically Signed   By: Margaretha Sheffield MD   On: 07/15/2020 13:40   CT ABDOMEN PELVIS W CONTRAST  Result Date: 07/15/2020 CLINICAL DATA:  Altered mental status with syncopal episode and hypotension. Pneumoperitoneum on chest radiographs. EXAM: CT ABDOMEN AND PELVIS WITH CONTRAST TECHNIQUE: Multidetector CT imaging of the abdomen and pelvis was performed using the standard protocol following bolus administration of intravenous contrast. CONTRAST:  138mL OMNIPAQUE IOHEXOL 300 MG/ML  SOLN COMPARISON:  Chest radiographs same date. Abdominopelvic CT 11/08/2012. FINDINGS: Lower chest: Clear lung bases. No significant pleural or pericardial effusion. No basilar pneumothorax. Mild aortic atherosclerosis. Hepatobiliary: The liver is normal in density without suspicious  focal abnormality. No evidence of gallstones, gallbladder wall thickening or biliary dilatation. Pancreas: Unremarkable. No pancreatic ductal dilatation or surrounding inflammatory changes. Spleen: Normal in size without focal abnormality. Adrenals/Urinary Tract: Both adrenal glands appear normal. There is a small nonobstructing calculus in the lower pole of the left kidney. There are small bilateral renal cysts which have mildly enlarged compared with the previous study. Renal sinus cysts are present bilaterally without hydronephrosis or delayed contrast excretion. In the lower pole of the right kidney, there is an ill-defined low-density lesion measuring 1.6 x 1.5 cm on image 37/2. This is an indeterminate finding which could reflect a complex cyst, small mass or focal inflammation. The bladder is decompressed by a Foley catheter.  Stomach/Bowel: There is a large amount of ingested material within the stomach. There is moderate stool throughout the colon. No bowel wall thickening, significant distention or surrounding inflammation. The appendix appears normal. Vascular/Lymphatic: There are no enlarged abdominal or pelvic lymph nodes. No acute vascular findings. Mild aortic and branch vessel atherosclerosis. The portal, superior mesenteric and splenic veins are patent. Reproductive: Stable globular appearance of the uterus suspicious for small fibroids. No adnexal mass. Other: There is no free intraperitoneal air. Suggested pneumoperitoneum on prior chest radiographs is attributed to gas in the colon and stomach. There is colonic interposition between the liver and right hemidiaphragm. No ascites or focal extraluminal fluid collection. Musculoskeletal: No acute or significant osseous findings. There are degenerative changes throughout the lumbar spine. There is a chronic Schmorl's node involving the inferior endplate of L1 which appears grossly stable. IMPRESSION: 1. No evidence of pneumoperitoneum or perforated abdominal viscus. The suggested abnormality on prior chest radiograph is due to luminal gas in the stomach and colon. 2. Indeterminate small lesion involving the lower pole of the right kidney with differential considerations including focal inflammation, complex cyst and solid mass. Correlation with urine analysis recommended. Recommend follow-up dedicated renal MRI or CT with and without contrast. 3. No other acute findings. Prominent ingested material in the stomach and prominent stool throughout the colon. 4. Nonobstructing left renal calculus. 5. Mild Aortic Atherosclerosis (ICD10-I70.0). Electronically Signed   By: Richardean Sale M.D.   On: 07/15/2020 14:33   DG Chest Portable 1 View  Result Date: 07/15/2020 CLINICAL DATA:  Altered mental status and syncopal episode. Hypotension. EXAM: PORTABLE CHEST 1 VIEW COMPARISON:  None.  FINDINGS: The heart size and mediastinal contours are normal aside from mild aortic tortuosity. The lungs appear clear. There is no pleural effusion or pneumothorax. Air is noted beneath the diaphragm bilaterally consistent with pneumoperitoneum. There is a probable old fracture of the left 5th rib anteriorly. No acute osseous findings are evident. Telemetry leads and a loop recorder overlie the chest. IMPRESSION: 1. Pneumoperitoneum implying a ruptured abdominal viscus in the absence of recent surgery or penetrating injury. Surgical consultation recommended. Abdominopelvic CT may be helpful for further evaluation. 2. No acute chest findings. 3. Critical Value/emergent results were called by telephone at the time of interpretation on 07/15/2020 at 12:43 pm to provider [facility] , who verbally acknowledged these results. Electronically Signed   By: Richardean Sale M.D.   On: 07/15/2020 12:45    ____________________________________________    PROCEDURES  Procedure(s) performed:    Procedures    Medications  sodium chloride 0.9 % bolus 1,000 mL (0 mLs Intravenous Stopped 07/15/20 1458)  piperacillin-tazobactam (ZOSYN) IVPB 3.375 g (0 g Intravenous Stopped 07/15/20 1436)  iohexol (OMNIPAQUE) 300 MG/ML solution 100 mL (100 mLs Intravenous Contrast  Given 07/15/20 1402)   NIH Stroke Scale   Interval: Baseline Time: 5:56 PM Person Administering Scale: Charline Bills Rakeisha Nyce  Administer stroke scale items in the order listed. Record performance in each category after each subscale exam. Do not go back and change scores. Follow directions provided for each exam technique. Scores should reflect what the patient does, not what the clinician thinks the patient can do. The clinician should record answers while administering the exam and work quickly. Except where indicated, the patient should not be coached (i.e., repeated requests to patient to make a special effort).   1a  Level of consciousness:  0=alert; keenly responsive  1b. LOC questions:  0=Performs both tasks correctly  1c. LOC commands: 0=Performs both tasks correctly  2.  Best Gaze: 0=normal  3.  Visual: 0=No visual loss  4. Facial Palsy: 0=Normal symmetric movement  5a.  Motor left arm: 0=No drift, limb holds 90 (or 45) degrees for full 10 seconds  5b.  Motor right arm: 0=No drift, limb holds 90 (or 45) degrees for full 10 seconds  6a. motor left leg: 0=No drift, limb holds 90 (or 45) degrees for full 10 seconds  6b  Motor right leg:  0=No drift, limb holds 90 (or 45) degrees for full 10 seconds  7. Limb Ataxia: 0=Absent  8.  Sensory: 0=Normal; no sensory loss  9. Best Language:  0=No aphasia, normal  10. Dysarthria: 0=Normal  11. Extinction and Inattention: 0=No abnormality  12. Distal motor function: 0=Normal   Total:   0    ____________________________________________   INITIAL IMPRESSION / ASSESSMENT AND PLAN / ED COURSE  Pertinent labs & imaging results that were available during my care of the patient were reviewed by me and considered in my medical decision making (see chart for details).  Review of the [place] CSRS was performed in accordance of the [place] prior to dispensing any controlled drugs.  Clinical Course as of 07/15/20 1756  Sat Jul 15, 2020  [place] [KP]    Clinical Course User Index [KP] Harvest Dark, MD          Patient's diagnosis is consistent with CVA.  Patient presented to the emergency department for a brief period of altered mental status, then a syncopal episode.  Patient regained consciousness and was able to remember the details leading up to and following this event.  Patient was noted to be hypotensive initially on fire department arrival.  EMS had provided 450 mL of fluid to achieve a blood pressure reading of 110s over 70s.  Patient had no complaint on arrival.  She was neurologically intact.  Stated no recent illnesses.  No recent surgeries.  Again  patient was largely asymptomatic.  Physical exam was reassuring with no acute findings.  Initial work-up revealed findings concerning for pneumoperitoneum.  I discussed the case with on-call general surgeon, Dr. Lysle Pearl while waiting on the pending CT scan.  Thankfully, follow-up CT revealed no evidence of pneumoperitoneum, previous imaging was revealing for luminal gas in the intestine and stomach.  There was no evidence of perforation or other acute finding in the abdomen.  Possible lesion in the kidney was appreciated.  This will be followed up with imaging.  Patient did have findings on CT head of acute versus subacute CVA.  Again patient is largely asymptomatic currently.  Given these findings I feel that patient would be best suited with admission.  Hospitalist service contacted for admission and they agreed to admit  the patient for further evaluation.. Patient is given ED precautions to return to the ED for any worsening or new symptoms.     ____________________________________________  FINAL CLINICAL IMPRESSION(S) / ED DIAGNOSES  Final diagnoses:  Cerebrovascular accident (CVA), unspecified mechanism ([place])      NEW MEDICATIONS STARTED DURING THIS VISIT:  ED Discharge Orders    None          This chart was dictated using voice recognition software/Dragon. Despite best efforts to proofread, errors can occur which can change the meaning. Any change was purely unintentional.    Darletta Moll, PA-C 07/15/20 1756    Harvest Dark, MD 07/16/20 1650

## 2020-07-15 NOTE — H&P (Signed)
[facility] History and Physical  Kaylee [name] LHT:[id number] DOB: [birth year] DOA: 07/15/2020  Referring physician: Dr. Kerman Passey PCP: Pcp, No   Chief Complaint: syncope  HPI: Kaylee [name] is a 80 y.o. female with history of CVA in 2019, hypertension, hyperlipidemia, hypothyroidism, who presents after an episode of syncope from home.  Patient was admitted in October 2019 after an acute CVA involving bilateral occipital areas and left frontal area.  Review of chart has since followed regularly with Dr. Caryl Comes from cardiology, do not see any notes in our system for neurology.  Today reports she was having brunch with one of her brothers and one of her sisters around 10:00 when she had a sudden episode of unresponsiveness and syncope.  Per patient's history immediately before this she began to feel very unwell, "just totally drained of energy", and a little nauseous.  Sister is present during interview and reports that her sister suddenly stared off into the distance and then slumped down in her chair.  She denies seeing any shaking activity while this happened.  Sister and patient both deny any bowel or bladder incontinence during this episode.  Sister came to patient's side and rubbed her and was able to get her to wake up, reports she felt very sweaty and clammy.  EMS was subsequently called and she was brought to the hospital.  Patient currently denies having any weakness, numbness, tingling anywhere in her body.  She denies any shortness of breath or chest pain now or recently.  At baseline she is able to ambulate with a walker or cane and has not had any change in her functional ability of late.  Sister denies any personality changes recently and her sister.  In the ED initial vital signs notable only for mild hypertension.  Lab work-up showed unremarkable CMP, CBC with very mild thrombocytopenia, serially negative troponins.  Lactic acid was mildly elevated at 3.1 and flat at 3.3  on recheck though patient did not receive fluid resuscitation during this time.  UA and UDS were both unremarkable.  Covid test was negative.  EKG was unremarkable without ischemic changes and unchanged from prior.  Initial imaging was notable for chest x-ray that showed concern for pneumoperitoneum below the diaphragm, with no acute chest pathology.  To further evaluate these findings as well as the syncope is CT head without contrast and CT abdomen and pelvis with contrast were ordered.  While awaiting these results general surgery was consulted.  Ultimately CT abdomen and pelvis showed no evidence of pneumoperitoneum or perforated viscus and an indeterminate small lesion at the lower pole of the right kidney.  General surgery was reassured by these findings, however on their exam they noted an incidental sternal nodule for which they ordered an ultrasound.  CT head without contrast was notable for new changes that were concerning for areas of acute or subacute infarcts in the anterior right frontal lobe and high bilateral posterior frontal lobes.  Notably neuro exam per ED providers was normal throughout this time.  At this point she was admitted for further work-up of this unexpected imaging finding as well as her syncope.  Review of Systems:  Pertinent positives and negative per HPI, all others reviewed and negative  Past Medical History:  Diagnosis Date  . Depression   . Fainting spell   . Frequent headaches   . Hyperlipidemia   . Hypertension   . Migraine   . Stroke ([place])   . Thyroid disease   . UTI (  urinary tract infection)    Past Surgical History:  Procedure Laterality Date  . LOOP RECORDER INSERTION N/A 02/24/2018   Procedure: LOOP RECORDER INSERTION;  Surgeon: Deboraha Sprang, MD;  Location: [place] [facility];  Service: Cardiovascular;  Laterality: N/A;  . TEE WITHOUT CARDIOVERSION N/A 02/24/2018   Procedure: TRANSESOPHAGEAL ECHOCARDIOGRAM (TEE);  Surgeon: Minna Merritts, MD;  Location: [facility] ORS;  Service: Cardiovascular;  Laterality: N/A;   Social History:  reports that she has never smoked. She has never used smokeless tobacco. She reports previous alcohol use. She reports previous drug use.  No Known Allergies  Family History  Problem Relation Age of Onset  . Dementia Mother   . Hypertension Mother   . Hypothyroidism Mother   . Prostate cancer Father      Prior to Admission medications   Medication Sig Start Date End Date Taking? Authorizing Provider  aspirin EC 81 MG EC tablet Take 1 tablet (81 mg total) by mouth daily. 02/08/18  Yes Wieting, Richard, MD  Cholecalciferol (VITAMIN D3) 50 MCG (2000 UT) capsule Take 1 capsule (2,000 Units total) by mouth daily. 03/24/18  Yes Guse, Jacquelynn Cree, FNP  Multiple Vitamin (MULTIVITAMIN WITH MINERALS) TABS tablet Take 1 tablet by mouth daily.   Yes [provider]  atorvastatin (LIPITOR) 40 MG tablet Take 1 tablet (40 mg total) by mouth daily at 6 PM. Patient not taking: Reported on 07/15/2020 03/22/19   Leone Haven, MD   Physical Exam: Vitals:   07/15/20 1500 07/15/20 1600 07/15/20 1630 07/15/20 1700  BP: 138/74 132/72 132/66 (!) 165/93  Pulse: 77 75 78 84  Resp: 17 (!) 22 20 18   Temp:      SpO2: 100% 100% 100% 100%  Weight:      Height:        Wt Readings from Last 3 Encounters:  07/15/20 66 kg  03/23/18 66.8 kg  02/24/18 63 kg     . General:  Appears calm and comfortable . Eyes: PERRL, normal lids, irises & conjunctiva . ENT: grossly normal hearing, lips & tongue . Neck: no LAD, masses or thyromegaly . Cardiovascular: RRR, no m/r/g. No LE edema. . Telemetry: SR, no arrhythmias  . Respiratory: CTA bilaterally, no w/r/r. Normal respiratory effort. . Abdomen: soft, ntnd . Skin: Unusual skin ulceration on right sternal border . Musculoskeletal: grossly normal tone BUE/BLE . Psychiatric: grossly normal mood and affect, speech fluent and appropriate . Neurologic: grossly  non-focal.  Sensation intact to light touch and strength 5 out of 5 in bilateral upper and lower extremities.  Negative pronator drift.          Labs on Admission:  Basic Metabolic Panel: Recent Labs  Lab 07/15/20 1221  NA 142  K 3.8  CL 110  CO2 25  GLUCOSE 155*  BUN 19  CREATININE 1.08*  CALCIUM 8.7*   Liver Function Tests: Recent Labs  Lab 07/15/20 1221  AST 29  ALT 12  ALKPHOS 67  BILITOT 0.8  PROT 6.3*  ALBUMIN 3.1*   No results for input(s): LIPASE, AMYLASE in the last 168 hours. No results for input(s): AMMONIA in the last 168 hours. CBC: Recent Labs  Lab 07/15/20 1221  WBC 8.4  NEUTROABS 4.3  HGB 12.7  HCT 39.4  MCV 97.8  PLT 135*   Cardiac Enzymes: No results for input(s): CKTOTAL, CKMB, CKMBINDEX, TROPONINI in the last 168 hours.  BNP (last 3 results) No results for input(s): BNP in the last 8760  hours.  ProBNP (last 3 results) No results for input(s): PROBNP in the last 8760 hours.  CBG: Recent Labs  Lab 07/15/20 1227  GLUCAP 133*    Radiological Exams on Admission: CT Head Wo Contrast  Result Date: 07/15/2020 CLINICAL DATA:  Mental status change. EXAM: CT HEAD WITHOUT CONTRAST TECHNIQUE: Contiguous axial images were obtained from the base of the skull through the vertex without intravenous contrast. COMPARISON:  CT 02/05/2018. MRI 02/06/2018 FINDINGS: Brain: New area of hypoattenuation and loss of gray-white differentiation in the right frontal lobe. Faint linear hyperdensity along the superior/anterior aspect (series 2, image 21). Additional small focal areas of loss of gray-white differentiation in the high bilateral posterior frontal lobes (series 2, image 25 and series 4, image 49; series 2 image 26 and series 4 image 53) also concerning for acute or subacute infarcts. No substantial mass effect. Similar encephalomalacia in the left frontal lobe. Similar remote left cerebellar lacunar infarct. Small suspected arachnoid cyst overlying the  right parietal convexity posteriorly, similar to priors. Otherwise, no mass lesion. No midline shift. No mass occupying hemorrhage. Additional patchy white matter hypoattenuation, nonspecific but most likely related to chronic microvascular ischemic disease. Mild generalized cerebral volume loss with ex vacuo ventricular dilation. No hydrocephalus. Vascular: No hyperdense vessel identified. Skull: No acute fracture. Sinuses/Orbits: No acute findings. Other: No mastoid effusions. IMPRESSION: Multiple new areas of hypoattenuation and loss of gray-white differentiation in the anterior right frontal lobe and high bilateral posterior frontal lobes, concerning for acute or subacute infarcts. Faint linear hyperdensity anteriorly in the right frontal lobe may represent trace petechial hemorrhage. No substantial mass effect. Recommend MRI to further evaluate. Electronically Signed   By: Margaretha Sheffield MD   On: 07/15/2020 13:40   CT ABDOMEN PELVIS W CONTRAST  Result Date: 07/15/2020 CLINICAL DATA:  Altered mental status with syncopal episode and hypotension. Pneumoperitoneum on chest radiographs. EXAM: CT ABDOMEN AND PELVIS WITH CONTRAST TECHNIQUE: Multidetector CT imaging of the abdomen and pelvis was performed using the standard protocol following bolus administration of intravenous contrast. CONTRAST:  13mL OMNIPAQUE IOHEXOL 300 MG/ML  SOLN COMPARISON:  Chest radiographs same date. Abdominopelvic CT 11/08/2012. FINDINGS: Lower chest: Clear lung bases. No significant pleural or pericardial effusion. No basilar pneumothorax. Mild aortic atherosclerosis. Hepatobiliary: The liver is normal in density without suspicious focal abnormality. No evidence of gallstones, gallbladder wall thickening or biliary dilatation. Pancreas: Unremarkable. No pancreatic ductal dilatation or surrounding inflammatory changes. Spleen: Normal in size without focal abnormality. Adrenals/Urinary Tract: Both adrenal glands appear normal. There  is a small nonobstructing calculus in the lower pole of the left kidney. There are small bilateral renal cysts which have mildly enlarged compared with the previous study. Renal sinus cysts are present bilaterally without hydronephrosis or delayed contrast excretion. In the lower pole of the right kidney, there is an ill-defined low-density lesion measuring 1.6 x 1.5 cm on image 37/2. This is an indeterminate finding which could reflect a complex cyst, small mass or focal inflammation. The bladder is decompressed by a Foley catheter. Stomach/Bowel: There is a large amount of ingested material within the stomach. There is moderate stool throughout the colon. No bowel wall thickening, significant distention or surrounding inflammation. The appendix appears normal. Vascular/Lymphatic: There are no enlarged abdominal or pelvic lymph nodes. No acute vascular findings. Mild aortic and branch vessel atherosclerosis. The portal, superior mesenteric and splenic veins are patent. Reproductive: Stable globular appearance of the uterus suspicious for small fibroids. No adnexal mass. Other: There is no free  intraperitoneal air. Suggested pneumoperitoneum on prior chest radiographs is attributed to gas in the colon and stomach. There is colonic interposition between the liver and right hemidiaphragm. No ascites or focal extraluminal fluid collection. Musculoskeletal: No acute or significant osseous findings. There are degenerative changes throughout the lumbar spine. There is a chronic Schmorl's node involving the inferior endplate of L1 which appears grossly stable. IMPRESSION: 1. No evidence of pneumoperitoneum or perforated abdominal viscus. The suggested abnormality on prior chest radiograph is due to luminal gas in the stomach and colon. 2. Indeterminate small lesion involving the lower pole of the right kidney with differential considerations including focal inflammation, complex cyst and solid mass. Correlation with urine  analysis recommended. Recommend follow-up dedicated renal MRI or CT with and without contrast. 3. No other acute findings. Prominent ingested material in the stomach and prominent stool throughout the colon. 4. Nonobstructing left renal calculus. 5. Mild Aortic Atherosclerosis (ICD10-I70.0). Electronically Signed   By: Richardean Sale M.D.   On: 07/15/2020 14:33   DG Chest Portable 1 View  Result Date: 07/15/2020 CLINICAL DATA:  Altered mental status and syncopal episode. Hypotension. EXAM: PORTABLE CHEST 1 VIEW COMPARISON:  None. FINDINGS: The heart size and mediastinal contours are normal aside from mild aortic tortuosity. The lungs appear clear. There is no pleural effusion or pneumothorax. Air is noted beneath the diaphragm bilaterally consistent with pneumoperitoneum. There is a probable old fracture of the left 5th rib anteriorly. No acute osseous findings are evident. Telemetry leads and a loop recorder overlie the chest. IMPRESSION: 1. Pneumoperitoneum implying a ruptured abdominal viscus in the absence of recent surgery or penetrating injury. Surgical consultation recommended. Abdominopelvic CT may be helpful for further evaluation. 2. No acute chest findings. 3. Critical Value/emergent results were called by telephone at the time of interpretation on 07/15/2020 at 12:43 pm to provider [facility] , who verbally acknowledged these results. Electronically Signed   By: Richardean Sale M.D.   On: 07/15/2020 12:45    EKG: Independently reviewed.  Normal sinus rhythm, unremarkable without any signs of ischemia.  Assessment/Plan Active Problems:   CVA (cerebral vascular accident) [facility])   Essential hypertension   Hypothyroidism   Hyperlipidemia   Syncope  Kaylee [name] is a 80 y.o. female with history of CVA in 2019, hypertension, hyperlipidemia, hypothyroidism, who presents after an episode of syncope from home.  #Syncope Patient's report of symptoms most consistent with vagal etiology  given prodromal symptoms before and report of rapid resolution and sweating afterwards.  Will monitor on telemetry and may be worth interrogating her loop recorder to see if there were any events, but overall suspicion is low for other etiologies such as seizure or cardiac arrhythmia. -Monitor on telemetry  #Abnormal CT head findings #History of CVA CT findings concerning for possible new areas of ischemia.  Patient's neuro exam is benign with no focal deficits.  Will obtain MRI and consult neurology.  Patient has [employer] Reveal LINQ loop recorder implanted since November 2019 which was palpated on exam.  It is MRI conditionally compatible. -Follow-up results of MRI -Follow-up neurology recommendations in a.m. -Continue daily aspirin -Patient not compliant with statin, will not order for now pending results of MRI  #Lactic acidosis Unclear etiology, slightly uptrending but has not been fluid resuscitated.  Will bolus another liter of fluid and run fluids overnight and reassess.  #Kidney lesion Incidental finding, follow-up as an outpatient.  #Sternal lesion Incidental finding on exam, when patient asked about it states that she  thinks she might of fallen and hit that area in the past.  Appreciate general surgery recommendations, will obtain an ultrasound for initial work-up.  Code Status: Full code DVT Prophylaxis: Lovenox Family Communication: Sister updated at bedside Disposition Plan: Inpatient, MedSurg  Time spent: 62 min  Clarnce Flock MD/MPH [facility]  Note:  This document was prepared using Systems analyst and may include unintentional dictation errors.

## 2020-07-15 NOTE — ED Notes (Signed)
Floor called as handoff tool only has first name of nurse. Spoke with Angie who states has to talk to charge to look over patient and will call me back.

## 2020-07-15 NOTE — ED Notes (Signed)
Informed RN bed assigned [id number]

## 2020-07-15 NOTE — ED Triage Notes (Signed)
Pt at home eating lunch. Had episode of altered mental status followed by syncopal episode. When [employer] department arrived SBP in 60s. On arrival of ems pt SBP in 90's with continued altered mental status. Pt given 450 bolus iv fluids.

## 2020-07-15 NOTE — ED Notes (Signed)
[name]. Made aware of critical Lactic acid 3.1

## 2020-07-16 ENCOUNTER — Inpatient Hospital Stay: Payer: Medicare Other

## 2020-07-16 ENCOUNTER — Encounter: Payer: Self-pay | Admitting: Family Medicine

## 2020-07-16 DIAGNOSIS — R55 Syncope and collapse: Secondary | ICD-10-CM

## 2020-07-16 DIAGNOSIS — E039 Hypothyroidism, unspecified: Secondary | ICD-10-CM | POA: Diagnosis not present

## 2020-07-16 DIAGNOSIS — I639 Cerebral infarction, unspecified: Secondary | ICD-10-CM | POA: Diagnosis not present

## 2020-07-16 DIAGNOSIS — I10 Essential (primary) hypertension: Secondary | ICD-10-CM | POA: Diagnosis not present

## 2020-07-16 LAB — HEMOGLOBIN A1C
Hgb A1c MFr Bld: 5.6 % (ref 4.8–5.6)
Mean Plasma Glucose: 114.02 mg/dL

## 2020-07-16 LAB — LIPID PANEL
Cholesterol: 174 mg/dL (ref 0–200)
HDL: 69 mg/dL (ref >40)
LDL Cholesterol: 99 mg/dL (ref 0–99)
Total CHOL/HDL Ratio: 2.5 RATIO
Triglycerides: 29 mg/dL (ref <150)
VLDL: 6 mg/dL (ref 0–40)

## 2020-07-16 LAB — LACTIC ACID, PLASMA: Lactic Acid, Venous: 3 mmol/L (ref 0.5–1.9)

## 2020-07-16 MED ORDER — IOHEXOL 350 MG/ML SOLN
75.0000 mL | Freq: Once | INTRAVENOUS | Status: AC | PRN
  Administered 2020-07-16: 75 mL via INTRAVENOUS

## 2020-07-16 MED ORDER — CLOPIDOGREL BISULFATE 75 MG PO TABS: 75.0000 mg | ORAL_TABLET | Freq: Every day | ORAL | Status: DC

## 2020-07-16 NOTE — TOC Initial Note (Signed)
Transition of Care [facility]) - Initial/Assessment Note    Patient Details  Name: Kaylee [name] MRN: [id number] Date of Birth: 1941/02/21  Transition of Care [facility]) CM/SW Contact:    Kaylee Masson, RN Phone Number: [phone] 07/16/2020, 4:19 PM  Clinical Narrative:                 Recommended PT pending OT for [facility]. RN spoke with pt today and confirmed pt has a support system lives in a house with her sister Kaylee [name]). Pt has transportation source and able to obtain her medications. Pt does not have a preference for [place] agency. Spoke with Kaylee [name] at [place] who will accommodate PT/OT services upon pt's discharge.   TOC will continue to follow up with discharge needs.  Expected Discharge Plan: [place] Barriers to Discharge: Continued Medical Work up   Patient Goals and CMS Choice     Choice offered to / list presented to : Patient  Expected Discharge Plan and Services Expected Discharge Plan: [place]   Discharge Planning Services: CM Consult   Living arrangements for the past 2 months: [place]: PT,OT [place] Agency: [place] ([place]) Date [place]: 07/16/20 Time [place]: [place] Representative spoke with at [place]: Kaylee [name]  Prior Living Arrangements/Services Living arrangements for the past 2 months: [place] with:: Siblings Kaylee [name]) Patient language and need for interpreter reviewed:: Yes Do you feel safe going back to the place where you live?: Yes      Need for Family Participation in Patient Care: Yes (Comment) Care giver support system in place?: Yes (comment)   Criminal Activity/Legal Involvement Pertinent to Current Situation/Hospitalization: No - Comment as needed  Activities of Daily Living Home Assistive Devices/Equipment: Cane (specify quad or straight) ADL Screening (condition at time of  admission) Patient's cognitive ability adequate to safely complete daily activities?: Yes Is the patient deaf or have difficulty hearing?: No Does the patient have difficulty seeing, even when wearing glasses/contacts?: No Does the patient have difficulty concentrating, remembering, or making decisions?: No Patient able to express need for assistance with ADLs?: Yes Does the patient have difficulty dressing or bathing?: No Independently performs ADLs?: Yes (appropriate for developmental age) Does the patient have difficulty walking or climbing stairs?: Yes Weakness of Legs: None Weakness of Arms/Hands: None  Permission Sought/Granted Permission sought to share information with : Case Manager Permission granted to share information with : Yes, Verbal Permission Granted              Emotional Assessment Appearance:: Appears stated age Attitude/Demeanor/Rapport: Engaged Affect (typically observed): Accepting Orientation: : Oriented to Self,Oriented to Place,Oriented to  Time,Oriented to Situation Alcohol / Substance Use: Not Applicable Psych Involvement: No (comment)  Admission diagnosis:  Syncope [R55] NSTEMI (non-ST elevated myocardial infarction) ([place]) [I21.4] Sternal mass [M89.8X8] Cerebrovascular accident (CVA), unspecified mechanism ([place]) [I63.9] Patient Active Problem List   Diagnosis Date Noted  . Syncope 07/15/2020  . NSTEMI (non-ST elevated myocardial infarction) ([place]) 07/15/2020  . Essential hypertension 02/10/2018  . Hypothyroidism 02/10/2018  . Hyperlipidemia 02/10/2018  . CVA (cerebral vascular accident) ([place]) 02/05/2018   PCP:  Kathyrn Lass Pharmacy:   [facility] [address] [place] [address] Phone: [phone] Fax: [phone]  Social Determinants of Health (SDOH) Interventions    Readmission Risk Interventions No flowsheet data found.

## 2020-07-16 NOTE — Evaluation (Signed)
Physical Therapy Evaluation Patient Details Name: Kaylee [name] MRN: [id number] DOB: 12-06-1940 Today's Date: 07/16/2020   History of Present Illness  Kaylee [name] is an 23yoF who comes to [facility] on 4/2after acute onset AMS and syncope. Pt hypotensive with EMS adn in ED. PMH: syncope, HLD, HTN, migraine HA, CVA, UTI, hypoTSH. 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 Pt has (+) orthostatic hypotension c NSG on 4/3.  Clinical Impression  Pt admitted with above diagnosis. Pt currently with functional limitations due to the deficits listed below (see "PT Problem List"). Upon entry, pt at [facility] waiting for staff to assist her to BR; Son in room. Son provides updates to pt's recent level of function at home. Pt assisted to BR, no physical assist needed, just claering a path, moving equipment in room, assistance with leads. Pt able to stand at sink and wash hands without LOB or unsteadiness. Pt orthostatic in AM, hence extensive AMB not pursued at this time. Strength and balance are both very near baseline per patient and per pt's son. Patient's performance this date reveals decreased independence and tolerance in performing basic mobility required for performance of activities of daily living, largely 2/2 BP instability when upright. Pt requires additional DME, close physical assistance, and cues for safe participate in mobility. Pt will benefit from skilled PT intervention to increase independence and safety with basic mobility in preparation for discharge to the venue listed below.       Follow Up Recommendations Home health PT    Equipment Recommendations  None recommended by PT    Recommendations for Other Services       Precautions / Restrictions Precautions Precautions: Fall Restrictions Weight Bearing Restrictions: No      Mobility  Bed Mobility Overal bed mobility: Needs Assistance Bed Mobility: Sit to  Supine       Sit to supine: Supervision   General bed mobility comments: labored, but no assist needed    Transfers Overall transfer level: Needs assistance Equipment used: Rolling walker (2 wheeled) Transfers: Sit to/from Stand Sit to Stand: Modified independent (Device/Increase time)            Ambulation/Gait Ambulation/Gait assistance: Min guard;Supervision Gait Distance (Feet): 10 Feet (bed to toilet, then back to bed) Assistive device: Rolling walker (2 wheeled) Gait Pattern/deviations: WFL(Within Functional Limits)     General Gait Details: no extensive AMB due to recent orthostasis, then MD in room (session ended).  Stairs            Wheelchair Mobility    Modified Rankin (Stroke Patients Only)       Balance Overall balance assessment: Modified Independent                                           Pertinent Vitals/Pain Pain Assessment: No/denies pain    Home Living Family/patient expects to be discharged to:: Private residence Living Arrangements: Children;Other relatives (Lives with her sister) Available Help at Discharge: Family Type of Home: House Home Access: Stairs to enter Entrance Stairs-Rails: Can reach both Entrance Stairs-Number of Steps: 4 Home Layout: One level Home Equipment: Cane - single point;Shower seat      Prior Function Level of Independence: Needs assistance               Hand Dominance  Extremity/Trunk Assessment   Upper Extremity Assessment Upper Extremity Assessment: Overall WFL for tasks assessed;Generalized weakness    Lower Extremity Assessment Lower Extremity Assessment: Generalized weakness;Overall WFL for tasks assessed       Communication      Cognition Arousal/Alertness: Awake/alert Behavior During Therapy: WFL for tasks assessed/performed Overall Cognitive Status: Within Functional Limits for tasks assessed                                         General Comments      Exercises     Assessment/Plan    PT Assessment Patient needs continued PT services  PT Problem List Decreased activity tolerance;Decreased mobility       PT Treatment Interventions Gait training;DME instruction;Balance training;Stair training;Functional mobility training;Therapeutic activities;Therapeutic exercise;Patient/family education    PT Goals (Current goals can be found in the Care Plan section)  Acute Rehab PT Goals Patient Stated Goal: return to baseline mobiilty, avoid furture syncopal episodes PT Goal Formulation: With patient Time For Goal Achievement: 07/30/20 Potential to Achieve Goals: Fair    Frequency Min 2X/week   Barriers to discharge        Co-evaluation               AM-PAC PT "6 Clicks" Mobility  Outcome Measure Help needed turning from your back to your side while in a flat bed without using bedrails?: None Help needed moving from lying on your back to sitting on the side of a flat bed without using bedrails?: None Help needed moving to and from a bed to a chair (including a wheelchair)?: None Help needed standing up from a chair using your arms (e.g., wheelchair or bedside chair)?: None Help needed to walk in hospital room?: A Little Help needed climbing 3-5 steps with a railing? : A Little 6 Click Score: 22    End of Session Equipment Utilized During Treatment: Gait belt Activity Tolerance: Patient tolerated treatment well;No increased pain Patient left: in bed;with family/visitor present;with nursing/sitter in room;with call bell/phone within reach;with bed alarm set Nurse Communication: Mobility status PT Visit Diagnosis: Other symptoms and signs involving the nervous system (F42.395)    Time: 3202-3343 PT Time Calculation (min) (ACUTE ONLY): 10 min   Charges:   PT Evaluation $PT Eval Low Complexity: 1 Low          1:02 PM, 07/16/20 Etta Grandchild, PT, DPT Physical Therapist - [facility]  [phone] ([place])    [place] [facility] 07/16/2020, 12:52 PM

## 2020-07-16 NOTE — Progress Notes (Addendum)
[facility]  PROGRESS NOTE  Kaylee [name] PZW:[id number] DOB: [birth year] DOA: 07/15/2020 PCP: Pcp, No   Brief HPI:   80 year old female with a history of CVA in 2019, hypertension, hyperlipidemia, hypothyroidism who presented with syncope from home.  Patient was having brunch with her brothers and one of her sisters when she suddenly became unresponsive and had syncopal episode.  There was no shaking activity noted, no loss of bladder or bowel control.  She was found to be hypotensive when EMS arrived, her systolic blood pressure was in the 90s. Initial imaging with chest x-ray showed concern for pneumoperitoneum below the diaphragm with no acute chest pathology.  CT abdomen/pelvis was done which did not show pneumoperitoneum or perforated viscus.  She also was found to have a sternal lesion and ultrasound was ordered as per general surgery recommendation. CT head without contrast showed changes concerning for acute or subacute infarcts in the anterior right frontal lobe and high bilateral posterior frontal lobes.  Neuro exam was normal.  MRI brain was ordered.    Subjective   Patient seen and examined, denies any complaints.   Assessment/Plan:     1. Syncope-likely vasovagal, she had prodromal symptoms before with rapid resolution of syncope.  Continue telemetry monitoring.  She has positive orthostatic hypotension.  We will continue to monitor patient's orthostatic vital signs every shift.Will obtain echocardiogram. 2. History of CVA-CT head was concerning for new areas of ischemia acute versus subacute infarct.  MRI brain obtained this morning shows no acute intracranial pathology.  Neurology has been consulted to see patient.  Will await their recommendations.  3. Lactic acidosis-likely from hypotension.  Lactic acid is 3.0.  No signs of sepsis.  No infectious process suspected. 4. 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  General  surgery following. 5. Right kidney lesion-indeterminate small lesion involving the lower pole of the right kidney was noted with differential including focal inflammation, complex cyst versus solid mass on CT abdomen pelvis obtained yesterday.  Recommend to obtain reading MRI or CT with and without contrast as outpatient.    Scheduled medications:   . aspirin EC  81 mg Oral Daily  . cholecalciferol  2,000 Units Oral Daily  . enoxaparin (LOVENOX) injection  40 mg Subcutaneous Q24H  . multivitamin with minerals  1 tablet Oral Daily         Data Reviewed:   CBG:  Recent Labs  Lab 07/15/20 1227  GLUCAP 133*    SpO2: 100 %    Vitals:   07/16/20 0149 07/16/20 0347 07/16/20 0549 07/16/20 0823  BP: (!) 163/85 (!) 174/91 (!) 163/79 (!) 155/74  Pulse: 63 66 70 70  Resp: 17 15 16 15   Temp: 98.5 F (36.9 C) 98.2 F (36.8 C) 98.6 F (37 C) 98.1 F (36.7 C)  TempSrc: Oral Oral  Oral  SpO2: 100% 100% 100% 100%  Weight:  75.4 kg    Height:         Intake/Output Summary (Last 24 hours) at 07/16/2020 1130 Last data filed at 07/16/2020 1029 Gross per 24 hour  Intake 1115.21 ml  Output --  Net 1115.21 ml    04/01 1901 - 04/03 0700 In: 1115.2 [P.O.:100; I.V.:249.3] Out: -   Filed Weights   07/15/20 1211 07/16/20 0347  Weight: 66 kg 75.4 kg    CBC:  Recent Labs  Lab 07/15/20 1221  WBC 8.4  HGB 12.7  HCT 39.4  PLT 135*  MCV 97.8  MCH 31.5  MCHC 32.2  RDW 12.9  LYMPHSABS 3.3  MONOABS 0.5  EOSABS 0.3  BASOSABS 0.1    Complete metabolic panel:  Recent Labs  Lab 07/15/20 1221 07/15/20 1456 07/15/20 2236 07/16/20 0030  NA 142  --   --   --   K 3.8  --   --   --   CL 110  --   --   --   CO2 25  --   --   --   GLUCOSE 155*  --   --   --   BUN 19  --   --   --   CREATININE 1.08*  --   --   --   CALCIUM 8.7*  --   --   --   AST 29  --   --   --   ALT 12  --   --   --   ALKPHOS 67  --   --   --   BILITOT 0.8  --   --   --   ALBUMIN 3.1*  --   --   --    LATICACIDVEN 3.1* 3.3* 3.8* 3.0*    No results for input(s): LIPASE, AMYLASE in the last 168 hours.  Recent Labs  Lab 07/15/20 1221  SARSCOV2NAA NEGATIVE    ------------------------------------------------------------------------------------------------------------------ Recent Labs    07/16/20 0030  CHOL 174  HDL 69  LDLCALC 99  TRIG 29  CHOLHDL 2.5    Lab Results  Component Value Date   HGBA1C 5.2 02/06/2018   ------------------------------------------------------------------------------------------------------------------ No results for input(s): TSH, T4TOTAL, T3FREE, THYROIDAB in the last 72 hours.  Invalid input(s): FREET3 ------------------------------------------------------------------------------------------------------------------ No results for input(s): VITAMINB12, FOLATE, FERRITIN, TIBC, IRON, RETICCTPCT in the last 72 hours.  Coagulation profile  No results for input(s): INR, PROTIME in the last 168 hours.  No results for input(s): DDIMER in the last 72 hours.  Cardiac Enzymes  No results for input(s): CKMB, TROPONINI, MYOGLOBIN in the last 168 hours.  Invalid input(s): CK ------------------------------------------------------------------------------------------------------------------ No results found for: BNP   Antibiotics: Anti-infectives (From admission, onward)   Start     Dose/Rate Route Frequency Ordered Stop   07/15/20 1315  piperacillin-tazobactam (ZOSYN) IVPB 3.375 g        3.375 g 100 mL/hr over 30 Minutes Intravenous  Once 07/15/20 1303 07/15/20 1436       Radiology Reports  CT Head Wo Contrast  Result Date: 07/15/2020 CLINICAL DATA:  Mental status change. EXAM: CT HEAD WITHOUT CONTRAST TECHNIQUE: Contiguous axial images were obtained from the base of the skull through the vertex without intravenous contrast. COMPARISON:  CT 02/05/2018. MRI 02/06/2018 FINDINGS: Brain: New area of hypoattenuation and loss of gray-white  differentiation in the right frontal lobe. Faint linear hyperdensity along the superior/anterior aspect (series 2, image 21). Additional small focal areas of loss of gray-white differentiation in the high bilateral posterior frontal lobes (series 2, image 25 and series 4, image 49; series 2 image 26 and series 4 image 53) also concerning for acute or subacute infarcts. No substantial mass effect. Similar encephalomalacia in the left frontal lobe. Similar remote left cerebellar lacunar infarct. 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 Otherwise, no mass lesion. No midline shift. No mass occupying hemorrhage. Additional patchy white matter hypoattenuation, nonspecific but most likely related to chronic microvascular ischemic disease. Mild generalized cerebral volume loss with ex vacuo ventricular dilation. No hydrocephalus. Vascular: No hyperdense vessel identified. Skull: No  acute fracture. Sinuses/Orbits: No acute findings. Other: No mastoid effusions. IMPRESSION: Multiple new areas of hypoattenuation and loss of gray-white differentiation in the anterior right frontal lobe and high bilateral posterior frontal lobes, concerning for acute or subacute infarcts. Faint linear hyperdensity anteriorly in the right frontal lobe may represent trace petechial hemorrhage. No substantial mass effect. Recommend MRI to further evaluate. Electronically Signed   By: Margaretha Sheffield MD   On: 07/15/2020 13:40   MR BRAIN WO CONTRAST  Result Date: 07/16/2020 CLINICAL DATA:  80 year old female with unexplained altered mental status. Plain head CT suspicious for acute to subacute frontal lobe infarcts. EXAM: MRI HEAD WITHOUT CONTRAST TECHNIQUE: Multiplanar, multiecho pulse sequences of the brain and surrounding structures were obtained without intravenous contrast. COMPARISON:  Head CT yesterday.  Brain MRI 02/06/2018. FINDINGS: Brain: No restricted diffusion to suggest  acute infarction. No midline shift, mass effect, evidence of mass lesion, ventriculomegaly, extra-axial collection or acute intracranial hemorrhage. Cervicomedullary junction and pituitary are within normal limits. Multifocal chronic bilateral MCA territory cortical and subcortical white matter encephalomalacia corresponds to the hypodense areas seen on CT yesterday, and most of these are new from the 2017/07/24 exams. Associated hemosiderin in both hemispheres, also new since 07/24/17 and most pronounced in the left parietal lobe (series 13, image 40 on SWI today). Scattered additional cerebral white matter T2 and FLAIR hyperintensity has also progressed. The deep gray matter nuclei and brainstem remain within normal limits. Small chronic cerebellar infarcts are stable since 2017-07-24 and more pronounced on the left. Vascular: Major intracranial vascular flow voids are stable since 07-24-2017 with intracranial artery tortuosity. Skull and upper cervical spine: Degenerative ligamentous hypertrophy about the odontoid is chronic. Visualized bone marrow signal is within normal limits. Sinuses/Orbits: Negative orbits. Mild paranasal sinus mucosal thickening is new since 2017-07-24. No sinus fluid levels. Other: Mastoids remain clear. Negative visible orbit and scalp soft tissues. IMPRESSION: 1. No acute intracranial abnormality. 2. 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 Electronically Signed   By: Genevie Ann M.D.   On: 07/16/2020 11:10   CT ABDOMEN PELVIS W CONTRAST  Result Date: 07/15/2020 CLINICAL DATA:  Altered mental status with syncopal episode and hypotension. Pneumoperitoneum on chest radiographs. EXAM: CT ABDOMEN AND PELVIS WITH CONTRAST TECHNIQUE: Multidetector CT imaging of the abdomen and pelvis was performed using the standard protocol following bolus administration of intravenous  contrast. CONTRAST:  176mL OMNIPAQUE IOHEXOL 300 MG/ML  SOLN COMPARISON:  Chest radiographs same date. Abdominopelvic CT 11/08/2012. FINDINGS: Lower chest: Clear lung bases. No significant pleural or pericardial effusion. No basilar pneumothorax. Mild aortic atherosclerosis. Hepatobiliary: The liver is normal in density without suspicious focal abnormality. No evidence of gallstones, gallbladder wall thickening or biliary dilatation. Pancreas: Unremarkable. No pancreatic ductal dilatation or surrounding inflammatory changes. Spleen: Normal in size without focal abnormality. Adrenals/Urinary Tract: Both adrenal glands appear normal. There is a small nonobstructing calculus in the lower pole of the left kidney. There are small bilateral renal cysts which have mildly enlarged compared with the previous study. Renal sinus cysts are present bilaterally without hydronephrosis or delayed contrast excretion. In the lower pole of the right kidney, there is an ill-defined low-density lesion measuring 1.6 x 1.5 cm on image 37/2. This is an indeterminate finding which could reflect a complex cyst, small mass or focal inflammation. The bladder is decompressed by a Foley catheter. Stomach/Bowel: There is a large amount of ingested  material within the stomach. There is moderate stool throughout the colon. No bowel wall thickening, significant distention or surrounding inflammation. The appendix appears normal. Vascular/Lymphatic: There are no enlarged abdominal or pelvic lymph nodes. No acute vascular findings. Mild aortic and branch vessel atherosclerosis. The portal, superior mesenteric and splenic veins are patent. Reproductive: Stable globular appearance of the uterus suspicious for small fibroids. No adnexal mass. Other: There is no free intraperitoneal air. Suggested pneumoperitoneum on prior chest radiographs is attributed to gas in the colon and stomach. There is colonic interposition between the liver and right  hemidiaphragm. No ascites or focal extraluminal fluid collection. Musculoskeletal: No acute or significant osseous findings. There are degenerative changes throughout the lumbar spine. There is a chronic Schmorl's node involving the inferior endplate of L1 which appears grossly stable. IMPRESSION: 1. No evidence of pneumoperitoneum or perforated abdominal viscus. The suggested abnormality on prior chest radiograph is due to luminal gas in the stomach and colon. 2. Indeterminate small lesion involving the lower pole of the right kidney with differential considerations including focal inflammation, complex cyst and solid mass. Correlation with urine analysis recommended. Recommend follow-up dedicated renal MRI or CT with and without contrast. 3. No other acute findings. Prominent ingested material in the stomach and prominent stool throughout the colon. 4. Nonobstructing left renal calculus. 5. Mild Aortic Atherosclerosis (ICD10-I70.0). Electronically Signed   By: Richardean Sale M.D.   On: 07/15/2020 14:33   DG Chest Portable 1 View  Result Date: 07/15/2020 CLINICAL DATA:  Altered mental status and syncopal episode. Hypotension. EXAM: PORTABLE CHEST 1 VIEW COMPARISON:  None. FINDINGS: The heart size and mediastinal contours are normal aside from mild aortic tortuosity. The lungs appear clear. There is no pleural effusion or pneumothorax. Air is noted beneath the diaphragm bilaterally consistent with pneumoperitoneum. There is a probable old fracture of the left 5th rib anteriorly. No acute osseous findings are evident. Telemetry leads and a loop recorder overlie the chest. IMPRESSION: 1. Pneumoperitoneum implying a ruptured abdominal viscus in the absence of recent surgery or penetrating injury. Surgical consultation recommended. Abdominopelvic CT may be helpful for further evaluation. 2. No acute chest findings. 3. Critical Value/emergent results were called by telephone at the time of interpretation on 07/15/2020  at 12:43 pm to provider [facility] , who verbally acknowledged these results. Electronically Signed   By: Richardean Sale M.D.   On: 07/15/2020 12:45   [place] CHEST SOFT TISSUE  Result Date: 07/15/2020 CLINICAL DATA:  Midline open sternal wound to the chest. Started after a fall and progressed over a year. EXAM: ULTRASOUND OF CHEST SOFT TISSUES TECHNIQUE: Ultrasound examination of the chest wall soft tissues was performed in the area of clinical concern. COMPARISON:  None. FINDINGS: Ultrasound images of the sternal wound demonstrate a circumscribed heterogeneous hypoechoic region extending from the skin surface to the anterior sternum and measuring 3 x 3 x 2.6 cm. Patchy flow is demonstrated within the lesion on color flow Doppler imaging. Appearance suggest a mass lesion. Given the chronic nature of the lesion, it likely represents a infiltrating soft tissue mass. Alternatively, chronic osteomyelitis with abscess and cellulitis would be a possibility. IMPRESSION: Circumscribed heterogeneous hypoechoic mass in the sternal wound may represent an infiltrating soft tissue mass, or less likely chronic osteomyelitis with abscess and cellulitis. Electronically Signed   By: Lucienne Capers M.D.   On: 07/15/2020 22:11      DVT prophylaxis: Lovenox  Code Status: Full code  Family Communication: No family at bedside  Consultants:    Procedures:      Objective    Physical Examination:  General-appears in no acute distress Heart-S1-S2, regular, no murmur auscultated Chest-3 x 3 cm fungating mass noted on the sternum Lungs-clear to auscultation bilaterally, no wheezing or crackles auscultated Abdomen-soft, nontender, no organomegaly Extremities-no edema in the lower extremities Neuro-alert, oriented x3, no focal deficit noted   Status is: Inpatient  Dispo: The patient is from: Home              Anticipated d/c is to: To be decided              Anticipated d/c date is: 07/18/2020               Patient currently not stable for discharge  Barrier to discharge-ongoing evaluation for syncope  COVID-19 Labs  No results for input(s): DDIMER, FERRITIN, LDH, CRP in the last 72 hours.  Lab Results  Component Value Date   [place] NEGATIVE 07/15/2020    Microbiology  Recent Results (from the past 240 hour(s))  Resp Panel by RT-PCR (Flu A&B, Covid) Nasopharyngeal Swab     Status: None   Collection Time: 07/15/20 12:21 PM   Specimen: Nasopharyngeal Swab; Nasopharyngeal(NP) swabs in vial transport medium  Result Value Ref Range Status   SARS Coronavirus 2 by RT PCR NEGATIVE NEGATIVE Final    Comment: (NOTE) SARS-CoV-2 target nucleic acids are NOT DETECTED.  The SARS-CoV-2 RNA is generally detectable in upper respiratory specimens during the acute phase of infection. The lowest concentration of SARS-CoV-2 viral copies this assay can detect is 138 copies/mL. A negative result does not preclude SARS-Cov-2 infection and should not be used as the sole basis for treatment or other patient management decisions. A negative result may occur with  improper specimen collection/handling, submission of specimen other than nasopharyngeal swab, presence of viral mutation(s) within the areas targeted by this assay, and inadequate number of viral copies(<138 copies/mL). A negative result must be combined with clinical observations, patient history, and epidemiological information. The expected result is Negative.  Fact Sheet for Patients:  EntrepreneurPulse.com.au  Fact Sheet for Healthcare Providers:  IncredibleEmployment.be  This test is no t yet approved or cleared by the [place] FDA and  has been authorized for detection and/or diagnosis of SARS-CoV-2 by FDA under an Emergency Use Authorization (EUA). This EUA will remain  in effect (meaning this test can be used) for the duration of the COVID-19 declaration under Section 564(b)(1)  of the Act, 21 U.S.C.section 360bbb-3(b)(1), unless the authorization is terminated  or revoked sooner.       Influenza A by PCR NEGATIVE NEGATIVE Final   Influenza B by PCR NEGATIVE NEGATIVE Final    Comment: (NOTE) The Xpert Xpress SARS-CoV-2/FLU/RSV plus assay is intended as an aid in the diagnosis of influenza from Nasopharyngeal swab specimens and should not be used as a sole basis for treatment. Nasal washings and aspirates are unacceptable for Xpert Xpress SARS-CoV-2/FLU/RSV testing.  Fact Sheet for Patients: EntrepreneurPulse.com.au  Fact Sheet for Healthcare Providers: IncredibleEmployment.be  This test is not yet approved or cleared by the [place] FDA and has been authorized for detection and/or diagnosis of SARS-CoV-2 by FDA under an Emergency Use Authorization (EUA). This EUA will remain in effect (meaning this test can be used) for the duration of the COVID-19 declaration under Section 564(b)(1) of the Act, 21 U.S.C. section 360bbb-3(b)(1), unless the authorization is terminated or revoked.  Performed at [facility], [address] [place], [place] [address]           Oswald Hillock   [facility] If 7PM-7AM, please contact night-coverage at [email or web address], Office  [phone]   07/16/2020, 11:30 AM  LOS: 1 day

## 2020-07-16 NOTE — Plan of Care (Signed)
Shift Summary: Pt admitted from ED overnight, in no acute distress. Oriented x4, on RA, BP elevated but stable. No c/o pain/SOB. Q2hr vitals maintained. NSR on telemetry. Chest u/s completed overnight, MRI pending today; pt aware. LR infusing per MAR, subsequent downtrend in lactate overnight. Adequate UO this shift, x1 BM overnight. Fall/safety precautions maintained, rounding performed, needs/concerns addressed. Continuing with POC.  Problem: Education: Goal: Knowledge of General Education information will improve Description: Including pain rating scale, medication(s)/side effects and non-pharmacologic comfort measures 07/16/2020 0509 by Jocelyn Lamer, RN Outcome: Progressing 07/15/2020 2148 by Jocelyn Lamer, RN Outcome: Progressing   Problem: Health Behavior/Discharge Planning: Goal: Ability to manage health-related needs will improve 07/16/2020 0509 by Jocelyn Lamer, RN Outcome: Progressing 07/15/2020 2148 by Jocelyn Lamer, RN Outcome: Progressing   Problem: Clinical Measurements: Goal: Ability to maintain clinical measurements within normal limits will improve 07/16/2020 0509 by Jocelyn Lamer, RN Outcome: Progressing 07/15/2020 2148 by Jocelyn Lamer, RN Outcome: Progressing Goal: Will remain free from infection 07/16/2020 0509 by Jocelyn Lamer, RN Outcome: Progressing 07/15/2020 2148 by Jocelyn Lamer, RN Outcome: Progressing Goal: Diagnostic test results will improve 07/16/2020 0509 by Jocelyn Lamer, RN Outcome: Progressing 07/15/2020 2148 by Jocelyn Lamer, RN Outcome: Progressing Goal: Respiratory complications will improve 07/16/2020 0509 by Jocelyn Lamer, RN Outcome: Progressing 07/15/2020 2148 by Jocelyn Lamer, RN Outcome: Progressing Goal: Cardiovascular complication will be avoided 07/16/2020 0509 by Jocelyn Lamer, RN Outcome: Progressing 07/15/2020 2148 by Jocelyn Lamer, RN Outcome: Progressing   Problem: Activity: Goal: Risk for activity intolerance will  decrease 07/16/2020 0509 by Jocelyn Lamer, RN Outcome: Progressing 07/15/2020 2148 by Jocelyn Lamer, RN Outcome: Progressing   Problem: Nutrition: Goal: Adequate nutrition will be maintained 07/16/2020 0509 by Jocelyn Lamer, RN Outcome: Progressing 07/15/2020 2148 by Jocelyn Lamer, RN Outcome: Progressing   Problem: Coping: Goal: Level of anxiety will decrease 07/16/2020 0509 by Jocelyn Lamer, RN Outcome: Progressing 07/15/2020 2148 by Jocelyn Lamer, RN Outcome: Progressing   Problem: Elimination: Goal: Will not experience complications related to bowel motility 07/16/2020 0509 by Jocelyn Lamer, RN Outcome: Progressing 07/15/2020 2148 by Jocelyn Lamer, RN Outcome: Progressing Goal: Will not experience complications related to urinary retention 07/16/2020 0509 by Jocelyn Lamer, RN Outcome: Progressing 07/15/2020 2148 by Jocelyn Lamer, RN Outcome: Progressing   Problem: Pain Managment: Goal: General experience of comfort will improve 07/16/2020 0509 by Jocelyn Lamer, RN Outcome: Progressing 07/15/2020 2148 by Jocelyn Lamer, RN Outcome: Progressing   Problem: Safety: Goal: Ability to remain free from injury will improve 07/16/2020 0509 by Jocelyn Lamer, RN Outcome: Progressing 07/15/2020 2148 by Jocelyn Lamer, RN Outcome: Progressing   Problem: Skin Integrity: Goal: Risk for impaired skin integrity will decrease 07/16/2020 0509 by Jocelyn Lamer, RN Outcome: Progressing 07/15/2020 2148 by Jocelyn Lamer, RN Outcome: Progressing

## 2020-07-16 NOTE — Progress Notes (Signed)
OT Cancellation Note  Patient Details Name: Kaylee [name] MRN: [id number] DOB: 22-Mar-1941   Cancelled Treatment:    Reason Eval/Treat Not Completed: Patient at procedure or test/ unavailable  OT consult received and chart reviewed. Pt currently OTF to MRI at this time. Will f/u for OT evaluation at later date/time as able/pt available. Thank you.  Gerrianne Scale, [place], OTR/L ascom [phone] 07/16/20, 10:44 AM

## 2020-07-16 NOTE — Consult Note (Signed)
NEUROLOGY CONSULTATION NOTE   Date of service: July 16, 2020 Patient Name: Kaylee [name] MRN:  [id number] DOB:  1941/02/24 Reason for consult: syncope, abnl brain imaging _ _ _   _ __   _ __ _ _  __ __   _ __   __ _  History of Present Illness   This is a 80 yo woman with hx CVA Jul 23, 2017, HTN, HL, hypothyroidism admitted after episode of syncope at home. Neurology is consulted 2/2 interval development of ischemic infarcts on brain imaging since last scan 23-Jul-2017.  Patient reports that she was tired from family stressors and was sitting down with her sister when she became flushed and felt hot lasting seconds. Her sister then reported she briefly lost consciousness (also seconds) then came to but still felt woozy. She was orthostatic with SBP 90s on EMS arrival.   Patient reports feeling well today and denies focal neurologic deficits. No weakness or numbness, dizziness, trouble speaking. She uses walker for balance and ambulated with PT earlier today; reports she felt her gait was baseline today.  She had no residual deficits from CVA 2017/07/23. MRA head was performed at that time but was severely motion-degraded, imaging of neck vessels was not performed. She takes ASA 81mg  daily at home and reports compliance. TTE ordered and is pending.   Data  MRI HEAD WITHOUT CONTRAST  TECHNIQUE: Multiplanar, multiecho pulse sequences of the brain and surrounding structures were obtained without intravenous contrast.  COMPARISON:  Head CT yesterday.  Brain MRI 02/06/2018.  FINDINGS: Brain: No restricted diffusion to suggest acute infarction. No midline shift, mass effect, evidence of mass lesion, ventriculomegaly, extra-axial collection or acute intracranial hemorrhage. Cervicomedullary junction and pituitary are within normal limits.  Multifocal chronic bilateral MCA territory cortical and subcortical white matter encephalomalacia corresponds to the hypodense areas seen on CT yesterday, and most  of these are new from the 07-23-17 exams. Associated hemosiderin in both hemispheres, also new since Jul 23, 2017 and most pronounced in the left parietal lobe (series 13, image 40 on SWI today). Scattered additional cerebral white matter T2 and FLAIR hyperintensity has also progressed. The deep gray matter nuclei and brainstem remain within normal limits. Small chronic cerebellar infarcts are stable since July 23, 2017 and more pronounced on the left.  Vascular: Major intracranial vascular flow voids are stable since 2017-07-23 with intracranial artery tortuosity.  Skull and upper cervical spine: Degenerative ligamentous hypertrophy about the odontoid is chronic. Visualized bone marrow signal is within normal limits.  Sinuses/Orbits: Negative orbits. Mild paranasal sinus mucosal thickening is new since 07-23-2017. No sinus fluid levels.  Other: Mastoids remain clear. Negative visible orbit and scalp soft tissues.  IMPRESSION: 1. No acute intracranial abnormality. 2. 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  CNS imaging personally reviewed; I agree with above interpretation. There are no acute areas of infarct or acute blood products but there has been interval progression of chronic ischemic disease with encephalomalacia since last scan Jul 23, 2017.   ROS   10 point review of systems was performed and was negative except as described in HPI.  Past History   Past Medical History:  Diagnosis Date  . Depression   . Fainting spell   . Frequent headaches   . Hyperlipidemia   . Hypertension   . Migraine   . Stroke ([place])   . Thyroid disease   . UTI (urinary tract infection)  Past Surgical History:  Procedure Laterality Date  . LOOP RECORDER INSERTION N/A 02/24/2018   Procedure: LOOP RECORDER INSERTION;  Surgeon: Deboraha Sprang, MD;  Location: [place]  [facility];  Service: Cardiovascular;  Laterality: N/A;  . TEE WITHOUT CARDIOVERSION N/A 02/24/2018   Procedure: TRANSESOPHAGEAL ECHOCARDIOGRAM (TEE);  Surgeon: Minna Merritts, MD;  Location: [facility];  Service: Cardiovascular;  Laterality: N/A;   Family History  Problem Relation Age of Onset  . Dementia Mother   . Hypertension Mother   . Hypothyroidism Mother   . Prostate cancer Father    Social History   Socioeconomic History  . Marital status: Widowed    Spouse name: Not on file  . Number of children: Not on file  . Years of education: Not on file  . Highest education level: Not on file  Occupational History  . Not on file  Tobacco Use  . Smoking status: Never Smoker  . Smokeless tobacco: Never Used  Vaping Use  . Vaping Use: Never used  Substance and Sexual Activity  . Alcohol use: Not Currently  . Drug use: Not Currently  . Sexual activity: Not Currently  Other Topics Concern  . Not on file  Social History Narrative  . Not on file   Social Determinants of Health   Financial Resource Strain: Not on file  Food Insecurity: Not on file  Transportation Needs: Not on file  Physical Activity: Not on file  Stress: Not on file  Social Connections: Not on file   No Known Allergies  Medications   Medications Prior to Admission  Medication Sig Dispense Refill Last Dose  . aspirin EC 81 MG EC tablet Take 1 tablet (81 mg total) by mouth daily. 30 tablet 0 07/15/2020 at AM  . Cholecalciferol (VITAMIN D3) 50 MCG (2000 UT) capsule Take 1 capsule (2,000 Units total) by mouth daily. 90 capsule 1 07/15/2020 at AM  . Multiple Vitamin (MULTIVITAMIN WITH MINERALS) TABS tablet Take 1 tablet by mouth daily.   07/15/2020 at AM  . atorvastatin (LIPITOR) 40 MG tablet Take 1 tablet (40 mg total) by mouth daily at 6 PM. (Patient not taking: Reported on 07/15/2020) 90 tablet 3 Not Taking at Unknown time     Vitals   Vitals:   07/16/20 0549 07/16/20 0823 07/16/20 1132 07/16/20 1619  BP: (!)  163/79 (!) 155/74 (!) 158/80 (!) 162/90  Pulse: 70 70 73 69  Resp: 16 15 16 16   Temp: 98.6 F (37 C) 98.1 F (36.7 C) 98.6 F (37 C) 98.8 F (37.1 C)  TempSrc:  Oral Oral Oral  SpO2: 100% 100% 100% 100%  Weight:      Height:         Body mass index is 28.53 kg/m.  Physical Exam   Physical Exam Gen: A&O x4, NAD HEENT: Atraumatic, normocephalic;mucous membranes moist; oropharynx clear, tongue without atrophy or fasciculations. Neck: Supple, trachea midline. Resp: CTAB, no w/r/r CV: RRR, no m/g/r; nml S1 and S2. 2+ symmetric peripheral pulses. Abd: soft/NT/ND; nabs x 4 quad Extrem: Nml bulk; no cyanosis, clubbing, or edema.  Neuro: *MS: A&O x4. Naming and repetition intact. Follows multi-step commands.  *Speech: fluid, nondysarthric. Able to name and repeat. *CN:    I: Deferred   II,III: PERRLA, VFF by confrontation, optic discs sharp   III,IV,VI: EOMI w/o nystagmus, no ptosis   V: Sensation intact from V1 to V3 to LT   VII: Eyelid closure was full.  Smile symmetric.   VIII:  Hearing intact to voice   IX,X: Voice normal, palate elevates symmetrically    XI: SCM/trap 5/5 bilat   XII: Tongue protrudes midline, no atrophy or fasciculations   *Motor:   Normal bulk.  No tremor, rigidity or bradykinesia. No pronator drift.    Strength: Dlt Bic Tri WrE WrF FgS Gr HF KnF KnE PlF DoF    Left 5 5 5 5 5 5 5 5 5 5 5 5     Right 5 5 5 5 5 5 5 5 5 5 5 5     *Sensory: Intact to light touch, pinprick, temperature vibration throughout. Symmetric. Propioception intact bilat.  No double-simultaneous extinction.  *Coordination:  Finger-to-nose, heel-to-shin, rapid alternating motions were intact. *Reflexes:  2+ and symmetric throughout without clonus; toes down-going bilat *Gait: deferred. Ambulated with PT earlier with her home walker and she and PT both reported gait seemed to be at her recent baseline.   NIHSS = 0   Labs   CBC:  Recent Labs  Lab 07/15/20 1221  WBC 8.4   NEUTROABS 4.3  HGB 12.7  HCT 39.4  MCV 97.8  PLT 135*    Basic Metabolic Panel:  Lab Results  Component Value Date   NA 142 07/15/2020   K 3.8 07/15/2020   CO2 25 07/15/2020   GLUCOSE 155 (H) 07/15/2020   BUN 19 07/15/2020   CREATININE 1.08 (H) 07/15/2020   CALCIUM 8.7 (L) 07/15/2020   GFRNONAA 52 (L) 07/15/2020   GFRAA >60 02/05/2018   Lipid Panel:  Lab Results  Component Value Date   LDLCALC 99 07/16/2020   HgbA1c:  Lab Results  Component Value Date   HGBA1C 5.6 07/16/2020   Urine Drug Screen:     Component Value Date/Time   LABOPIA NONE DETECTED 07/15/2020 1221   COCAINSCRNUR NONE DETECTED 07/15/2020 1221   LABBENZ NONE DETECTED 07/15/2020 1221   AMPHETMU NONE DETECTED 07/15/2020 1221   THCU NONE DETECTED 07/15/2020 1221   LABBARB NONE DETECTED 07/15/2020 1221    Alcohol Level No results found for: ETH   Impression   This is a 80 yo woman with hx CVA 2019, HTN, HL, hypothyroidism admitted after episode of syncope at home. Neurology is consulted 2/2 interval development of ischemic infarcts on brain imaging since last scan 2019. There are no acute findings on her brain MRI however she has significant ischemic disease in the bilateral MCA territories that has markedly progressed since 2019. Her neurologic exam is completely intact. Her syncope is most likely vasovagal +/- orthostatic but would recommend the following workup to optimize her for secondary stroke prevention and to r/o vertebrobasilar insufficiency to her syncope.  Recommendations   - CTA H&N ordered - TTE ordered - Asprin switched to plavix 75mg  daily for secondary stroke prevention - Check A1c, LDL + statin per guidelines - Stroke education - Outpatient f/u with neurology  Will continue to follow. ______________________________________________________________________   Thank you for the opportunity to take part in the care of this patient. If you have any further questions, please contact  the neurology consultation attending.  Signed,  Su Monks, MD [facility] [phone]  If 7pm- 7am, please page neurology on call as listed in [place].

## 2020-07-17 ENCOUNTER — Inpatient Hospital Stay
Admit: 2020-07-17 | Discharge: 2020-07-17 | Disposition: A | Discharge status: Home / Self Care | Payer: Medicare Other | Attending: Family Medicine | Admitting: Family Medicine

## 2020-07-17 DIAGNOSIS — I10 Essential (primary) hypertension: Secondary | ICD-10-CM | POA: Diagnosis not present

## 2020-07-17 DIAGNOSIS — E782 Mixed hyperlipidemia: Secondary | ICD-10-CM

## 2020-07-17 DIAGNOSIS — E039 Hypothyroidism, unspecified: Secondary | ICD-10-CM | POA: Diagnosis not present

## 2020-07-17 DIAGNOSIS — M898X8 Other specified disorders of bone, other site: Secondary | ICD-10-CM

## 2020-07-17 LAB — ECHOCARDIOGRAM COMPLETE
AV Mean grad: 5 mmHg
AV Peak grad: 8.1 mmHg
Ao pk vel: 1.42 m/s
Area-P 1/2: 3.01 cm2
Height: 64 in
S' Lateral: 2.3 cm
Weight: 2659.63 oz

## 2020-07-17 LAB — COMPREHENSIVE METABOLIC PANEL
ALT: 14 U/L (ref 0–44)
AST: 23 U/L (ref 15–41)
Albumin: 3 g/dL — ABNORMAL LOW (ref 3.5–5.0)
Alkaline Phosphatase: 64 U/L (ref 38–126)
Anion gap: 4 — ABNORMAL LOW (ref 5–15)
BUN: 11 mg/dL (ref 8–23)
CO2: 26 mmol/L (ref 22–32)
Calcium: 8.9 mg/dL (ref 8.9–10.3)
Chloride: 111 mmol/L (ref 98–111)
Creatinine, Ser: 0.71 mg/dL (ref 0.44–1.00)
GFR, Estimated: >60 mL/min (ref >60)
Glucose, Bld: 87 mg/dL (ref 70–99)
Potassium: 3.7 mmol/L (ref 3.5–5.1)
Sodium: 141 mmol/L (ref 135–145)
Total Bilirubin: 0.7 mg/dL (ref 0.3–1.2)
Total Protein: 5.9 g/dL — ABNORMAL LOW (ref 6.5–8.1)

## 2020-07-17 LAB — CBC
HCT: 36.2 % (ref 36.0–46.0)
Hemoglobin: 11.9 g/dL — ABNORMAL LOW (ref 12.0–15.0)
MCH: 31.2 pg (ref 26.0–34.0)
MCHC: 32.9 g/dL (ref 30.0–36.0)
MCV: 94.8 fL (ref 80.0–100.0)
Platelets: 147 10*3/uL — ABNORMAL LOW (ref 150–400)
RBC: 3.82 MIL/uL — ABNORMAL LOW (ref 3.87–5.11)
RDW: 13.1 % (ref 11.5–15.5)
WBC: 7.8 10*3/uL (ref 4.0–10.5)
nRBC: 0 % (ref 0.0–0.2)

## 2020-07-17 MED ORDER — CLOPIDOGREL BISULFATE 75 MG PO TABS: 75.0000 mg | ORAL_TABLET | Freq: Every day | ORAL | 11 refills | Status: AC

## 2020-07-17 MED ORDER — LIDOCAINE-EPINEPHRINE 1 %-1:100000 IJ SOLN
10.0000 mL | Freq: Once | INTRAMUSCULAR | Status: AC
  Administered 2020-07-17: 12:00:00 10 mL
  Filled 2020-07-17: qty 10

## 2020-07-17 MED ORDER — ATORVASTATIN CALCIUM 20 MG PO TABS: 40.0000 mg | ORAL_TABLET | Freq: Every day | ORAL | Status: DC

## 2020-07-17 NOTE — Progress Notes (Signed)
[facility]  PROGRESS NOTE  Kaylee [name]:[id number] DOB: 1940/11/09 DOA: 07/15/2020 PCP: Pcp, No   Brief HPI:   80 year old female with a history of CVA in 2019, hypertension, hyperlipidemia, hypothyroidism who presented with syncope from home.  Patient was having brunch with her brothers and one of her sisters when she suddenly became unresponsive and had syncopal episode.  There was no shaking activity noted, no loss of bladder or bowel control.  She was found to be hypotensive when EMS arrived, her systolic blood pressure was in the 90s. Initial imaging with chest x-ray showed concern for pneumoperitoneum below the diaphragm with no acute chest pathology.  CT abdomen/pelvis was done which did not show pneumoperitoneum or perforated viscus.  She also was found to have a sternal lesion and ultrasound was ordered as per general surgery recommendation. CT head without contrast showed changes concerning for acute or subacute infarcts in the anterior right frontal lobe and high bilateral posterior frontal lobes.  Neuro exam was normal.  MRI brain was ordered.    Subjective    Patient seen and examined, denies any complaints.  Started on Plavix per neurology.  No further intervention recommended.  Lipid profile showed LDL of 99.   Assessment/Plan:     1. Syncope-likely vasovagal, she had prodromal symptoms before with rapid resolution of syncope.  Continue telemetry monitoring.  She has positive orthostatic hypotension.  We will continue to monitor patient's orthostatic vital signs every Echocardiogram obtained, will follow result. 2. History of CVA-CT head was concerning for new areas of ischemia acute versus subacute infarct.  MRI brain obtained this morning shows no acute intracranial pathology.  Neurology was consulted and has seen the patient.  Aspirin switched to Plavix.   CTA of head and neck showed no evidence of vertebrobasilar insufficiency. 3. Lactic acidosis-likely from  hypotension.  Lactic acid is 3.0.  No signs of sepsis.  No infectious process suspected. 4. 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  General surgery is following for biopsy this morning.  Plavix is on hold for biopsy. 5. Right kidney lesion-indeterminate small lesion involving the lower pole of the right kidney was noted with differential including focal inflammation, complex cyst versus solid mass on CT abdomen pelvis obtained yesterday.  Recommend to obtain reading MRI or CT with and without contrast as outpatient.    Scheduled medications:   . cholecalciferol  2,000 Units Oral Daily  . lidocaine-EPINEPHrine  10 mL Infiltration Once  . multivitamin with minerals  1 tablet Oral Daily         Data Reviewed:   CBG:  Recent Labs  Lab 07/15/20 1227  GLUCAP 133*    SpO2: 100 %    Vitals:   07/16/20 2220 07/17/20 0509 07/17/20 0843 07/17/20 1230  BP: (!) 168/91 (!) 159/79 139/82 (!) 152/70  Pulse: 85 69 64 69  Resp: 16 16 20 15   Temp: 98 F (36.7 C) 98.7 F (37.1 C) 98.6 F (37 C) 98.7 F (37.1 C)  TempSrc: Oral  Oral Oral  SpO2: 100% 96% 99% 100%  Weight:      Height:         Intake/Output Summary (Last 24 hours) at 07/17/2020 1333 Last data filed at 07/16/2020 1948 Gross per 24 hour  Intake 150 ml  Output --  Net 150 ml    04/02 1901 - 04/04 0700 In: 1265.2 [P.O.:250; I.V.:249.3] Out: -   Filed Weights   07/15/20 1211 07/16/20  0347  Weight: 66 kg 75.4 kg    CBC:  Recent Labs  Lab 07/15/20 1221 07/17/20 0458  WBC 8.4 7.8  HGB 12.7 11.9*  HCT 39.4 36.2  PLT 135* 147*  MCV 97.8 94.8  MCH 31.5 31.2  MCHC 32.2 32.9  RDW 12.9 13.1  LYMPHSABS 3.3  --   MONOABS 0.5  --   EOSABS 0.3  --   BASOSABS 0.1  --     Complete metabolic panel:  Recent Labs  Lab 07/15/20 1221 07/15/20 1456 07/15/20 2236 07/16/20 0030 07/17/20 0458  NA 142  --   --   --  141  K 3.8  --   --   --  3.7  CL 110  --   --    --  111  CO2 25  --   --   --  26  GLUCOSE 155*  --   --   --  87  BUN 19  --   --   --  11  CREATININE 1.08*  --   --   --  0.71  CALCIUM 8.7*  --   --   --  8.9  AST 29  --   --   --  23  ALT 12  --   --   --  14  ALKPHOS 67  --   --   --  64  BILITOT 0.8  --   --   --  0.7  ALBUMIN 3.1*  --   --   --  3.0*  LATICACIDVEN 3.1* 3.3* 3.8* 3.0*  --   HGBA1C  --   --   --  5.6  --     No results for input(s): LIPASE, AMYLASE in the last 168 hours.  Recent Labs  Lab 07/15/20 1221  SARSCOV2NAA NEGATIVE    ------------------------------------------------------------------------------------------------------------------ Recent Labs    07/16/20 0030  CHOL 174  HDL 69  LDLCALC 99  TRIG 29  CHOLHDL 2.5    Lab Results  Component Value Date   HGBA1C 5.6 07/16/2020   ------------------------------------------------------------------------------------------------------------------ No results for input(s): TSH, T4TOTAL, T3FREE, THYROIDAB in the last 72 hours.  Invalid input(s): FREET3 ------------------------------------------------------------------------------------------------------------------ No results for input(s): VITAMINB12, FOLATE, FERRITIN, TIBC, IRON, RETICCTPCT in the last 72 hours.  Coagulation profile  No results for input(s): INR, PROTIME in the last 168 hours.  No results for input(s): DDIMER in the last 72 hours.  Cardiac Enzymes  No results for input(s): CKMB, TROPONINI, MYOGLOBIN in the last 168 hours.  Invalid input(s): CK ------------------------------------------------------------------------------------------------------------------ No results found for: BNP   Antibiotics: Anti-infectives (From admission, onward)   Start     Dose/Rate Route Frequency Ordered Stop   07/15/20 1315  piperacillin-tazobactam (ZOSYN) IVPB 3.375 g        3.375 g 100 mL/hr over 30 Minutes Intravenous  Once 07/15/20 1303 07/15/20 1436       Radiology Reports  CT  ANGIO HEAD W OR WO CONTRAST  Result Date: 07/16/2020 CLINICAL DATA:  Dizziness, persistent/recurrent, cardiac or vascular cause suspected. Stroke/TIA. Assess intracranial arteries. Progressive diffuse white matter disease. EXAM: CT ANGIOGRAPHY HEAD AND NECK TECHNIQUE: Multidetector CT imaging of the head and neck was performed using the standard protocol during bolus administration of intravenous contrast. Multiplanar CT image reconstructions and MIPs were obtained to evaluate the vascular anatomy. Carotid stenosis measurements (when applicable) are obtained utilizing NASCET criteria, using the distal internal carotid diameter as the denominator. CONTRAST:  64mL OMNIPAQUE IOHEXOL 350 MG/ML SOLN  COMPARISON:  MR head without contrast 07/16/2020. CT head without contrast 4/2/2. FINDINGS: CT HEAD FINDINGS Brain: Extensive periventricular and subcortical white matter disease is present bilaterally. Remote bilateral frontal lobe infarcts present. Medial remote parietal lobe infarcts noted bilateral. No acute infarct or hemorrhage is present. The ventricles are proportionate to the degree of atrophy. No significant extraaxial fluid collection is present. Remote lacunar infarct present in the left cerebellum. Brainstem and cerebellum are otherwise within normal limits. Vascular: Atherosclerotic calcifications are present within the cavernous internal carotid arteries bilaterally. No hyperdense vessel is present. Skull: Calvarium is intact. No focal lytic or blastic lesions are present. No significant extracranial soft tissue lesion is present. Sinuses: The paranasal sinuses and mastoid air cells are clear. Orbits: The globes and orbits are within normal limits. Review of the MIP images confirms the above findings CTA NECK FINDINGS Aortic arch: A 3 vessel arch configuration is present. No significant vascular calcifications are present. Right carotid system: Mild tortuosity is present cervical right ICA without significant  stenosis. Minimal atherosclerotic irregularity is present at the carotid bifurcation. No significant stenosis is present. The cervical right ICA is within normal limits. Left carotid system: Mild tortuosity is present in the left common carotid artery without significant stenosis. Mild atherosclerotic changes are noted at the proximal left ICA. Focal tortuosity is present in the cervical left ICA with mild narrowing of less than 50%. The more distal cervical left ICA is normal. Vertebral arteries: The left vertebral artery is slightly dominant to the right. Both vertebral arteries originate from the subclavian arteries without significant stenosis. There is no significant stenosis of either vertebral artery in the neck. Skeleton: Slight degenerative anterolisthesis present at C3-4 and C4-5. Chronic loss of disc height noted at C5-6 and C6-7. Uncovertebral spurring contributes to foraminal narrowing worse on the left at C5-6 and C6-7 and on the right at C4-5 and C5-6. No focal lytic or blastic lesions are present. Other neck: The soft tissues the neck are otherwise unremarkable. No focal mucosal lesions are present. No significant adenopathy is present. Thyroid is normal. Salivary glands are within normal limits. Upper chest: The lung apices are clear. Thoracic inlet is within normal limits. Review of the MIP images confirms the above findings CTA HEAD FINDINGS Anterior circulation: The internal carotid arteries are within normal limits from the skull base through the ICA termini. The A1 and M1 segments are normal. Anterior communicating artery is patent. MCA bifurcations are within normal limits. The ACA and MCA branch vessels are normal. Posterior circulation: Left vertebral artery is the dominant vessel. PICA origins are visualized and normal. The vertebrobasilar junction is normal. Basilar artery is within normal limits. Both posterior cerebral arteries originate from basilar tip. PCA branch vessels are normal.  Venous sinuses: Dural sinuses are patent. Straight sinus deep cerebral veins are within normal limits. Right transverse sinus is dominant. Straight sinus deep and cerebral veins are intact. Cortical veins are unremarkable. Anatomic variants: None Review of the MIP images confirms the above findings IMPRESSION: 1. Focal tortuosity of the left cervical ICA with narrowing of less than 50%. 2. Mild tortuosity of the cervical internal carotid arteries bilaterally without other significant stenosis. 3. Mild atherosclerotic changes at the carotid bifurcations bilaterally without significant stenosis. 4. Normal variant CTA Circle of Willis without significant proximal stenosis, aneurysm, or branch vessel occlusion. 5. Multilevel spondylosis of the cervical spine as described. Electronically Signed   By: San Morelle M.D.   On: 07/16/2020 15:06   CT ANGIO NECK  W OR WO CONTRAST  Result Date: 07/16/2020 CLINICAL DATA:  Dizziness, persistent/recurrent, cardiac or vascular cause suspected. Stroke/TIA. Assess intracranial arteries. Progressive diffuse white matter disease. EXAM: CT ANGIOGRAPHY HEAD AND NECK TECHNIQUE: Multidetector CT imaging of the head and neck was performed using the standard protocol during bolus administration of intravenous contrast. Multiplanar CT image reconstructions and MIPs were obtained to evaluate the vascular anatomy. Carotid stenosis measurements (when applicable) are obtained utilizing NASCET criteria, using the distal internal carotid diameter as the denominator. CONTRAST:  15mL OMNIPAQUE IOHEXOL 350 MG/ML SOLN COMPARISON:  MR head without contrast 07/16/2020. CT head without contrast 4/2/2. FINDINGS: CT HEAD FINDINGS Brain: Extensive periventricular and subcortical white matter disease is present bilaterally. Remote bilateral frontal lobe infarcts present. Medial remote parietal lobe infarcts noted bilateral. No acute infarct or hemorrhage is present. The ventricles are proportionate  to the degree of atrophy. No significant extraaxial fluid collection is present. Remote lacunar infarct present in the left cerebellum. Brainstem and cerebellum are otherwise within normal limits. Vascular: Atherosclerotic calcifications are present within the cavernous internal carotid arteries bilaterally. No hyperdense vessel is present. Skull: Calvarium is intact. No focal lytic or blastic lesions are present. No significant extracranial soft tissue lesion is present. Sinuses: The paranasal sinuses and mastoid air cells are clear. Orbits: The globes and orbits are within normal limits. Review of the MIP images confirms the above findings CTA NECK FINDINGS Aortic arch: A 3 vessel arch configuration is present. No significant vascular calcifications are present. Right carotid system: Mild tortuosity is present cervical right ICA without significant stenosis. Minimal atherosclerotic irregularity is present at the carotid bifurcation. No significant stenosis is present. The cervical right ICA is within normal limits. Left carotid system: Mild tortuosity is present in the left common carotid artery without significant stenosis. Mild atherosclerotic changes are noted at the proximal left ICA. Focal tortuosity is present in the cervical left ICA with mild narrowing of less than 50%. The more distal cervical left ICA is normal. Vertebral arteries: The left vertebral artery is slightly dominant to the right. Both vertebral arteries originate from the subclavian arteries without significant stenosis. There is no significant stenosis of either vertebral artery in the neck. Skeleton: Slight degenerative anterolisthesis present at C3-4 and C4-5. Chronic loss of disc height noted at C5-6 and C6-7. Uncovertebral spurring contributes to foraminal narrowing worse on the left at C5-6 and C6-7 and on the right at C4-5 and C5-6. No focal lytic or blastic lesions are present. Other neck: The soft tissues the neck are otherwise  unremarkable. No focal mucosal lesions are present. No significant adenopathy is present. Thyroid is normal. Salivary glands are within normal limits. Upper chest: The lung apices are clear. Thoracic inlet is within normal limits. Review of the MIP images confirms the above findings CTA HEAD FINDINGS Anterior circulation: The internal carotid arteries are within normal limits from the skull base through the ICA termini. The A1 and M1 segments are normal. Anterior communicating artery is patent. MCA bifurcations are within normal limits. The ACA and MCA branch vessels are normal. Posterior circulation: Left vertebral artery is the dominant vessel. PICA origins are visualized and normal. The vertebrobasilar junction is normal. Basilar artery is within normal limits. Both posterior cerebral arteries originate from basilar tip. PCA branch vessels are normal. Venous sinuses: Dural sinuses are patent. Straight sinus deep cerebral veins are within normal limits. Right transverse sinus is dominant. Straight sinus deep and cerebral veins are intact. Cortical veins are unremarkable. Anatomic variants: None  Review of the MIP images confirms the above findings IMPRESSION: 1. Focal tortuosity of the left cervical ICA with narrowing of less than 50%. 2. Mild tortuosity of the cervical internal carotid arteries bilaterally without other significant stenosis. 3. Mild atherosclerotic changes at the carotid bifurcations bilaterally without significant stenosis. 4. Normal variant CTA Circle of Willis without significant proximal stenosis, aneurysm, or branch vessel occlusion. 5. Multilevel spondylosis of the cervical spine as described. Electronically Signed   By: San Morelle M.D.   On: 07/16/2020 15:06   MR BRAIN WO CONTRAST  Result Date: 07/16/2020 CLINICAL DATA:  80 year old female with unexplained altered mental status. Plain head CT suspicious for acute to subacute frontal lobe infarcts. EXAM: MRI HEAD WITHOUT  CONTRAST TECHNIQUE: Multiplanar, multiecho pulse sequences of the brain and surrounding structures were obtained without intravenous contrast. COMPARISON:  Head CT yesterday.  Brain MRI 02/06/2018. FINDINGS: Brain: No restricted diffusion to suggest acute infarction. No midline shift, mass effect, evidence of mass lesion, ventriculomegaly, extra-axial collection or acute intracranial hemorrhage. Cervicomedullary junction and pituitary are within normal limits. Multifocal chronic bilateral MCA territory cortical and subcortical white matter encephalomalacia corresponds to the hypodense areas seen on CT yesterday, and most of these are new from the 07/21/17 exams. Associated hemosiderin in both hemispheres, also new since 21-Jul-2017 and most pronounced in the left parietal lobe (series 13, image 40 on SWI today). Scattered additional cerebral white matter T2 and FLAIR hyperintensity has also progressed. The deep gray matter nuclei and brainstem remain within normal limits. Small chronic cerebellar infarcts are stable since 2017/07/21 and more pronounced on the left. Vascular: Major intracranial vascular flow voids are stable since 2017-07-21 with intracranial artery tortuosity. Skull and upper cervical spine: Degenerative ligamentous hypertrophy about the odontoid is chronic. Visualized bone marrow signal is within normal limits. Sinuses/Orbits: Negative orbits. Mild paranasal sinus mucosal thickening is new since 21-Jul-2017. No sinus fluid levels. Other: Mastoids remain clear. Negative visible orbit and scalp soft tissues. IMPRESSION: 1. No acute intracranial abnormality. 2. 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 Electronically Signed   By: Genevie Ann M.D.   On: 07/16/2020 11:10   CT ABDOMEN PELVIS W CONTRAST  Result Date: 07/15/2020 CLINICAL DATA:  Altered mental status with syncopal episode  and hypotension. Pneumoperitoneum on chest radiographs. EXAM: CT ABDOMEN AND PELVIS WITH CONTRAST TECHNIQUE: Multidetector CT imaging of the abdomen and pelvis was performed using the standard protocol following bolus administration of intravenous contrast. CONTRAST:  145mL OMNIPAQUE IOHEXOL 300 MG/ML  SOLN COMPARISON:  Chest radiographs same date. Abdominopelvic CT 11/08/2012. FINDINGS: Lower chest: Clear lung bases. No significant pleural or pericardial effusion. No basilar pneumothorax. Mild aortic atherosclerosis. Hepatobiliary: The liver is normal in density without suspicious focal abnormality. No evidence of gallstones, gallbladder wall thickening or biliary dilatation. Pancreas: Unremarkable. No pancreatic ductal dilatation or surrounding inflammatory changes. Spleen: Normal in size without focal abnormality. Adrenals/Urinary Tract: Both adrenal glands appear normal. There is a small nonobstructing calculus in the lower pole of the left kidney. There are small bilateral renal cysts which have mildly enlarged compared with the previous study. Renal sinus cysts are present bilaterally without hydronephrosis or delayed contrast excretion. In the lower pole of the right kidney, there is an ill-defined low-density lesion measuring 1.6 x 1.5 cm on image 37/2. This is an indeterminate finding which could reflect a complex cyst, small mass or focal inflammation. The bladder is  decompressed by a Foley catheter. Stomach/Bowel: There is a large amount of ingested material within the stomach. There is moderate stool throughout the colon. No bowel wall thickening, significant distention or surrounding inflammation. The appendix appears normal. Vascular/Lymphatic: There are no enlarged abdominal or pelvic lymph nodes. No acute vascular findings. Mild aortic and branch vessel atherosclerosis. The portal, superior mesenteric and splenic veins are patent. Reproductive: Stable globular appearance of the uterus suspicious for  small fibroids. No adnexal mass. Other: There is no free intraperitoneal air. Suggested pneumoperitoneum on prior chest radiographs is attributed to gas in the colon and stomach. There is colonic interposition between the liver and right hemidiaphragm. No ascites or focal extraluminal fluid collection. Musculoskeletal: No acute or significant osseous findings. There are degenerative changes throughout the lumbar spine. There is a chronic Schmorl's node involving the inferior endplate of L1 which appears grossly stable. IMPRESSION: 1. No evidence of pneumoperitoneum or perforated abdominal viscus. The suggested abnormality on prior chest radiograph is due to luminal gas in the stomach and colon. 2. Indeterminate small lesion involving the lower pole of the right kidney with differential considerations including focal inflammation, complex cyst and solid mass. Correlation with urine analysis recommended. Recommend follow-up dedicated renal MRI or CT with and without contrast. 3. No other acute findings. Prominent ingested material in the stomach and prominent stool throughout the colon. 4. Nonobstructing left renal calculus. 5. Mild Aortic Atherosclerosis (ICD10-I70.0). Electronically Signed   By: Richardean Sale M.D.   On: 07/15/2020 14:33   [place] CHEST SOFT TISSUE  Result Date: 07/15/2020 CLINICAL DATA:  Midline open sternal wound to the chest. Started after a fall and progressed over a year. EXAM: ULTRASOUND OF CHEST SOFT TISSUES TECHNIQUE: Ultrasound examination of the chest wall soft tissues was performed in the area of clinical concern. COMPARISON:  None. FINDINGS: Ultrasound images of the sternal wound demonstrate a circumscribed heterogeneous hypoechoic region extending from the skin surface to the anterior sternum and measuring 3 x 3 x 2.6 cm. Patchy flow is demonstrated within the lesion on color flow Doppler imaging. Appearance suggest a mass lesion. Given the chronic nature of the lesion, it likely  represents a infiltrating soft tissue mass. Alternatively, chronic osteomyelitis with abscess and cellulitis would be a possibility. IMPRESSION: Circumscribed heterogeneous hypoechoic mass in the sternal wound may represent an infiltrating soft tissue mass, or less likely chronic osteomyelitis with abscess and cellulitis. Electronically Signed   By: Lucienne Capers M.D.   On: 07/15/2020 22:11      DVT prophylaxis: Lovenox  Code Status: Full code  Family Communication: No family at bedside   Consultants:    Procedures:      Objective    Physical Examination:  General-appears in no acute distress Heart-S1-S2, regular, no murmur auscultated Chest wall-large fungating mass in the upper mid sternum noted. Lungs-clear to auscultation bilaterally, no wheezing or crackles auscultated Abdomen-soft, nontender, no organomegaly Extremities-no edema in the lower extremities Neuro-alert, oriented x3, no focal deficit noted  Status is: Inpatient  Dispo: The patient is from: Home              Anticipated d/c is to: To be decided              Anticipated d/c date is: 07/18/2020              Patient currently not stable for discharge  Barrier to discharge-ongoing evaluation for syncope  COVID-19 Labs  No results for input(s): DDIMER, FERRITIN, LDH, CRP in the  last 72 hours.  Lab Results  Component Value Date   [place] NEGATIVE 07/15/2020    Microbiology  Recent Results (from the past 240 hour(s))  Resp Panel by RT-PCR (Flu A&B, Covid) Nasopharyngeal Swab     Status: None   Collection Time: 07/15/20 12:21 PM   Specimen: Nasopharyngeal Swab; Nasopharyngeal(NP) swabs in vial transport medium  Result Value Ref Range Status   SARS Coronavirus 2 by RT PCR NEGATIVE NEGATIVE Final    Comment: (NOTE) SARS-CoV-2 target nucleic acids are NOT DETECTED.  The SARS-CoV-2 RNA is generally detectable in upper respiratory specimens during the acute phase of infection. The  lowest concentration of SARS-CoV-2 viral copies this assay can detect is 138 copies/mL. A negative result does not preclude SARS-Cov-2 infection and should not be used as the sole basis for treatment or other patient management decisions. A negative result may occur with  improper specimen collection/handling, submission of specimen other than nasopharyngeal swab, presence of viral mutation(s) within the areas targeted by this assay, and inadequate number of viral copies(<138 copies/mL). A negative result must be combined with clinical observations, patient history, and epidemiological information. The expected result is Negative.  Fact Sheet for Patients:  EntrepreneurPulse.com.au  Fact Sheet for Healthcare Providers:  IncredibleEmployment.be  This test is no t yet approved or cleared by the [place] FDA and  has been authorized for detection and/or diagnosis of SARS-CoV-2 by FDA under an Emergency Use Authorization (EUA). This EUA will remain  in effect (meaning this test can be used) for the duration of the COVID-19 declaration under Section 564(b)(1) of the Act, 21 U.S.C.section 360bbb-3(b)(1), unless the authorization is terminated  or revoked sooner.       Influenza A by PCR NEGATIVE NEGATIVE Final   Influenza B by PCR NEGATIVE NEGATIVE Final    Comment: (NOTE) The Xpert Xpress SARS-CoV-2/FLU/RSV plus assay is intended as an aid in the diagnosis of influenza from Nasopharyngeal swab specimens and should not be used as a sole basis for treatment. Nasal washings and aspirates are unacceptable for Xpert Xpress SARS-CoV-2/FLU/RSV testing.  Fact Sheet for Patients: EntrepreneurPulse.com.au  Fact Sheet for Healthcare Providers: IncredibleEmployment.be  This test is not yet approved or cleared by the [place] FDA and has been authorized for detection and/or diagnosis of SARS-CoV-2 by FDA under  an Emergency Use Authorization (EUA). This EUA will remain in effect (meaning this test can be used) for the duration of the COVID-19 declaration under Section 564(b)(1) of the Act, 21 U.S.C. section 360bbb-3(b)(1), unless the authorization is terminated or revoked.  Performed at [facility], [address] [place], [place] [address]           [facility] If 7PM-7AM, please contact night-coverage at [email or web address], Office  [phone]   07/17/2020, 1:33 PM  LOS: 2 days

## 2020-07-17 NOTE — Discharge Instructions (Addendum)
Wound care  Current dressing can come off in two days. Afterwards, Keep area covered with bandaid changed daily until seen in office     1. indeterminate small lesion involving the lower pole of the right kidney was noted  on CT abdomen pelvis obtained yesterday. Recommend to obtain reading MRI or CT with and without contrast as outpatient

## 2020-07-17 NOTE — Progress Notes (Signed)
CTA of head and neck:  1. Focal tortuosity of the left cervical ICA with narrowing of less than 50%. 2. Mild tortuosity of the cervical internal carotid arteries bilaterally without other significant stenosis. 3. Mild atherosclerotic changes at the carotid bifurcations bilaterally without significant stenosis. 4. Normal variant CTA Circle of Willis without significant proximal stenosis, aneurysm, or branch vessel occlusion. 5. Multilevel spondylosis of the cervical spine as described.  TTE: Completed with report pending.   A/R: 80 year old female with episode of syncope at home. CT and MRI reveal interval progression of chronic microvascular ischemia since 2019. - CTA findings as above. No evidence for vertebrobasilar insufficiency - TTE pending.  - Continue Plavix.  - Syncope workup per primary team.   Electronically signed: Dr. Kerney Elbe

## 2020-07-17 NOTE — Progress Notes (Addendum)
OT Cancellation Note  Patient Details Name: Kaylee [name] MRN: [id number] DOB: [birth year]   Cancelled Treatment:    Reason Eval/Treat Not Completed: Patient declined, no reason specified. Consult received, chart reviewed. Pt eating breakfast, requests OT come back after. Will re-attempt at later time.   Addendum, 10:49am: 2nd attempt, pt with echo. Will re-attempt.  Hanley Hays, MPH, MS, OTR/L ascom [phone] 07/17/20, 9:53 AM

## 2020-07-17 NOTE — Progress Notes (Signed)
SLP Cancellation Note  Patient Details Name: Kaylee [name] MRN: [id number] DOB: 09/21/1940   Cancelled treatment:       Reason Eval/Treat Not Completed: SLP screened, no needs identified, will sign off (chart reviewed; consulted NSG then met w/ pt/family). Pt needed assistance to the bathroom; she uses the walker w/ standby support per report. Family present in room agreed, and pt has been getting up this morning the same. Pt was verbal indicating her needs. Lunch had recently arrived.  Pt denied any difficulty swallowing and is currently on a regular diet; tolerates swallowing pills w/ water per NSG. Pt conversed at conversational level w/out deficits noted; pt and family denied any speech-language deficits. Patient repored that she was tired from family stressors prior to admit. Per Neurology, pt is A&O x4. Naming and repetition intact. Follows multi-step commands. Speech fluid.  MRI revealed: "No acute intracranial abnormality. 2. 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  CXR: no acute abnormalities.  No further skilled ST services indicated as pt appears at her baseline. Pt/family agreed. NSG to reconsult if any change in status while admitted.      Orinda Kenner, MS, CCC-SLP Speech Language Pathologist Rehab Services [phone] [facility] 07/17/2020, 2:31 PM

## 2020-07-17 NOTE — Progress Notes (Signed)
Physical Therapy Treatment Patient Details Name: Kaylee [name] MRN: [id number] DOB: Jun 09, 1940 Today's Date: 07/17/2020    History of Present Illness Kaylee [name] is an 45yoF who [facility] acute onset AMS and syncope. Pt hypotensive with EMS adn in ED. PMH: syncope, HLD, HTN, migraine HA, CVA, UTI, hypoTSH. 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 Pt has (+) orthostatic hypotension c NSG on 4/3.    PT Comments    Pt is making good progress towards goals with ability to ambulate around RN station with safe technique using RW. Pt appears to be close to baseline level. Assessment of strength equal bilateral. Will continue to progress as able.   Follow Up Recommendations  Home health PT     Equipment Recommendations  None recommended by PT    Recommendations for Other Services       Precautions / Restrictions Precautions Precautions: Fall Restrictions Weight Bearing Restrictions: No    Mobility  Bed Mobility Overal bed mobility: Needs Assistance Bed Mobility: Supine to Sit     Supine to sit: Supervision     General bed mobility comments: bed mobility performed with safe technique. Once seated, upright posture noted    Transfers Overall transfer level: Needs assistance Equipment used: Rolling walker (2 wheeled) Transfers: Sit to/from Stand Sit to Stand: Supervision         General transfer comment: safe technique with upright posture. RW used  Ambulation/Gait Ambulation/Gait assistance: Supervision Gait Distance (Feet): 200 Feet Assistive device: Rolling walker (2 wheeled) Gait Pattern/deviations: Step-through pattern     General Gait Details: ambulation performed in hallway. Reciprocal gait pattern use with safe technique.   Stairs             Wheelchair Mobility    Modified Rankin (Stroke Patients Only)       Balance Overall balance assessment:  Modified Independent                                          Cognition Arousal/Alertness: Awake/alert Behavior During Therapy: WFL for tasks assessed/performed Overall Cognitive Status: Within Functional Limits for tasks assessed                                        Exercises      General Comments        Pertinent Vitals/Pain Pain Assessment: No/denies pain    Home Living                      Prior Function            PT Goals (current goals can now be found in the care plan section) Acute Rehab PT Goals Patient Stated Goal: return to baseline mobiilty, avoid furture syncopal episodes PT Goal Formulation: With patient Time For Goal Achievement: 07/30/20 Potential to Achieve Goals: Fair Progress towards PT goals: Progressing toward goals    Frequency    Min 2X/week      PT Plan Current plan remains appropriate    Co-evaluation              AM-PAC PT "6 Clicks" Mobility   Outcome Measure  Help needed turning from your back to your side while  in a flat bed without using bedrails?: None Help needed moving from lying on your back to sitting on the side of a flat bed without using bedrails?: None Help needed moving to and from a bed to a chair (including a wheelchair)?: None Help needed standing up from a chair using your arms (e.g., wheelchair or bedside chair)?: None Help needed to walk in hospital room?: A Little Help needed climbing 3-5 steps with a railing? : A Little 6 Click Score: 22    End of Session Equipment Utilized During Treatment: Gait belt Activity Tolerance: Patient tolerated treatment well;No increased pain Patient left: in bed Nurse Communication: Mobility status PT Visit Diagnosis: Other symptoms and signs involving the nervous system (V35.521)     Time: 7471-5953 PT Time Calculation (min) (ACUTE ONLY): 13 min  Charges:  $Gait Training: 8-22 mins                     Greggory Stallion,  PT, DPT [phone]    Terrall Bley 07/17/2020, 4:16 PM

## 2020-07-17 NOTE — Progress Notes (Signed)
*  PRELIMINARY RESULTS* Echocardiogram 2D Echocardiogram has been performed.  Kaylee [name] Kaylee [name] 07/17/2020, 11:32 AM

## 2020-07-17 NOTE — TOC Progression Note (Signed)
Transition of Care [facility]) - Progression Note    Patient Details  Name: [name] MRN: [id number] Date of Birth: [birth year]  [facility] of [facility] CM/SW Contact  Shelbie Hutching, RN Phone Number: 07/17/2020, 4:20 PM  Clinical Narrative:    Patient does not currently have a PCP.  Patient's son wanted to get set up with Jefm Bryant but Jefm Bryant is booked out until June.  Son agrees to [employer] and patient able to get an appointment with Georgian Co PA Tuesday April 12th at 1030.  Appointment added to DC summary.    Expected Discharge Plan: [place] Barriers to Discharge: Barriers Resolved  Expected Discharge Plan and Services Expected Discharge Plan: [place]   Discharge Planning Services: CM Consult   Living arrangements for the past 2 months: Single Family Home Expected Discharge Date: 07/17/20               DME Arranged: N/A DME Agency: NA       HH Arranged: PT,OT [place] Agency: [place] ([facility]) Date HH Agency Contacted: 07/17/20 Time [place]: [id number] Representative spoke with at [place]: [place] ([place]) Interventions    Readmission Risk Interventions No flowsheet data found.

## 2020-07-17 NOTE — Discharge Summary (Addendum)
Physician Discharge Summary  Kaylee [name] YFV:[id number] DOB: Aug 06, 1940 DOA: 07/15/2020  PCP: Pcp, No  Admit date: 07/15/2020 Discharge date: 07/17/2020  Time spent: 60  minutes  Recommendations for Outpatient Follow-up:  1. Follow-up with general surgery in 1 week  Discharge Diagnoses:   Principal problem Syncope Chest wall mass Active Problems:   CVA (cerebral vascular accident) [facility])   Essential hypertension   Hypothyroidism   Hyperlipidemia   NSTEMI (non-ST elevated myocardial infarction) [facility])   Discharge Condition: Stable  Diet recommendation: Heart healthy diet  Filed Weights   07/15/20 1211 07/16/20 0347  Weight: 66 kg 75.4 kg    History of present illness:  80 year old female with a history of CVA in 2019, hypertension, hyperlipidemia, hypothyroidism who presented with syncope from home.  Patient was having brunch with her brothers and one of her sisters when she suddenly became unresponsive and had syncopal episode.  There was no shaking activity noted, no loss of bladder or bowel control.  She was found to be hypotensive when EMS arrived, her systolic blood pressure was in the 90s. Initial imaging with chest x-ray showed concern for pneumoperitoneum below the diaphragm with no acute chest pathology.  CT abdomen/pelvis was done which did not show pneumoperitoneum or perforated viscus.  She also was found to have a sternal lesion and ultrasound was ordered as per general surgery recommendation. CT head without contrast showed changes concerning for acute or subacute infarcts in the anterior right frontal lobe and high bilateral posterior frontal lobes.  Neuro exam was normal.  MRI brain was ordered.  Hospital Course:  1. Syncope-likely vasovagal, she had prodromal symptoms before with rapid resolution of syncope.  Continue telemetry monitoring.  She has positive orthostatic hypotension.    Blood pressure has improved.  Echocardiogram done, result is currently pending.   Patient son wants to take her home.  Did follow-up with general surgery in 1 week and he can follow-up with echocardiogram results. 2. History of CVA-CT head was concerning for new areas of ischemia acute versus subacute infarct.  MRI brain obtained this morning shows no acute intracranial pathology.  Neurology was consulted and has seen the patient.  Aspirin switched to Plavix.   CTA of head and neck showed no evidence of vertebrobasilar insufficiency.  Plavix can be restarted from 07/18/2020. 3. Lactic acidosis-likely from hypotension.  Lactic acid is 3.0.  No signs of sepsis.  No infectious process suspected. 4. 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    Patient underwent biopsy of the mass.  Biopsy results will be followed by general surgery in 1 week. 5. Right kidney lesion-indeterminate small lesion involving the lower pole of the right kidney was noted with differential including focal inflammation, complex cyst versus solid mass on CT abdomen pelvis obtained yesterday.  Recommend to obtain reading MRI or CT with and without contrast as outpatient.  Procedures:  Skin biopsy  Consultations:  General surgery  Discharge Exam: Vitals:   07/17/20 0843 07/17/20 1230  BP: 139/82 (!) 152/70  Pulse: 64 69  Resp: 20 15  Temp: 98.6 F (37 C) 98.7 F (37.1 C)  SpO2: 99% 100%    General: Appears in no acute distress Cardiovascular: S1-S2, regular Respiratory: Clear to auscultation bilaterally  Discharge Instructions   Discharge Instructions    Diet - low sodium heart healthy   Complete by: As directed    Discharge instructions   Complete by: As directed    Follow with PCP  for Echocardiogram result, skin biopsy result in one week   Increase activity slowly   Complete by: As directed      Allergies as of 07/17/2020   No Known Allergies     Medication List    STOP taking these medications   aspirin 81 MG EC tablet   atorvastatin 40  MG tablet Commonly known as: LIPITOR     TAKE these medications   clopidogrel 75 MG tablet Commonly known as: Plavix Take 1 tablet (75 mg total) by mouth daily. Start taking on: July 18, 2020   multivitamin with minerals Tabs tablet Take 1 tablet by mouth daily.   Vitamin D3 50 MCG (2000 UT) capsule Take 1 capsule (2,000 Units total) by mouth daily.      No Known Allergies    The results of significant diagnostics from this hospitalization (including imaging, microbiology, ancillary and laboratory) are listed below for reference.    Significant Diagnostic Studies: CT ANGIO HEAD W OR WO CONTRAST  Result Date: 07/16/2020 CLINICAL DATA:  Dizziness, persistent/recurrent, cardiac or vascular cause suspected. Stroke/TIA. Assess intracranial arteries. Progressive diffuse white matter disease. EXAM: CT ANGIOGRAPHY HEAD AND NECK TECHNIQUE: Multidetector CT imaging of the head and neck was performed using the standard protocol during bolus administration of intravenous contrast. Multiplanar CT image reconstructions and MIPs were obtained to evaluate the vascular anatomy. Carotid stenosis measurements (when applicable) are obtained utilizing NASCET criteria, using the distal internal carotid diameter as the denominator. CONTRAST:  39mL OMNIPAQUE IOHEXOL 350 MG/ML SOLN COMPARISON:  MR head without contrast 07/16/2020. CT head without contrast 4/2/2. FINDINGS: CT HEAD FINDINGS Brain: Extensive periventricular and subcortical white matter disease is present bilaterally. Remote bilateral frontal lobe infarcts present. Medial remote parietal lobe infarcts noted bilateral. No acute infarct or hemorrhage is present. The ventricles are proportionate to the degree of atrophy. No significant extraaxial fluid collection is present. Remote lacunar infarct present in the left cerebellum. Brainstem and cerebellum are otherwise within normal limits. Vascular: Atherosclerotic calcifications are present within the  cavernous internal carotid arteries bilaterally. No hyperdense vessel is present. Skull: Calvarium is intact. No focal lytic or blastic lesions are present. No significant extracranial soft tissue lesion is present. Sinuses: The paranasal sinuses and mastoid air cells are clear. Orbits: The globes and orbits are within normal limits. Review of the MIP images confirms the above findings CTA NECK FINDINGS Aortic arch: A 3 vessel arch configuration is present. No significant vascular calcifications are present. Right carotid system: Mild tortuosity is present cervical right ICA without significant stenosis. Minimal atherosclerotic irregularity is present at the carotid bifurcation. No significant stenosis is present. The cervical right ICA is within normal limits. Left carotid system: Mild tortuosity is present in the left common carotid artery without significant stenosis. Mild atherosclerotic changes are noted at the proximal left ICA. Focal tortuosity is present in the cervical left ICA with mild narrowing of less than 50%. The more distal cervical left ICA is normal. Vertebral arteries: The left vertebral artery is slightly dominant to the right. Both vertebral arteries originate from the subclavian arteries without significant stenosis. There is no significant stenosis of either vertebral artery in the neck. Skeleton: Slight degenerative anterolisthesis present at C3-4 and C4-5. Chronic loss of disc height noted at C5-6 and C6-7. Uncovertebral spurring contributes to foraminal narrowing worse on the left at C5-6 and C6-7 and on the right at C4-5 and C5-6. No focal lytic or blastic lesions are present. Other neck: The soft tissues the neck  are otherwise unremarkable. No focal mucosal lesions are present. No significant adenopathy is present. Thyroid is normal. Salivary glands are within normal limits. Upper chest: The lung apices are clear. Thoracic inlet is within normal limits. Review of the MIP images confirms  the above findings CTA HEAD FINDINGS Anterior circulation: The internal carotid arteries are within normal limits from the skull base through the ICA termini. The A1 and M1 segments are normal. Anterior communicating artery is patent. MCA bifurcations are within normal limits. The ACA and MCA branch vessels are normal. Posterior circulation: Left vertebral artery is the dominant vessel. PICA origins are visualized and normal. The vertebrobasilar junction is normal. Basilar artery is within normal limits. Both posterior cerebral arteries originate from basilar tip. PCA branch vessels are normal. Venous sinuses: Dural sinuses are patent. Straight sinus deep cerebral veins are within normal limits. Right transverse sinus is dominant. Straight sinus deep and cerebral veins are intact. Cortical veins are unremarkable. Anatomic variants: None Review of the MIP images confirms the above findings IMPRESSION: 1. Focal tortuosity of the left cervical ICA with narrowing of less than 50%. 2. Mild tortuosity of the cervical internal carotid arteries bilaterally without other significant stenosis. 3. Mild atherosclerotic changes at the carotid bifurcations bilaterally without significant stenosis. 4. Normal variant CTA Circle of Willis without significant proximal stenosis, aneurysm, or branch vessel occlusion. 5. Multilevel spondylosis of the cervical spine as described. Electronically Signed   By: San Morelle M.D.   On: 07/16/2020 15:06   CT Head Wo Contrast  Result Date: 07/15/2020 CLINICAL DATA:  Mental status change. EXAM: CT HEAD WITHOUT CONTRAST TECHNIQUE: Contiguous axial images were obtained from the base of the skull through the vertex without intravenous contrast. COMPARISON:  CT 02/05/2018. MRI 02/06/2018 FINDINGS: Brain: New area of hypoattenuation and loss of gray-white differentiation in the right frontal lobe. Faint linear hyperdensity along the superior/anterior aspect (series 2, image 21). Additional  small focal areas of loss of gray-white differentiation in the high bilateral posterior frontal lobes (series 2, image 25 and series 4, image 49; series 2 image 26 and series 4 image 53) also concerning for acute or subacute infarcts. No substantial mass effect. Similar encephalomalacia in the left frontal lobe. Similar remote left cerebellar lacunar infarct. 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 Otherwise, no mass lesion. No midline shift. No mass occupying hemorrhage. Additional patchy white matter hypoattenuation, nonspecific but most likely related to chronic microvascular ischemic disease. Mild generalized cerebral volume loss with ex vacuo ventricular dilation. No hydrocephalus. Vascular: No hyperdense vessel identified. Skull: No acute fracture. Sinuses/Orbits: No acute findings. Other: No mastoid effusions. IMPRESSION: Multiple new areas of hypoattenuation and loss of gray-white differentiation in the anterior right frontal lobe and high bilateral posterior frontal lobes, concerning for acute or subacute infarcts. Faint linear hyperdensity anteriorly in the right frontal lobe may represent trace petechial hemorrhage. No substantial mass effect. Recommend MRI to further evaluate. Electronically Signed   By: Margaretha Sheffield MD   On: 07/15/2020 13:40   CT ANGIO NECK W OR WO CONTRAST  Result Date: 07/16/2020 CLINICAL DATA:  Dizziness, persistent/recurrent, cardiac or vascular cause suspected. Stroke/TIA. Assess intracranial arteries. Progressive diffuse white matter disease. EXAM: CT ANGIOGRAPHY HEAD AND NECK TECHNIQUE: Multidetector CT imaging of the head and neck was performed using the standard protocol during bolus administration of intravenous contrast. Multiplanar CT image reconstructions and MIPs were obtained to evaluate the vascular anatomy. Carotid stenosis measurements (when applicable) are obtained  utilizing NASCET criteria, using  the distal internal carotid diameter as the denominator. CONTRAST:  84mL OMNIPAQUE IOHEXOL 350 MG/ML SOLN COMPARISON:  MR head without contrast 07/16/2020. CT head without contrast 4/2/2. FINDINGS: CT HEAD FINDINGS Brain: Extensive periventricular and subcortical white matter disease is present bilaterally. Remote bilateral frontal lobe infarcts present. Medial remote parietal lobe infarcts noted bilateral. No acute infarct or hemorrhage is present. The ventricles are proportionate to the degree of atrophy. No significant extraaxial fluid collection is present. Remote lacunar infarct present in the left cerebellum. Brainstem and cerebellum are otherwise within normal limits. Vascular: Atherosclerotic calcifications are present within the cavernous internal carotid arteries bilaterally. No hyperdense vessel is present. Skull: Calvarium is intact. No focal lytic or blastic lesions are present. No significant extracranial soft tissue lesion is present. Sinuses: The paranasal sinuses and mastoid air cells are clear. Orbits: The globes and orbits are within normal limits. Review of the MIP images confirms the above findings CTA NECK FINDINGS Aortic arch: A 3 vessel arch configuration is present. No significant vascular calcifications are present. Right carotid system: Mild tortuosity is present cervical right ICA without significant stenosis. Minimal atherosclerotic irregularity is present at the carotid bifurcation. No significant stenosis is present. The cervical right ICA is within normal limits. Left carotid system: Mild tortuosity is present in the left common carotid artery without significant stenosis. Mild atherosclerotic changes are noted at the proximal left ICA. Focal tortuosity is present in the cervical left ICA with mild narrowing of less than 50%. The more distal cervical left ICA is normal. Vertebral arteries: The left vertebral artery is slightly dominant to the right. Both vertebral arteries originate  from the subclavian arteries without significant stenosis. There is no significant stenosis of either vertebral artery in the neck. Skeleton: Slight degenerative anterolisthesis present at C3-4 and C4-5. Chronic loss of disc height noted at C5-6 and C6-7. Uncovertebral spurring contributes to foraminal narrowing worse on the left at C5-6 and C6-7 and on the right at C4-5 and C5-6. No focal lytic or blastic lesions are present. Other neck: The soft tissues the neck are otherwise unremarkable. No focal mucosal lesions are present. No significant adenopathy is present. Thyroid is normal. Salivary glands are within normal limits. Upper chest: The lung apices are clear. Thoracic inlet is within normal limits. Review of the MIP images confirms the above findings CTA HEAD FINDINGS Anterior circulation: The internal carotid arteries are within normal limits from the skull base through the ICA termini. The A1 and M1 segments are normal. Anterior communicating artery is patent. MCA bifurcations are within normal limits. The ACA and MCA branch vessels are normal. Posterior circulation: Left vertebral artery is the dominant vessel. PICA origins are visualized and normal. The vertebrobasilar junction is normal. Basilar artery is within normal limits. Both posterior cerebral arteries originate from basilar tip. PCA branch vessels are normal. Venous sinuses: Dural sinuses are patent. Straight sinus deep cerebral veins are within normal limits. Right transverse sinus is dominant. Straight sinus deep and cerebral veins are intact. Cortical veins are unremarkable. Anatomic variants: None Review of the MIP images confirms the above findings IMPRESSION: 1. Focal tortuosity of the left cervical ICA with narrowing of less than 50%. 2. Mild tortuosity of the cervical internal carotid arteries bilaterally without other significant stenosis. 3. Mild atherosclerotic changes at the carotid bifurcations bilaterally without significant  stenosis. 4. Normal variant CTA Circle of Willis without significant proximal stenosis, aneurysm, or branch vessel occlusion. 5. Multilevel spondylosis of the cervical spine  as described. Electronically Signed   By: San Morelle M.D.   On: 07/16/2020 15:06   MR BRAIN WO CONTRAST  Result Date: 07/16/2020 CLINICAL DATA:  80 year old female with unexplained altered mental status. Plain head CT suspicious for acute to subacute frontal lobe infarcts. EXAM: MRI HEAD WITHOUT CONTRAST TECHNIQUE: Multiplanar, multiecho pulse sequences of the brain and surrounding structures were obtained without intravenous contrast. COMPARISON:  Head CT yesterday.  Brain MRI 02/06/2018. FINDINGS: Brain: No restricted diffusion to suggest acute infarction. No midline shift, mass effect, evidence of mass lesion, ventriculomegaly, extra-axial collection or acute intracranial hemorrhage. Cervicomedullary junction and pituitary are within normal limits. Multifocal chronic bilateral MCA territory cortical and subcortical white matter encephalomalacia corresponds to the hypodense areas seen on CT yesterday, and most of these are new from the July 19, 2017 exams. Associated hemosiderin in both hemispheres, also new since 2017-07-19 and most pronounced in the left parietal lobe (series 13, image 40 on SWI today). Scattered additional cerebral white matter T2 and FLAIR hyperintensity has also progressed. The deep gray matter nuclei and brainstem remain within normal limits. Small chronic cerebellar infarcts are stable since Jul 19, 2017 and more pronounced on the left. Vascular: Major intracranial vascular flow voids are stable since July 19, 2017 with intracranial artery tortuosity. Skull and upper cervical spine: Degenerative ligamentous hypertrophy about the odontoid is chronic. Visualized bone marrow signal is within normal limits. Sinuses/Orbits: Negative orbits. Mild paranasal sinus mucosal thickening is new since 07-19-2017. No sinus fluid levels. Other: Mastoids remain  clear. Negative visible orbit and scalp soft tissues. IMPRESSION: 1. No acute intracranial abnormality. 2. Substantially progressed chronic ischemic disease in the bilateral MCA territories since 2017/07/19, corresponding to the abnormal CT hypodensity yesterday, with associated chronic blood products. Electronically Signed   By: Genevie Ann M.D.   On: 07/16/2020 11:10   CT ABDOMEN PELVIS W CONTRAST  Result Date: 07/15/2020 CLINICAL DATA:  Altered mental status with syncopal episode and hypotension. Pneumoperitoneum on chest radiographs. EXAM: CT ABDOMEN AND PELVIS WITH CONTRAST TECHNIQUE: Multidetector CT imaging of the abdomen and pelvis was performed using the standard protocol following bolus administration of intravenous contrast. CONTRAST:  159mL OMNIPAQUE IOHEXOL 300 MG/ML  SOLN COMPARISON:  Chest radiographs same date. Abdominopelvic CT 11/08/2012. FINDINGS: Lower chest: Clear lung bases. No significant pleural or pericardial effusion. No basilar pneumothorax. Mild aortic atherosclerosis. Hepatobiliary: The liver is normal in density without suspicious focal abnormality. No evidence of gallstones, gallbladder wall thickening or biliary dilatation. Pancreas: Unremarkable. No pancreatic ductal dilatation or surrounding inflammatory changes. Spleen: Normal in size without focal abnormality. Adrenals/Urinary Tract: Both adrenal glands appear normal. There is a small nonobstructing calculus in the lower pole of the left kidney. There are small bilateral renal cysts which have mildly enlarged compared with the previous study. Renal sinus cysts are present bilaterally without hydronephrosis or delayed contrast excretion. In the lower pole of the right kidney, there is an ill-defined low-density lesion measuring 1.6 x 1.5 cm on image 37/2. This is an indeterminate finding which could reflect a complex cyst, small mass or focal inflammation. The bladder is decompressed by a Foley catheter. Stomach/Bowel: There is a large  amount of ingested material within the stomach. There is moderate stool throughout the colon. No bowel wall thickening, significant distention or surrounding inflammation. The appendix appears normal. Vascular/Lymphatic: There are no enlarged abdominal or pelvic lymph nodes. No acute vascular findings. Mild aortic and branch vessel atherosclerosis. The portal, superior mesenteric and splenic veins are patent. Reproductive: Stable globular appearance of the uterus  suspicious for small fibroids. No adnexal mass. Other: There is no free intraperitoneal air. Suggested pneumoperitoneum on prior chest radiographs is attributed to gas in the colon and stomach. There is colonic interposition between the liver and right hemidiaphragm. No ascites or focal extraluminal fluid collection. Musculoskeletal: No acute or significant osseous findings. There are degenerative changes throughout the lumbar spine. There is a chronic Schmorl's node involving the inferior endplate of L1 which appears grossly stable. IMPRESSION: 1. No evidence of pneumoperitoneum or perforated abdominal viscus. The suggested abnormality on prior chest radiograph is due to luminal gas in the stomach and colon. 2. Indeterminate small lesion involving the lower pole of the right kidney with differential considerations including focal inflammation, complex cyst and solid mass. Correlation with urine analysis recommended. Recommend follow-up dedicated renal MRI or CT with and without contrast. 3. No other acute findings. Prominent ingested material in the stomach and prominent stool throughout the colon. 4. Nonobstructing left renal calculus. 5. Mild Aortic Atherosclerosis (ICD10-I70.0). Electronically Signed   By: Richardean Sale M.D.   On: 07/15/2020 14:33   DG Chest Portable 1 View  Result Date: 07/15/2020 CLINICAL DATA:  Altered mental status and syncopal episode. Hypotension. EXAM: PORTABLE CHEST 1 VIEW COMPARISON:  None. FINDINGS: The heart size and  mediastinal contours are normal aside from mild aortic tortuosity. The lungs appear clear. There is no pleural effusion or pneumothorax. Air is noted beneath the diaphragm bilaterally consistent with pneumoperitoneum. There is a probable old fracture of the left 5th rib anteriorly. No acute osseous findings are evident. Telemetry leads and a loop recorder overlie the chest. IMPRESSION: 1. Pneumoperitoneum implying a ruptured abdominal viscus in the absence of recent surgery or penetrating injury. Surgical consultation recommended. Abdominopelvic CT may be helpful for further evaluation. 2. No acute chest findings. 3. Critical Value/emergent results were called by telephone at the time of interpretation on 07/15/2020 at 12:43 pm to provider [facility] , who verbally acknowledged these results. Electronically Signed   By: Richardean Sale M.D.   On: 07/15/2020 12:45   CUP PACEART REMOTE DEVICE CHECK  Result Date: 07/08/2020 ILR summary report received. Battery status OK. Normal device function. No new symptom, tachy, brady, or pause episodes. No new AF episodes. Monthly summary reports and ROV/PRN.  R. Powers, CVRS  [place] CHEST SOFT TISSUE  Result Date: 07/15/2020 CLINICAL DATA:  Midline open sternal wound to the chest. Started after a fall and progressed over a year. EXAM: ULTRASOUND OF CHEST SOFT TISSUES TECHNIQUE: Ultrasound examination of the chest wall soft tissues was performed in the area of clinical concern. COMPARISON:  None. FINDINGS: Ultrasound images of the sternal wound demonstrate a circumscribed heterogeneous hypoechoic region extending from the skin surface to the anterior sternum and measuring 3 x 3 x 2.6 cm. Patchy flow is demonstrated within the lesion on color flow Doppler imaging. Appearance suggest a mass lesion. Given the chronic nature of the lesion, it likely represents a infiltrating soft tissue mass. Alternatively, chronic osteomyelitis with abscess and cellulitis would be a  possibility. IMPRESSION: Circumscribed heterogeneous hypoechoic mass in the sternal wound may represent an infiltrating soft tissue mass, or less likely chronic osteomyelitis with abscess and cellulitis. Electronically Signed   By: Lucienne Capers M.D.   On: 07/15/2020 22:11    Microbiology: Recent Results (from the past 240 hour(s))  Resp Panel by RT-PCR (Flu A&B, Covid) Nasopharyngeal Swab     Status: None   Collection Time: 07/15/20 12:21 PM   Specimen: Nasopharyngeal Swab; Nasopharyngeal(NP)  swabs in vial transport medium  Result Value Ref Range Status   SARS Coronavirus 2 by RT PCR NEGATIVE NEGATIVE Final    Comment: (NOTE) SARS-CoV-2 target nucleic acids are NOT DETECTED.  The SARS-CoV-2 RNA is generally detectable in upper respiratory specimens during the acute phase of infection. The lowest concentration of SARS-CoV-2 viral copies this assay can detect is 138 copies/mL. A negative result does not preclude SARS-Cov-2 infection and should not be used as the sole basis for treatment or other patient management decisions. A negative result may occur with  improper specimen collection/handling, submission of specimen other than nasopharyngeal swab, presence of viral mutation(s) within the areas targeted by this assay, and inadequate number of viral copies(<138 copies/mL). A negative result must be combined with clinical observations, patient history, and epidemiological information. The expected result is Negative.  Fact Sheet for Patients:  EntrepreneurPulse.com.au  Fact Sheet for Healthcare Providers:  IncredibleEmployment.be  This test is no t yet approved or cleared by the [place] FDA and  has been authorized for detection and/or diagnosis of SARS-CoV-2 by FDA under an Emergency Use Authorization (EUA). This EUA will remain  in effect (meaning this test can be used) for the duration of the COVID-19 declaration under Section  564(b)(1) of the Act, 21 U.S.C.section 360bbb-3(b)(1), unless the authorization is terminated  or revoked sooner.       Influenza A by PCR NEGATIVE NEGATIVE Final   Influenza B by PCR NEGATIVE NEGATIVE Final    Comment: (NOTE) The Xpert Xpress SARS-CoV-2/FLU/RSV plus assay is intended as an aid in the diagnosis of influenza from Nasopharyngeal swab specimens and should not be used as a sole basis for treatment. Nasal washings and aspirates are unacceptable for Xpert Xpress SARS-CoV-2/FLU/RSV testing.  Fact Sheet for Patients: EntrepreneurPulse.com.au  Fact Sheet for Healthcare Providers: IncredibleEmployment.be  This test is not yet approved or cleared by the [place] FDA and has been authorized for detection and/or diagnosis of SARS-CoV-2 by FDA under an Emergency Use Authorization (EUA). This EUA will remain in effect (meaning this test can be used) for the duration of the COVID-19 declaration under Section 564(b)(1) of the Act, 21 U.S.C. section 360bbb-3(b)(1), unless the authorization is terminated or revoked.  Performed at [facility], [address] [place], [place] [address]      Labs: Basic Metabolic Panel: Recent Labs  Lab 07/15/20 1221 07/17/20 0458  NA 142 141  K 3.8 3.7  CL 110 111  CO2 25 26  GLUCOSE 155* 87  BUN 19 11  CREATININE 1.08* 0.71  CALCIUM 8.7* 8.9   Liver Function Tests: Recent Labs  Lab 07/15/20 1221 07/17/20 0458  AST 29 23  ALT 12 14  ALKPHOS 67 64  BILITOT 0.8 0.7  PROT 6.3* 5.9*  ALBUMIN 3.1* 3.0*   No results for input(s): LIPASE, AMYLASE in the last 168 hours. No results for input(s): AMMONIA in the last 168 hours. CBC: Recent Labs  Lab 07/15/20 1221 07/17/20 0458  WBC 8.4 7.8  NEUTROABS 4.3  --   HGB 12.7 11.9*  HCT 39.4 36.2  MCV 97.8 94.8  PLT 135* 147*   Cardiac Enzymes: No results for input(s): CKTOTAL, CKMB, CKMBINDEX, TROPONINI in the last 168  hours. BNP: BNP (last 3 results) No results for input(s): BNP in the last 8760 hours.  ProBNP (last 3 results) No results for input(s): PROBNP in the last 8760 hours.  CBG: Recent Labs  Lab 07/15/20 1227  GLUCAP 133*  Signed:  Oswald Hillock MD.  [facility] 07/17/2020, 3:51 PM

## 2020-07-17 NOTE — TOC Transition Note (Signed)
Transition of Care [facility]) - CM/SW Discharge Note   Patient Details  Name: Kaylee [name] MRN: [id number] Date of Birth: 1940-12-13  Transition of [facility] CM/SW Contact:  Shelbie Hutching, RN Phone Number: 07/17/2020, 3:43 PM   Clinical Narrative:    Patient is medically cleared for discharge home with home health services.  Corene Cornea with [facility] is aware of discharge today.  Home Health orders are in.     Final next level of care: Home w Home Health Services Barriers to Discharge: Barriers Resolved   Patient Goals and CMS Choice   CMS Medicare.gov Compare Post Acute Care list provided to:: Patient Choice offered to / list presented to : Patient  Discharge Placement                       Discharge Plan and Services   Discharge Planning Services: CM Consult            DME Arranged: N/A DME Agency: NA       HH Arranged: PT,OT [place] Agency: [place] ([place]) Date [place]: 07/17/20 Time [place]: 0340 Representative spoke with at [place]: [place] ([place]) Interventions     Readmission Risk Interventions No flowsheet data found.

## 2020-07-17 NOTE — Progress Notes (Signed)
Subjective:  CC: Kaylee [name] is a 80 y.o. female  Hospital stay day 2,     HPI: No acute issues overnight.  ROS:  General: Denies weight loss, weight gain, fatigue, fevers, chills, and night sweats. Heart: Denies chest pain, palpitations, racing heart, irregular heartbeat, leg pain or swelling, and decreased activity tolerance. Respiratory: Denies breathing difficulty, shortness of breath, wheezing, cough, and sputum. GI: Denies change in appetite, heartburn, nausea, vomiting, constipation, diarrhea, and blood in stool. GU: Denies difficulty urinating, pain with urinating, urgency, frequency, blood in urine.   Objective:   Temp:  [98 F (36.7 C)-98.8 F (37.1 C)] 98.7 F (37.1 C) (04/04 1230) Pulse Rate:  [64-85] 69 (04/04 1230) Resp:  [14-20] 15 (04/04 1230) BP: (139-183)/(70-91) 152/70 (04/04 1230) SpO2:  [96 %-100 %] 100 % (04/04 1230)     Height: 5\' 4"  (162.6 cm) Weight: 75.4 kg BMI (Calculated): 28.52   Intake/Output this shift:   Intake/Output Summary (Last 24 hours) at 07/17/2020 1426 Last data filed at 07/16/2020 1948 Gross per 24 hour  Intake 150 ml  Output --  Net 150 ml    Constitutional :  alert, cooperative, appears stated age and no distress  Respiratory:  clear to auscultation bilaterally  Cardiovascular:  regular rate and rhythm  Skin: Cool and moist. Firm mass still present on chest wall. Unchanged from previous exam.  Psychiatric: Normal affect, non-agitated, not confused       LABS:  CMP Latest Ref Rng & Units 07/17/2020 07/15/2020 02/05/2018  Glucose 70 - 99 mg/dL 87 155(H) 146(H)  BUN 8 - 23 mg/dL 11 19 19   Creatinine 0.44 - 1.00 mg/dL 0.71 1.08(H) 1.00  Sodium 135 - 145 mmol/L 141 142 139  Potassium 3.5 - 5.1 mmol/L 3.7 3.8 4.0  Chloride 98 - 111 mmol/L 111 110 105  CO2 22 - 32 mmol/L 26 25 26   Calcium 8.9 - 10.3 mg/dL 8.9 8.7(L) 9.3  Total Protein 6.5 - 8.1 g/dL 5.9(L) 6.3(L) -  Total Bilirubin 0.3 - 1.2 mg/dL 0.7 0.8 -  Alkaline Phos 38 -  126 U/L 64 67 -  AST 15 - 41 U/L 23 29 -  ALT 0 - 44 U/L 14 12 -   CBC Latest Ref Rng & Units 07/17/2020 07/15/2020 03/23/2018  WBC 4.0 - 10.5 K/uL 7.8 8.4 7.6  Hemoglobin 12.0 - 15.0 g/dL 11.9(L) 12.7 13.5  Hematocrit 36.0 - 46.0 % 36.2 39.4 41.1  Platelets 150 - 400 K/uL 147(L) 135(L) 153.0    RADS: CLINICAL DATA:  Midline open sternal wound to the chest. Started after a fall and progressed over a year.  EXAM: ULTRASOUND OF CHEST SOFT TISSUES  TECHNIQUE: Ultrasound examination of the chest wall soft tissues was performed in the area of clinical concern.  COMPARISON:  None.  FINDINGS: Ultrasound images of the sternal wound demonstrate a circumscribed heterogeneous hypoechoic region extending from the skin surface to the anterior sternum and measuring 3 x 3 x 2.6 cm. Patchy flow is demonstrated within the lesion on color flow Doppler imaging. Appearance suggest a mass lesion. Given the chronic nature of the lesion, it likely represents a infiltrating soft tissue mass. Alternatively, chronic osteomyelitis with abscess and cellulitis would be a possibility.  IMPRESSION: Circumscribed heterogeneous hypoechoic mass in the sternal wound may represent an infiltrating soft tissue mass, or less likely chronic osteomyelitis with abscess and cellulitis.   Electronically Signed   By: Lucienne Capers M.D.   On: 07/15/2020 22:11  Assessment:  Chest wall mass concerning for neoplasm.  Discussed incisional biopsy at bedside and family and patient agreeable.  Alternatives include continued observation.  Benefits include possible pathologic evaluation, Discussed the risk of surgery including recurrence, chronic pain, post-op infxn, poor cosmesis, poor/delayed wound healing, and possible re-operation to address said risks. Procedure as noted below:  Pre-Op Dx: Chest wall mass Post-Op Dx: Same Anesthesia: Local EBL: 10 mm Complications:  none apparent Specimen: Chest wall  mass Procedure: Incisional biopsy of chest wall mass Surgeon: Lysle Pearl  Indications for procedure: See above.  Description of Procedure:  Consent obtained, time out performed.  Patient placed in supine position.  Area sterilized and draped in usual position.  Local infused to area previously marked.  2 cm cm incision made through dermis with electrocautery and piece of mass attempted to be removed in 1 piece.  Below the dermis, mass actually was very friable, so pieces of the tissue were removed in a piecemeal fashion.  Due to concern for possible bleeding issues at bedside with limited equipment, procedure was terminated after sampling of tissue.  Hemostasis confirmed, wound covered with 4 x 4 gauze and Tegaderm patient tolerated the procedure well.  Specimen placed within specimen cup and sent to pathology for further evaluation.  Okay to resume anticoagulation in 24 hours.

## 2020-07-17 NOTE — Plan of Care (Signed)
Shift Summary: No acute events overnight. Orthostatic VS completed. Orientedx4, VSS, no c/o pain. NIH 0. Pt did wake up confused in AM, reoriented easily, stated she was finally sleeping well and forget where she was. Fall/safety precautions in place, rounding performed, needs/concerns addressed during shift.   Problem: Education: Goal: Knowledge of General Education information will improve Description: Including pain rating scale, medication(s)/side effects and non-pharmacologic comfort measures Outcome: Progressing   Problem: Health Behavior/Discharge Planning: Goal: Ability to manage health-related needs will improve Outcome: Progressing   Problem: Clinical Measurements: Goal: Ability to maintain clinical measurements within normal limits will improve Outcome: Progressing Goal: Will remain free from infection Outcome: Progressing Goal: Diagnostic test results will improve Outcome: Progressing Goal: Respiratory complications will improve Outcome: Progressing Goal: Cardiovascular complication will be avoided Outcome: Progressing   Problem: Activity: Goal: Risk for activity intolerance will decrease Outcome: Progressing   Problem: Nutrition: Goal: Adequate nutrition will be maintained Outcome: Progressing   Problem: Coping: Goal: Level of anxiety will decrease Outcome: Progressing   Problem: Elimination: Goal: Will not experience complications related to bowel motility Outcome: Progressing Goal: Will not experience complications related to urinary retention Outcome: Progressing   Problem: Pain Managment: Goal: General experience of comfort will improve Outcome: Progressing   Problem: Safety: Goal: Ability to remain free from injury will improve Outcome: Progressing   Problem: Skin Integrity: Goal: Risk for impaired skin integrity will decrease Outcome: Progressing

## 2020-07-19 DIAGNOSIS — G43909 Migraine, unspecified, not intractable, without status migrainosus: Secondary | ICD-10-CM | POA: Diagnosis not present

## 2020-07-19 DIAGNOSIS — Z95818 Presence of other cardiac implants and grafts: Secondary | ICD-10-CM | POA: Diagnosis not present

## 2020-07-19 DIAGNOSIS — Z87440 Personal history of urinary (tract) infections: Secondary | ICD-10-CM | POA: Diagnosis not present

## 2020-07-19 DIAGNOSIS — Z8673 Personal history of transient ischemic attack (TIA), and cerebral infarction without residual deficits: Secondary | ICD-10-CM | POA: Diagnosis not present

## 2020-07-19 DIAGNOSIS — E785 Hyperlipidemia, unspecified: Secondary | ICD-10-CM | POA: Diagnosis not present

## 2020-07-19 DIAGNOSIS — I951 Orthostatic hypotension: Secondary | ICD-10-CM | POA: Diagnosis not present

## 2020-07-19 DIAGNOSIS — Z9181 History of falling: Secondary | ICD-10-CM | POA: Diagnosis not present

## 2020-07-19 DIAGNOSIS — R222 Localized swelling, mass and lump, trunk: Secondary | ICD-10-CM | POA: Diagnosis not present

## 2020-07-19 DIAGNOSIS — Z7902 Long term (current) use of antithrombotics/antiplatelets: Secondary | ICD-10-CM | POA: Diagnosis not present

## 2020-07-19 DIAGNOSIS — I252 Old myocardial infarction: Secondary | ICD-10-CM | POA: Diagnosis not present

## 2020-07-19 DIAGNOSIS — E039 Hypothyroidism, unspecified: Secondary | ICD-10-CM | POA: Diagnosis not present

## 2020-07-19 DIAGNOSIS — F32A Depression, unspecified: Secondary | ICD-10-CM | POA: Diagnosis not present

## 2020-07-19 DIAGNOSIS — I10 Essential (primary) hypertension: Secondary | ICD-10-CM | POA: Diagnosis not present

## 2020-07-19 DIAGNOSIS — N289 Disorder of kidney and ureter, unspecified: Secondary | ICD-10-CM | POA: Diagnosis not present

## 2020-07-19 NOTE — Progress Notes (Signed)
Carelink Summary Report / Loop Recorder 

## 2020-07-20 LAB — SURGICAL PATHOLOGY

## 2020-07-24 DIAGNOSIS — C50211 Malignant neoplasm of upper-inner quadrant of right female breast: Secondary | ICD-10-CM | POA: Diagnosis not present

## 2020-07-25 DIAGNOSIS — C50919 Malignant neoplasm of unspecified site of unspecified female breast: Secondary | ICD-10-CM | POA: Diagnosis not present

## 2020-07-25 DIAGNOSIS — Z1331 Encounter for screening for depression: Secondary | ICD-10-CM | POA: Diagnosis not present

## 2020-07-25 DIAGNOSIS — R93429 Abnormal radiologic findings on diagnostic imaging of unspecified kidney: Secondary | ICD-10-CM | POA: Diagnosis not present

## 2020-08-01 DIAGNOSIS — R93429 Abnormal radiologic findings on diagnostic imaging of unspecified kidney: Secondary | ICD-10-CM | POA: Diagnosis not present

## 2020-08-08 DIAGNOSIS — E782 Mixed hyperlipidemia: Secondary | ICD-10-CM | POA: Diagnosis not present

## 2020-08-08 DIAGNOSIS — R5383 Other fatigue: Secondary | ICD-10-CM | POA: Diagnosis not present

## 2020-08-08 DIAGNOSIS — L03319 Cellulitis of trunk, unspecified: Secondary | ICD-10-CM | POA: Diagnosis not present

## 2020-08-08 DIAGNOSIS — D519 Vitamin B12 deficiency anemia, unspecified: Secondary | ICD-10-CM | POA: Diagnosis not present

## 2020-08-08 DIAGNOSIS — I10 Essential (primary) hypertension: Secondary | ICD-10-CM | POA: Diagnosis not present

## 2020-08-08 DIAGNOSIS — E559 Vitamin D deficiency, unspecified: Secondary | ICD-10-CM | POA: Diagnosis not present

## 2020-08-08 DIAGNOSIS — Z131 Encounter for screening for diabetes mellitus: Secondary | ICD-10-CM | POA: Diagnosis not present

## 2020-08-08 DIAGNOSIS — R946 Abnormal results of thyroid function studies: Secondary | ICD-10-CM | POA: Diagnosis not present

## 2020-08-08 DIAGNOSIS — C50919 Malignant neoplasm of unspecified site of unspecified female breast: Secondary | ICD-10-CM | POA: Diagnosis not present

## 2020-08-14 ENCOUNTER — Ambulatory Visit (INDEPENDENT_AMBULATORY_CARE_PROVIDER_SITE_OTHER): Payer: Medicare Other

## 2020-08-14 ENCOUNTER — Encounter: Payer: Self-pay | Admitting: *Deleted

## 2020-08-14 DIAGNOSIS — I63423 Cerebral infarction due to embolism of bilateral anterior cerebral arteries: Secondary | ICD-10-CM | POA: Diagnosis not present

## 2020-08-14 LAB — CUP PACEART REMOTE DEVICE CHECK
Date Time Interrogation Session: 20220430231810
Implantable Pulse Generator Implant Date: 20191112

## 2020-08-15 ENCOUNTER — Encounter: Payer: Self-pay | Admitting: Internal Medicine

## 2020-08-15 ENCOUNTER — Inpatient Hospital Stay: Payer: Medicare Other | Attending: Internal Medicine | Admitting: Internal Medicine

## 2020-08-15 ENCOUNTER — Inpatient Hospital Stay: Payer: Medicare Other

## 2020-08-15 DIAGNOSIS — I639 Cerebral infarction, unspecified: Secondary | ICD-10-CM

## 2020-08-15 DIAGNOSIS — Z807 Family history of other malignant neoplasms of lymphoid, hematopoietic and related tissues: Secondary | ICD-10-CM | POA: Insufficient documentation

## 2020-08-15 DIAGNOSIS — C50911 Malignant neoplasm of unspecified site of right female breast: Secondary | ICD-10-CM | POA: Diagnosis not present

## 2020-08-15 DIAGNOSIS — C792 Secondary malignant neoplasm of skin: Secondary | ICD-10-CM | POA: Diagnosis not present

## 2020-08-15 DIAGNOSIS — Z803 Family history of malignant neoplasm of breast: Secondary | ICD-10-CM | POA: Diagnosis not present

## 2020-08-15 LAB — COMPREHENSIVE METABOLIC PANEL
ALT: 14 U/L (ref 0–44)
AST: 28 U/L (ref 15–41)
Albumin: 3.7 g/dL (ref 3.5–5.0)
Alkaline Phosphatase: 90 U/L (ref 38–126)
Anion gap: 12 (ref 5–15)
BUN: 25 mg/dL — ABNORMAL HIGH (ref 8–23)
CO2: 26 mmol/L (ref 22–32)
Calcium: 9.5 mg/dL (ref 8.9–10.3)
Chloride: 102 mmol/L (ref 98–111)
Creatinine, Ser: 0.94 mg/dL (ref 0.44–1.00)
GFR, Estimated: >60 mL/min (ref >60)
Glucose, Bld: 93 mg/dL (ref 70–99)
Potassium: 4 mmol/L (ref 3.5–5.1)
Sodium: 140 mmol/L (ref 135–145)
Total Bilirubin: 0.8 mg/dL (ref 0.3–1.2)
Total Protein: 7.7 g/dL (ref 6.5–8.1)

## 2020-08-15 LAB — CBC WITH DIFFERENTIAL/PLATELET
Abs Immature Granulocytes: 0.02 10*3/uL (ref 0.00–0.07)
Basophils Absolute: 0.1 10*3/uL (ref 0.0–0.1)
Basophils Relative: 1 %
Eosinophils Absolute: 0.3 10*3/uL (ref 0.0–0.5)
Eosinophils Relative: 5 %
HCT: 41.9 % (ref 36.0–46.0)
Hemoglobin: 13.5 g/dL (ref 12.0–15.0)
Immature Granulocytes: 0 %
Lymphocytes Relative: 19 %
Lymphs Abs: 1.4 10*3/uL (ref 0.7–4.0)
MCH: 31.8 pg (ref 26.0–34.0)
MCHC: 32.2 g/dL (ref 30.0–36.0)
MCV: 98.8 fL (ref 80.0–100.0)
Monocytes Absolute: 0.5 10*3/uL (ref 0.1–1.0)
Monocytes Relative: 7 %
Neutro Abs: 5 10*3/uL (ref 1.7–7.7)
Neutrophils Relative %: 68 %
Platelets: 182 10*3/uL (ref 150–400)
RBC: 4.24 MIL/uL (ref 3.87–5.11)
RDW: 13.4 % (ref 11.5–15.5)
WBC: 7.3 10*3/uL (ref 4.0–10.5)
nRBC: 0 % (ref 0.0–0.2)

## 2020-08-15 NOTE — Assessment & Plan Note (Addendum)
#  Clinical cT4 [involvement of the skin]; N0M0-breast cancer-ER/PR; HER2 unavailable.   #Had a long discussion with the patient and family-son regarding the concern for at least locally advanced breast cancer based on current available information.  I would recommend a PET scan for further evaluation; also diagnostic/mammogram/ultrasound-most likely repeat biopsy to assess the receptor profile.  #Also discussed the natural history of breast cancer-and in general the treatment options include-surgery radiation chemotherapy/plus minus endocrine therapy.  Discussed that the above treatment options for the patient will depend upon her stage/imaging/and final pathology.  #After extensive discussion-patient was reluctant; and was still undecided.  Patient stated that she will call and let us know of her preference of what she wants.  At this time we will await patient's decision.  Thank you Ms.Enid Derry FNP for allowing me to participate in the care of your pleasant patient. Please do not hesitate to contact me with questions or concerns in the interim.  # DISPOSITION: # labs today- cbc/cmp/cea; ca-15-3; ca 27-29. # follow up TBD-Dr.B

## 2020-08-15 NOTE — Progress Notes (Signed)
[facility] [place] NOTE  Patient Care Team: Mechele Claude, FNP as PCP - General (Family Medicine)  CHIEF COMPLAINTS/PURPOSE OF CONSULTATION: Breast cancer  #  Oncology History Overview Note  images of the sternal wound demonstrate a circumscribed heterogeneous hypoechoic region extending from the skin surface to the anterior sternum and measuring 3 x 3 x 2.6 cm. Patchy flow is demonstrated within the lesion on color flow Doppler imaging. Appearance suggest a mass lesion.   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  female female with no prior history of breast cancer/or malignancies has been referred to [place] for further evaluation recommendations for new diagnosis of breast cancer.  Patient was recently seen in the hospital for syncope-work-up-suggestive of vasovagal episode/orthostasis.   Incidentally patient noted to have chest wall sternal mass/ulceration-patient underwent biopsy.  Patient is here to review the results/plan of care.  Patient also have a scab/a lump on the anterior chest wall for more than 6 months.  Patient has no personal history of cancer no prior mammograms.  Patient denies any other symptoms of shortness of breath or cough.  Denies any headaches or nausea vomiting.  Review of Systems  Constitutional: Negative for chills, diaphoresis, fever, malaise/fatigue and weight loss.  HENT: Negative for nosebleeds and sore throat.   Eyes: Negative for double vision.  Respiratory: Negative for cough, hemoptysis, sputum production, shortness of breath and wheezing.   Cardiovascular: Negative for chest pain, palpitations, orthopnea and leg swelling.  Gastrointestinal: Negative for abdominal pain, blood in stool, constipation, diarrhea, heartburn, melena, nausea and vomiting.  Genitourinary: Negative  for dysuria, frequency and urgency.  Musculoskeletal: Positive for back pain and joint pain.  Skin: Negative.  Negative for itching and rash.  Neurological: Negative for dizziness, tingling, focal weakness, weakness and headaches.  Endo/Heme/Allergies: Does not bruise/bleed easily.  Psychiatric/Behavioral: Negative for depression. The patient is not nervous/anxious and does not have insomnia.      MEDICAL HISTORY:  Past Medical History:  Diagnosis Date  . Depression   . Fainting spell   . Frequent headaches   . Hyperlipidemia   . Hypertension   . Kidney lesion, native, right   . Migraine   . Stroke ([place])   . Thyroid disease   . UTI (urinary tract infection)     SURGICAL HISTORY: Past Surgical History:  Procedure Laterality Date  . LOOP RECORDER INSERTION N/A 02/24/2018   Procedure: LOOP RECORDER INSERTION;  Surgeon: Deboraha Sprang, MD;  Location: [place] [facility];  Service: Cardiovascular;  Laterality: N/A;  . TEE WITHOUT CARDIOVERSION N/A 02/24/2018   Procedure: TRANSESOPHAGEAL ECHOCARDIOGRAM (TEE);  Surgeon: Minna Merritts, MD;  Location: [facility] OR[facility];  Service: Cardiovascular;  Laterality: N/A;    SOCIAL HISTORY: Social History   Socioeconomic History  . Marital status: Widowed    Spouse name: Not on file  . Number of children: Not on file  . Years of education: Not on file  . Highest education level: Not on file  Occupational History  . Not on file  Tobacco Use  . Smoking status: Never Smoker  . Smokeless tobacco: Never Used  Vaping Use  . Vaping Use: Never used  Substance and Sexual Activity  . Alcohol use: Not Currently  . Drug use: Not Currently  . Sexual activity: Not Currently  Other Topics  Concern  . Not on file  Social History Narrative   Lives in sister in [place]; son live [place]. Quit smoking 60-70 years. No alcohol. Worked in the [employer]. Walks with cane.    Social Determinants of Health   Financial Resource Strain: Not on file  Food  Insecurity: Not on file  Transportation Needs: Not on file  Physical Activity: Not on file  Stress: Not on file  Social Connections: Not on file  Intimate Partner Violence: Not on file    FAMILY HISTORY: Family History  Problem Relation Age of Onset  . Dementia Mother   . Hypertension Mother   . Hypothyroidism Mother   . Prostate cancer Father   . Breast cancer Sister 53  . Hodgkin's lymphoma Sister     ALLERGIES:  has No Known Allergies.  MEDICATIONS:  Current Outpatient Medications  Medication Sig Dispense Refill  . Cholecalciferol (VITAMIN D3) 50 MCG (2000 UT) capsule Take 1 capsule (2,000 Units total) by mouth daily. 90 capsule 1  . clopidogrel (PLAVIX) 75 MG tablet Take 1 tablet (75 mg total) by mouth daily. 30 tablet 11  . Multiple Vitamin (MULTIVITAMIN WITH MINERALS) TABS tablet Take 1 tablet by mouth daily.     No current facility-administered medications for this visit.      Marland Kitchen  PHYSICAL EXAMINATION: ECOG PERFORMANCE STATUS: 1 - Symptomatic but completely ambulatory  Vitals:   08/15/20 1137  BP: (!) 143/97  Pulse: 89  Resp: 16  Temp: (!) 96.6 F (35.9 C)  SpO2: 99%   Filed Weights   08/15/20 1137  Weight: 165 lb (74.8 kg)    Physical Exam HENT:     Head: Normocephalic and atraumatic.     Mouth/Throat:     Pharynx: No oropharyngeal exudate.  Eyes:     Pupils: Pupils are equal, round, and reactive to light.  Cardiovascular:     Rate and Rhythm: Normal rate and regular rhythm.  Pulmonary:     Effort: No respiratory distress.     Breath sounds: No wheezing.  Abdominal:     General: Bowel sounds are normal. There is no distension.     Palpations: Abdomen is soft. There is no mass.     Tenderness: There is no abdominal tenderness. There is no guarding or rebound.  Musculoskeletal:        General: No tenderness. Normal range of motion.     Cervical back: Normal range of motion and neck supple.  Skin:    General: Skin is warm.     Comments: 3 cm  ulcerated lesion noted right parasternal ~approximately 2 inches below the right clavicle; associated with vague mass.  Bilateral breast exam did not reveal any obvious masses.  Lymphadenopathy noted.(In presence of chaperone)  Neurological:     Mental Status: She is alert and oriented to person, place, and time.  Psychiatric:        Mood and Affect: Affect normal.      LABORATORY DATA:  I have reviewed the data as listed Lab Results  Component Value Date   WBC 7.3 08/15/2020   HGB 13.5 08/15/2020   HCT 41.9 08/15/2020   MCV 98.8 08/15/2020   PLT 182 08/15/2020   Recent Labs    07/15/20 1221 07/17/20 0458 08/15/20 1238  NA 142 141 140  K 3.8 3.7 4.0  CL 110 111 102  CO2 '25 26 26  ' GLUCOSE 155* 87 93  BUN 19 11 25*  CREATININE 1.08* 0.71 0.94  CALCIUM  8.7* 8.9 9.5  GFRNONAA 52* >60 >60  PROT 6.3* 5.9* 7.7  ALBUMIN 3.1* 3.0* 3.7  AST '29 23 28  ' ALT '12 14 14  ' ALKPHOS 67 64 90  BILITOT 0.8 0.7 0.8    RADIOGRAPHIC STUDIES: I have personally reviewed the radiological images as listed and agreed with the findings in the report. ECHOCARDIOGRAM COMPLETE  Result Date: 07/17/2020    ECHOCARDIOGRAM REPORT   Patient Name:   CILICIA BORDEN Date of Exam: 07/17/2020 Medical Rec #:  [id number]        Height:       64.0 in Accession #:    [id number]       Weight:       166.2 lb Date of Birth:  July 08, 1940         BSA:          1.808 m Patient Age:    38 years         BP:           159/79 mmHg Patient Gender: F                HR:           78 bpm. Exam Location:  [facility] Procedure: 2D Echo, Color Doppler, Cardiac Doppler and Strain Analysis Indications:     R55 Syncope  History:         Patient has prior history of Echocardiogram examinations.                  Previous Myocardial Infarction, Stroke; Risk                  Factors:Hypertension and Dyslipidemia.  Sonographer:     Charmayne Sheer RDCS (AE) Referring Phys:  [place] Diagnosing Phys: Yolonda Kida MD  Sonographer Comments:  Suboptimal parasternal window and no subcostal window. Global longitudinal strain was attempted. IMPRESSIONS  1. Left ventricular ejection fraction, by estimation, is 60 to 65%. The left ventricle has normal function. The left ventricle has no regional wall motion abnormalities. Left ventricular diastolic parameters are consistent with Grade II diastolic dysfunction (pseudonormalization).  2. Right ventricular systolic function is normal. The right ventricular size is normal.  3. The mitral valve is normal in structure. Trivial mitral valve regurgitation.  4. The aortic valve is normal in structure. Aortic valve regurgitation is not visualized. Conclusion(s)/Recommendation(s): Normal biventricular function without evidence of hemodynamically significant valvular heart disease. FINDINGS  Left Ventricle: Left ventricular ejection fraction, by estimation, is 60 to 65%. The left ventricle has normal function. The left ventricle has no regional wall motion abnormalities. The left ventricular internal cavity size was normal in size. There is  no left ventricular hypertrophy. Left ventricular diastolic parameters are consistent with Grade II diastolic dysfunction (pseudonormalization). Right Ventricle: The right ventricular size is normal. No increase in right ventricular wall thickness. Right ventricular systolic function is normal. Left Atrium: Left atrial size was normal in size. Right Atrium: Right atrial size was normal in size. Pericardium: There is no evidence of pericardial effusion. Mitral Valve: The mitral valve is normal in structure. Trivial mitral valve regurgitation. MV peak gradient, 4.9 mmHg. The mean mitral valve gradient is 2.0 mmHg. Tricuspid Valve: The tricuspid valve is normal in structure. Tricuspid valve regurgitation is trivial. Aortic Valve: The aortic valve is normal in structure. Aortic valve regurgitation is not visualized. Aortic valve mean gradient measures 5.0 mmHg. Aortic valve peak gradient  measures 8.1 mmHg. Pulmonic Valve:  The pulmonic valve was normal in structure. Pulmonic valve regurgitation is not visualized. Aorta: The ascending aorta was not well visualized. IAS/Shunts: No atrial level shunt detected by color flow Doppler.  LEFT VENTRICLE PLAX 2D LVIDd:         3.40 cm Diastology LVIDs:         2.30 cm LV e' medial:    8.59 cm/s LV PW:         1.00 cm LV E/e' medial:  10.6 LV IVS:        0.80 cm LV e' lateral:   7.18 cm/s                        LV E/e' lateral: 12.6  LEFT ATRIUM             Index LA diam:        3.10 cm 1.71 cm/m LA Vol (A2C):   20.6 ml 11.39 ml/m LA Vol (A4C):   23.0 ml 12.72 ml/m LA Biplane Vol: 22.6 ml 12.50 ml/m  AORTIC VALVE                    PULMONIC VALVE AV Vmax:           142.00 cm/s  PV Vmax:       1.12 m/s AV Vmean:          105.000 cm/s PV Vmean:      73.900 cm/s AV VTI:            0.284 m      PV VTI:        0.203 m AV Peak Grad:      8.1 mmHg     PV Peak grad:  5.0 mmHg AV Mean Grad:      5.0 mmHg     PV Mean grad:  3.0 mmHg LVOT Vmax:         110.00 cm/s LVOT Vmean:        76.900 cm/s LVOT VTI:          0.230 m LVOT/AV VTI ratio: 0.81 MITRAL VALVE MV Area (PHT): 3.01 cm     SHUNTS MV Peak grad:  4.9 mmHg     Systemic VTI: 0.23 m MV Mean grad:  2.0 mmHg MV Vmax:       1.11 m/s MV Vmean:      66.2 cm/s MV Decel Time: 252 msec MV E velocity: 90.80 cm/s MV A velocity: 124.00 cm/s MV E/A ratio:  0.73 Dwayne Prince [place] MD Electronically signed by Yolonda Kida MD Signature Date/Time: 07/17/2020/4:34:52 PM    Final    CUP PACEART REMOTE DEVICE CHECK  Result Date: 08/14/2020 ILR summary report received. Battery status OK. Normal device function. No new symptom, tachy, brady, or pause episodes. No new AF episodes. Monthly summary reports and ROV/PRN. 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   #Had a long discussion with the patient  and family-son regarding the concern for at least locally advanced breast cancer based on current available information.  I would recommend a PET scan for further evaluation; also diagnostic/mammogram/ultrasound-most likely repeat biopsy to assess the receptor profile.  #Also discussed the natural history of breast cancer-and in general the treatment options include-surgery radiation chemotherapy/plus minus endocrine therapy.  Discussed that the above treatment options for the patient will depend upon  her stage/imaging/and final pathology.  #After extensive discussion-patient was reluctant; and was still undecided.  Patient stated that she will call and let us know of her preference of what she wants.  At this time we will await patient's decision.  Thank you Ms.Enid Derry FNP for allowing me to participate in the care of your pleasant patient. Please do not hesitate to contact me with questions or concerns in the interim.  # DISPOSITION: # labs today- cbc/cmp/cea; ca-15-3; ca 27-29. # follow up TBD-Dr.B  All questions were answered. The patient/family knows to call the clinic with any problems, questions or concerns.    Cammie Sickle, MD 08/16/2020 7:46 AM

## 2020-08-16 LAB — CEA: CEA: 4.8 ng/mL — ABNORMAL HIGH (ref 0.0–4.7)

## 2020-08-16 LAB — CANCER ANTIGEN 27.29: CA 27.29: 14.5 U/mL (ref 0.0–38.6)

## 2020-08-16 LAB — CANCER ANTIGEN 15-3: CA 15-3: 10.6 U/mL (ref 0.0–25.0)

## 2020-08-17 ENCOUNTER — Encounter: Payer: Self-pay | Admitting: *Deleted

## 2020-08-17 NOTE — Progress Notes (Signed)
Patient is newly diagnosed with breast cancer.  Further work up of a PET scan, mammo and ultrasound and possible biopsy, recommended by Dr. Rogue Bussing.  Per Dr. Aletha Halim request, I have called patient to follow up on her desire to pursue further testing.  Left message with a female in the home to have the patient return my call.

## 2020-08-18 DIAGNOSIS — I951 Orthostatic hypotension: Secondary | ICD-10-CM | POA: Diagnosis not present

## 2020-08-18 DIAGNOSIS — G43909 Migraine, unspecified, not intractable, without status migrainosus: Secondary | ICD-10-CM | POA: Diagnosis not present

## 2020-08-18 DIAGNOSIS — Z8673 Personal history of transient ischemic attack (TIA), and cerebral infarction without residual deficits: Secondary | ICD-10-CM | POA: Diagnosis not present

## 2020-08-18 DIAGNOSIS — N289 Disorder of kidney and ureter, unspecified: Secondary | ICD-10-CM | POA: Diagnosis not present

## 2020-08-18 DIAGNOSIS — E785 Hyperlipidemia, unspecified: Secondary | ICD-10-CM | POA: Diagnosis not present

## 2020-08-18 DIAGNOSIS — R222 Localized swelling, mass and lump, trunk: Secondary | ICD-10-CM | POA: Diagnosis not present

## 2020-08-18 DIAGNOSIS — Z9181 History of falling: Secondary | ICD-10-CM | POA: Diagnosis not present

## 2020-08-18 DIAGNOSIS — F32A Depression, unspecified: Secondary | ICD-10-CM | POA: Diagnosis not present

## 2020-08-18 DIAGNOSIS — E039 Hypothyroidism, unspecified: Secondary | ICD-10-CM | POA: Diagnosis not present

## 2020-08-18 DIAGNOSIS — Z87440 Personal history of urinary (tract) infections: Secondary | ICD-10-CM | POA: Diagnosis not present

## 2020-08-18 DIAGNOSIS — Z7902 Long term (current) use of antithrombotics/antiplatelets: Secondary | ICD-10-CM | POA: Diagnosis not present

## 2020-08-18 DIAGNOSIS — I10 Essential (primary) hypertension: Secondary | ICD-10-CM | POA: Diagnosis not present

## 2020-08-18 DIAGNOSIS — Z95818 Presence of other cardiac implants and grafts: Secondary | ICD-10-CM | POA: Diagnosis not present

## 2020-08-18 DIAGNOSIS — I252 Old myocardial infarction: Secondary | ICD-10-CM | POA: Diagnosis not present

## 2020-08-30 ENCOUNTER — Inpatient Hospital Stay (HOSPITAL_BASED_OUTPATIENT_CLINIC_OR_DEPARTMENT_OTHER): Payer: Medicare Other | Admitting: Hospice and Palliative Medicine

## 2020-08-30 ENCOUNTER — Telehealth: Payer: Self-pay | Admitting: *Deleted

## 2020-08-30 DIAGNOSIS — C50911 Malignant neoplasm of unspecified site of right female breast: Secondary | ICD-10-CM

## 2020-08-30 DIAGNOSIS — C792 Secondary malignant neoplasm of skin: Secondary | ICD-10-CM

## 2020-08-30 NOTE — Telephone Encounter (Signed)
RN left vm that call was being returned.   Note:  ca27.29 was 14.5 cea was 4.8 ca15-3 was 10.6     Dr. Sharmaine Base last disposition  #Clinical cT4 [involvement of the skin]; N0M0-breast cancer-ER/PR; HER2 unavailable.   #Had a long discussion with the patient and family-son regarding the concern for at least locally advanced breast cancer based on current available information.  I would recommend a PET scan for further evaluation; also diagnostic/mammogram/ultrasound-most likely repeat biopsy to assess the receptor profile.  #Also discussed the natural history of breast cancer-and in general the treatment options include-surgery radiation chemotherapy/plus minus endocrine therapy.  Discussed that the above treatment options for the patient will depend upon her stage/imaging/and final pathology.  #After extensive discussion-patient was reluctant; and was still undecided.  Patient stated that she will call and let us know of her preference of what she wants.  At this time we will await patient's decision

## 2020-08-30 NOTE — Progress Notes (Signed)
Multidisciplinary Oncology Council Documentation  Kaylee [name] was presented by our [facility] on 08/30/2020, which included representatives from:  . Palliative Care . Dietitian  . Physical/Occupational Therapist . Nurse Navigator . Genetics . Speech Therapist . Social work . Survivorship RN . Hotel manager . Research RN . [place] Representative  Kaylee [name] currently presents with history of locally advanced breast cancer  We reviewed previous medical and familial history, history of present illness, and recent lab results along with all available histopathologic and imaging studies. The [place] considered available treatment options and made the following recommendations/referrals:  Consider genetics, home-palliative care  The MOC is a meeting of clinicians from various specialty areas who evaluate and discuss patients for whom a multidisciplinary approach is being considered. Final determinations in the plan of care are those of the provider(s).   Today's extended care, comprehensive team conference, Kaylee [name] was not present for the discussion and was not examined.

## 2020-08-30 NOTE — Telephone Encounter (Signed)
Son Merry Proud called asking for lab results

## 2020-08-31 ENCOUNTER — Encounter: Payer: Self-pay | Admitting: *Deleted

## 2020-08-31 NOTE — Progress Notes (Signed)
Patient's son Dellis Filbert returned my call.  He would like to review the patients lab work.  Reviewed patient's labs with him.  We discussed patients intent for further testing and possible treatment.  He states the family is trying to get her to at least have the PET scan, but she has not agreed to anything at this time.  He asked that we give them a couple of more weeks to discuss with his mom and hopefully she will agree to additional testing.

## 2020-09-01 NOTE — Progress Notes (Signed)
Carelink Summary Report / Loop Recorder 

## 2020-09-17 LAB — CUP PACEART REMOTE DEVICE CHECK
Date Time Interrogation Session: 20220602232158
Implantable Pulse Generator Implant Date: 20191112

## 2020-09-18 ENCOUNTER — Telehealth: Payer: Self-pay

## 2020-09-18 NOTE — Telephone Encounter (Signed)
The patient son Kaylee [name] states the patient does not want to do anything with healthcare. She wants [place] to cancel all upcoming remote checks. She was diagnosed with cancer and just want nature to take its coarse. I let the nurse know and she is going to give them a call.

## 2020-09-21 NOTE — Telephone Encounter (Signed)
Attempted to contact patient to follow up. Called phone number on file patient and Kaylee [name]. No answer. Not able to leave VM d/t mailbox is full. Will follow up at later date.

## 2020-09-25 NOTE — Telephone Encounter (Signed)
I am fine with that  thnx

## 2021-12-31 ENCOUNTER — Telehealth: Payer: Self-pay | Admitting: *Deleted

## 2021-12-31 NOTE — Patient Outreach (Signed)
  Care Coordination   12/31/2021 Name: Kaylee [name] MRN: [id number] DOB: 07-05-1940   Care Coordination Outreach Attempts:  An unsuccessful telephone outreach was attempted today to offer the patient information about available care coordination services as a benefit of their health plan.   Follow Up Plan:  Additional outreach attempts will be made to offer the patient care coordination information and services.   Encounter Outcome:  No Answer  Care Coordination Interventions Activated:  No   Care Coordination Interventions:  No, not indicated    SIG Bradon Fester C. Myrtie Neither, MSN, [facility] Gerontological Nurse Practitioner [facility] Care Management [phone]

## 2022-01-28 ENCOUNTER — Telehealth: Payer: Self-pay | Admitting: *Deleted

## 2022-01-28 NOTE — Patient Outreach (Signed)
  Care Coordination   Initial Visit Note   01/28/2022 Name: Kaylee [name] MRN: [id number] DOB: 1941-01-03  Kaylee [name] is a 81 y.o. year old female who sees Mechele Claude, FNP for primary care. I spoke with  unidentified female by phone today who states patient is not available at present.  What matters to the patients health and wellness today?  Did not speak with patient today    Goals Addressed   None     SDOH assessments and interventions completed:  No     Care Coordination Interventions Activated:  No  Care Coordination Interventions:  No, not indicated   Follow up plan: Follow up call scheduled for - will attempt 3rd outreach    Encounter Outcome:  Pt. Request to Call Back   Jacqlyn Larsen [facility], BSN [facility] RN Care Coordinator [phone]

## 2022-02-15 ENCOUNTER — Encounter: Payer: Self-pay | Admitting: *Deleted

## 2022-02-15 ENCOUNTER — Telehealth: Payer: Self-pay | Admitting: *Deleted

## 2022-02-15 NOTE — Patient Outreach (Signed)
  Care Coordination   Initial Visit Note   02/15/2022 Name: Kaylee [name] MRN: [id number] DOB: 10/19/1940  [name] is a 81 y.o. year old female who sees Mechele Claude, FNP for primary care. I spoke with  Yliana L Jarchow's sister and caregiver, Delorise Shiner, by phone today.  What matters to the patients health and wellness today?  Kaylee [name] is well cared for by her family and she has minimal health issues. She is not on any Rxs. She has not had an AWV as her sister says she has no problems and pt doesn't think she needs to go.  Encouraged pt sister to please schedule an AWV to meet Medicare requirements. She says she will let pt's son know this.   SDOH assessments and interventions completed:  Yes  SDOH Interventions Today    Flowsheet Row Most Recent Value  SDOH Interventions   Food Insecurity Interventions Intervention Not Indicated  Housing Interventions Intervention Not Indicated  Transportation Interventions Intervention Not Indicated  Utilities Interventions Intervention Not Indicated        Care Coordination Interventions Activated:  YES  Care Coordination Interventions:  Yes, provided   Follow up plan: No further intervention required.   Encounter Outcome:  Pt. Visit Completed   Kayleen Memos C. Myrtie Neither, MSN, [facility] Gerontological Nurse Practitioner [facility] Care Management [phone]

## 2022-12-19 ENCOUNTER — Emergency Department: Payer: Medicare Other

## 2022-12-19 ENCOUNTER — Inpatient Hospital Stay
Admission: EM | Admit: 2022-12-19 | Discharge: 2022-12-21 | DRG: 872 | Disposition: A | Discharge status: Home Health | Payer: Medicare Other | Attending: Internal Medicine | Admitting: Internal Medicine

## 2022-12-19 ENCOUNTER — Other Ambulatory Visit: Payer: Self-pay

## 2022-12-19 DIAGNOSIS — C50919 Malignant neoplasm of unspecified site of unspecified female breast: Secondary | ICD-10-CM | POA: Diagnosis present

## 2022-12-19 DIAGNOSIS — Z66 Do not resuscitate: Secondary | ICD-10-CM | POA: Diagnosis present

## 2022-12-19 DIAGNOSIS — N390 Urinary tract infection, site not specified: Secondary | ICD-10-CM | POA: Diagnosis present

## 2022-12-19 DIAGNOSIS — Z8042 Family history of malignant neoplasm of prostate: Secondary | ICD-10-CM | POA: Diagnosis not present

## 2022-12-19 DIAGNOSIS — Z8673 Personal history of transient ischemic attack (TIA), and cerebral infarction without residual deficits: Secondary | ICD-10-CM | POA: Diagnosis not present

## 2022-12-19 DIAGNOSIS — Z1152 Encounter for screening for COVID-19: Secondary | ICD-10-CM | POA: Diagnosis not present

## 2022-12-19 DIAGNOSIS — R000 Tachycardia, unspecified: Secondary | ICD-10-CM | POA: Diagnosis not present

## 2022-12-19 DIAGNOSIS — Z803 Family history of malignant neoplasm of breast: Secondary | ICD-10-CM

## 2022-12-19 DIAGNOSIS — R0902 Hypoxemia: Secondary | ICD-10-CM | POA: Diagnosis not present

## 2022-12-19 DIAGNOSIS — F32A Depression, unspecified: Secondary | ICD-10-CM | POA: Diagnosis not present

## 2022-12-19 DIAGNOSIS — Y92009 Unspecified place in unspecified non-institutional (private) residence as the place of occurrence of the external cause: Secondary | ICD-10-CM

## 2022-12-19 DIAGNOSIS — G9340 Encephalopathy, unspecified: Secondary | ICD-10-CM | POA: Diagnosis not present

## 2022-12-19 DIAGNOSIS — R531 Weakness: Principal | ICD-10-CM

## 2022-12-19 DIAGNOSIS — R627 Adult failure to thrive: Secondary | ICD-10-CM

## 2022-12-19 DIAGNOSIS — I10 Essential (primary) hypertension: Secondary | ICD-10-CM | POA: Diagnosis not present

## 2022-12-19 DIAGNOSIS — E785 Hyperlipidemia, unspecified: Secondary | ICD-10-CM | POA: Diagnosis present

## 2022-12-19 DIAGNOSIS — W19XXXA Unspecified fall, initial encounter: Secondary | ICD-10-CM | POA: Diagnosis not present

## 2022-12-19 DIAGNOSIS — R6520 Severe sepsis without septic shock: Secondary | ICD-10-CM | POA: Diagnosis not present

## 2022-12-19 DIAGNOSIS — Z807 Family history of other malignant neoplasms of lymphoid, hematopoietic and related tissues: Secondary | ICD-10-CM | POA: Diagnosis not present

## 2022-12-19 DIAGNOSIS — E872 Acidosis, unspecified: Secondary | ICD-10-CM | POA: Diagnosis not present

## 2022-12-19 DIAGNOSIS — R404 Transient alteration of awareness: Secondary | ICD-10-CM | POA: Diagnosis not present

## 2022-12-19 DIAGNOSIS — I639 Cerebral infarction, unspecified: Secondary | ICD-10-CM | POA: Diagnosis not present

## 2022-12-19 DIAGNOSIS — I771 Stricture of artery: Secondary | ICD-10-CM | POA: Diagnosis not present

## 2022-12-19 DIAGNOSIS — I6523 Occlusion and stenosis of bilateral carotid arteries: Secondary | ICD-10-CM | POA: Diagnosis not present

## 2022-12-19 DIAGNOSIS — I672 Cerebral atherosclerosis: Secondary | ICD-10-CM | POA: Diagnosis not present

## 2022-12-19 DIAGNOSIS — G319 Degenerative disease of nervous system, unspecified: Secondary | ICD-10-CM | POA: Diagnosis not present

## 2022-12-19 DIAGNOSIS — A419 Sepsis, unspecified organism: Principal | ICD-10-CM | POA: Diagnosis present

## 2022-12-19 DIAGNOSIS — I959 Hypotension, unspecified: Secondary | ICD-10-CM | POA: Diagnosis not present

## 2022-12-19 DIAGNOSIS — Z8249 Family history of ischemic heart disease and other diseases of the circulatory system: Secondary | ICD-10-CM

## 2022-12-19 DIAGNOSIS — R918 Other nonspecific abnormal finding of lung field: Secondary | ICD-10-CM | POA: Diagnosis not present

## 2022-12-19 DIAGNOSIS — Z7902 Long term (current) use of antithrombotics/antiplatelets: Secondary | ICD-10-CM | POA: Diagnosis not present

## 2022-12-19 DIAGNOSIS — Z043 Encounter for examination and observation following other accident: Secondary | ICD-10-CM | POA: Diagnosis not present

## 2022-12-19 DIAGNOSIS — E039 Hypothyroidism, unspecified: Secondary | ICD-10-CM | POA: Diagnosis present

## 2022-12-19 DIAGNOSIS — R55 Syncope and collapse: Secondary | ICD-10-CM | POA: Diagnosis not present

## 2022-12-19 DIAGNOSIS — I2510 Atherosclerotic heart disease of native coronary artery without angina pectoris: Secondary | ICD-10-CM | POA: Diagnosis present

## 2022-12-19 DIAGNOSIS — Z0389 Encounter for observation for other suspected diseases and conditions ruled out: Secondary | ICD-10-CM | POA: Diagnosis not present

## 2022-12-19 DIAGNOSIS — R0689 Other abnormalities of breathing: Secondary | ICD-10-CM | POA: Diagnosis not present

## 2022-12-19 DIAGNOSIS — E876 Hypokalemia: Secondary | ICD-10-CM | POA: Diagnosis not present

## 2022-12-19 LAB — URINALYSIS, W/ REFLEX TO CULTURE (INFECTION SUSPECTED)
Bacteria, UA: NONE SEEN
Bilirubin Urine: NEGATIVE
Glucose, UA: NEGATIVE mg/dL
Ketones, ur: 20 mg/dL — AB
Nitrite: NEGATIVE
Protein, ur: NEGATIVE mg/dL
Specific Gravity, Urine: 1.006 (ref 1.005–1.030)
Squamous Epithelial / HPF: NONE SEEN /HPF (ref 0–5)
WBC, UA: NONE SEEN WBC/hpf (ref 0–5)
pH: 6 (ref 5.0–8.0)

## 2022-12-19 LAB — LACTIC ACID, PLASMA
Lactic Acid, Venous: 4.5 mmol/L (ref 0.5–1.9)
Lactic Acid, Venous: 4.3 mmol/L (ref 0.5–1.9)

## 2022-12-19 LAB — CBC WITH DIFFERENTIAL/PLATELET
Abs Immature Granulocytes: 0.07 10*3/uL (ref 0.00–0.07)
Basophils Absolute: 0 10*3/uL (ref 0.0–0.1)
Basophils Relative: 0 %
Eosinophils Absolute: 0 10*3/uL (ref 0.0–0.5)
Eosinophils Relative: 0 %
HCT: 36.7 % (ref 36.0–46.0)
Hemoglobin: 10.8 g/dL — ABNORMAL LOW (ref 12.0–15.0)
Immature Granulocytes: 1 %
Lymphocytes Relative: 6 %
Lymphs Abs: 0.7 10*3/uL (ref 0.7–4.0)
MCH: 25.7 pg — ABNORMAL LOW (ref 26.0–34.0)
MCHC: 29.4 g/dL — ABNORMAL LOW (ref 30.0–36.0)
MCV: 87.2 fL (ref 80.0–100.0)
Monocytes Absolute: 0.6 10*3/uL (ref 0.1–1.0)
Monocytes Relative: 5 %
Neutro Abs: 11.1 10*3/uL — ABNORMAL HIGH (ref 1.7–7.7)
Neutrophils Relative %: 88 %
Platelets: 246 10*3/uL (ref 150–400)
RBC: 4.21 MIL/uL (ref 3.87–5.11)
RDW: 16.3 % — ABNORMAL HIGH (ref 11.5–15.5)
WBC: 12.6 10*3/uL — ABNORMAL HIGH (ref 4.0–10.5)
nRBC: 0.2 % (ref 0.0–0.2)

## 2022-12-19 LAB — COMPREHENSIVE METABOLIC PANEL
ALT: 12 U/L (ref 0–44)
AST: 38 U/L (ref 15–41)
Albumin: 2.8 g/dL — ABNORMAL LOW (ref 3.5–5.0)
Alkaline Phosphatase: 85 U/L (ref 38–126)
Anion gap: 22 — ABNORMAL HIGH (ref 5–15)
BUN: 17 mg/dL (ref 8–23)
CO2: 16 mmol/L — ABNORMAL LOW (ref 22–32)
Calcium: 10 mg/dL (ref 8.9–10.3)
Chloride: 101 mmol/L (ref 98–111)
Creatinine, Ser: 1.1 mg/dL — ABNORMAL HIGH (ref 0.44–1.00)
GFR, Estimated: 50 mL/min — ABNORMAL LOW (ref >60)
Glucose, Bld: 120 mg/dL — ABNORMAL HIGH (ref 70–99)
Potassium: 4 mmol/L (ref 3.5–5.1)
Sodium: 139 mmol/L (ref 135–145)
Total Bilirubin: 1.3 mg/dL — ABNORMAL HIGH (ref 0.3–1.2)
Total Protein: 7.4 g/dL (ref 6.5–8.1)

## 2022-12-19 LAB — PROTIME-INR
INR: 1.2 (ref 0.8–1.2)
Prothrombin Time: 15.4 s — ABNORMAL HIGH (ref 11.4–15.2)

## 2022-12-19 LAB — APTT: aPTT: 32 s (ref 24–36)

## 2022-12-19 LAB — SARS CORONAVIRUS 2 BY RT PCR: SARS Coronavirus 2 by RT PCR: NEGATIVE

## 2022-12-19 MED ORDER — VANCOMYCIN HCL IN DEXTROSE 1-5 GM/200ML-% IV SOLN
1000.0000 mg | Freq: Once | INTRAVENOUS | Status: DC
  Filled 2022-12-19: qty 200

## 2022-12-19 MED ORDER — SODIUM CHLORIDE 0.9 % IV SOLN: 2.0000 g | Freq: Two times a day (BID) | INTRAVENOUS | Status: DC

## 2022-12-19 MED ORDER — ACETAMINOPHEN 650 MG RE SUPP: 650.0000 mg | Freq: Four times a day (QID) | RECTAL | Status: DC | PRN

## 2022-12-19 MED ORDER — VITAMIN D 25 MCG (1000 UNIT) PO TABS
2000.0000 [IU] | ORAL_TABLET | Freq: Every day | ORAL | Status: DC
  Administered 2022-12-20 – 2022-12-21 (×2): 2000 [IU] via ORAL
  Filled 2022-12-19 (×2): qty 2

## 2022-12-19 MED ORDER — LACTATED RINGERS IV SOLN: INTRAVENOUS | Status: DC

## 2022-12-19 MED ORDER — SODIUM CHLORIDE 0.9 % IV SOLN: 2.0000 g | Freq: Once | INTRAVENOUS | Status: DC

## 2022-12-19 MED ORDER — VANCOMYCIN HCL 500 MG/100ML IV SOLN
500.0000 mg | INTRAVENOUS | Status: DC
  Filled 2022-12-19: qty 100

## 2022-12-19 MED ORDER — ENOXAPARIN SODIUM 40 MG/0.4ML IJ SOSY
40.0000 mg | PREFILLED_SYRINGE | INTRAMUSCULAR | Status: DC
  Administered 2022-12-20 – 2022-12-21 (×2): 40 mg via SUBCUTANEOUS
  Filled 2022-12-19 (×2): qty 0.4

## 2022-12-19 MED ORDER — VANCOMYCIN HCL 1250 MG/250ML IV SOLN
1250.0000 mg | Freq: Once | INTRAVENOUS | Status: AC
  Administered 2022-12-20: 1250 mg via INTRAVENOUS
  Filled 2022-12-19: qty 250

## 2022-12-19 MED ORDER — ACETAMINOPHEN 325 MG PO TABS: 650.0000 mg | ORAL_TABLET | Freq: Four times a day (QID) | ORAL | Status: DC | PRN

## 2022-12-19 MED ORDER — LABETALOL HCL 5 MG/ML IV SOLN: 20.0000 mg | INTRAVENOUS | Status: DC | PRN

## 2022-12-19 MED ORDER — LACTATED RINGERS IV SOLN
150.0000 mL/h | INTRAVENOUS | Status: AC
  Administered 2022-12-19: 150 mL/h via INTRAVENOUS

## 2022-12-19 MED ORDER — ONDANSETRON HCL 4 MG/2ML IJ SOLN: 4.0000 mg | Freq: Four times a day (QID) | INTRAMUSCULAR | Status: DC | PRN

## 2022-12-19 MED ORDER — METRONIDAZOLE 500 MG/100ML IV SOLN
500.0000 mg | Freq: Two times a day (BID) | INTRAVENOUS | Status: DC
  Administered 2022-12-19 – 2022-12-20 (×2): 500 mg via INTRAVENOUS
  Filled 2022-12-19 (×2): qty 100

## 2022-12-19 MED ORDER — TRAZODONE HCL 50 MG PO TABS
25.0000 mg | ORAL_TABLET | Freq: Every evening | ORAL | Status: DC | PRN
  Administered 2022-12-20: 25 mg via ORAL
  Filled 2022-12-19: qty 1

## 2022-12-19 MED ORDER — ADULT MULTIVITAMIN W/MINERALS CH: 1.0000 | ORAL_TABLET | Freq: Every day | ORAL | Status: DC

## 2022-12-19 MED ORDER — SODIUM CHLORIDE 0.9 % IV SOLN
2.0000 g | Freq: Once | INTRAVENOUS | Status: AC
  Administered 2022-12-19: 2 g via INTRAVENOUS
  Filled 2022-12-19: qty 12.5

## 2022-12-19 MED ORDER — ADULT MULTIVITAMIN W/MINERALS CH
1.0000 | ORAL_TABLET | Freq: Every day | ORAL | Status: DC
  Administered 2022-12-20 – 2022-12-21 (×2): 1 via ORAL
  Filled 2022-12-19 (×2): qty 1

## 2022-12-19 MED ORDER — LACTATED RINGERS IV BOLUS
1000.0000 mL | Freq: Once | INTRAVENOUS | Status: AC
  Administered 2022-12-19: 1000 mL via INTRAVENOUS

## 2022-12-19 MED ORDER — SODIUM CHLORIDE 0.9 % IV SOLN
2.0000 g | Freq: Two times a day (BID) | INTRAVENOUS | Status: AC
  Administered 2022-12-20 (×2): 2 g via INTRAVENOUS
  Filled 2022-12-19 (×2): qty 12.5

## 2022-12-19 MED ORDER — MAGNESIUM HYDROXIDE 400 MG/5ML PO SUSP: 30.0000 mL | Freq: Every day | ORAL | Status: DC | PRN

## 2022-12-19 MED ORDER — ONDANSETRON HCL 4 MG PO TABS: 4.0000 mg | ORAL_TABLET | Freq: Four times a day (QID) | ORAL | Status: DC | PRN

## 2022-12-19 MED ORDER — VANCOMYCIN HCL IN DEXTROSE 1-5 GM/200ML-% IV SOLN: 1000.0000 mg | Freq: Once | INTRAVENOUS | Status: DC

## 2022-12-19 NOTE — Assessment & Plan Note (Signed)
-   We will continue her antiplatelet therapy.

## 2022-12-19 NOTE — Progress Notes (Signed)
Pharmacy Antibiotic Note  Kaylee [name] is a 82 y.o. female admitted on 12/19/2022 with sepsis.  Pharmacy has been consulted for Cefepime & Vancomycin dosing for 7 days.  Plan: Cefepime 2 gm q12hr per indication & renal fxn.  Pt given Vancomycin 1250 mg once. Vancomycin 500 mg IV Q18 hrs. Goal AUC 400-550. Expected AUC: 500 SCr used: 1.1  Pharmacy will continue to follow and will adjust abx dosing whenever warranted.  Temp (24hrs), Avg:98.2 F (36.8 C), Min:98.2 F (36.8 C), Max:98.2 F (36.8 C)   Recent Labs  Lab 12/19/22 1833 12/19/22 2026  WBC 12.6*  --   CREATININE 1.10*  --   LATICACIDVEN 4.5* 4.3*    Estimated Creatinine Clearance: 33.2 mL/min (A) (by C-G formula based on SCr of 1.1 mg/dL (H)).    No Known Allergies  Antimicrobials this admission: 9/5 Cefepime >> x 7 days 9/5 Vancomycin >> x 7 days 9/5 Flagyl >> x 7 days  Microbiology results: 9/5 BCx: Pending  Thank you for allowing pharmacy to be a part of this patient's care.  Otelia Sergeant, PharmD, [facility] 12/19/2022 9:57 PM

## 2022-12-19 NOTE — Assessment & Plan Note (Signed)
-   Will continue Plavix.

## 2022-12-19 NOTE — Assessment & Plan Note (Addendum)
-   We will continue her antihypertensive therapy with improvement of her blood pressure as she was hypotensive.. - She will be placed on as needed IV labetalol.

## 2022-12-19 NOTE — ED Notes (Signed)
Pt. Ambulated to toilet and back to bed.  Pt. Placed back on monitor.

## 2022-12-19 NOTE — H&P (Signed)
[place]   PATIENT NAME: Kaylee [name]    MR#:  [id number]  DATE OF BIRTH:  05-06-1940  DATE OF ADMISSION:  12/19/2022  PRIMARY CARE PHYSICIAN: Patient, No Pcp Per   Patient is coming from: Home  REQUESTING/REFERRING PHYSICIAN: Phineas Semen, MD  CHIEF COMPLAINT:   Chief Complaint  Patient presents with   Code Sepsis  Generalized weakness and fall  HISTORY OF PRESENT ILLNESS:  MAKELL LACEWELL is a 82 y.o. female with medical history significant for hypertension, dyslipidemia, hypothyroidism, UTI and depression, who presented to the emergency room with acute onset of generalized weakness and fall.  Family stated that for the last couple of months she has been having diminished appetite and p.o. intake and has had weight loss.  She was diagnosed with breast cancer couple years ago and then chose not to be treated.  She was very weak today and fell and could not get up.  She required assistance.  She admits to urinary frequency and urgency without dysuria or hematuria or flank pain.  No recent fever or chills.  No nausea or vomiting or abdominal pain.  No chest pain or palpitations.  No cough or wheezing or hemoptysis.  ED Course: When she came to the ER, BP was 89/58 with IV hydration went up to 160/87 otherwise vital signs were within normal but later respiratory rate was up to 26.  Labs revealed a CO2 of 16 and a creatinine 1.1 with BUN of 17, anion gap of 22 and albumin 2.8 with total bili of 1.3.  Lactic acid was 4.5 and later 4.3.  CBC showed leukocytosis of 12.6 with neutrophilia and anemia with hemoglobin 10.8 and hematocrit 36.7 compared to 13.5 and 41.9 on 08/15/2020 with currently low RBC indices.  INR is 1.2 and PT 15.4 with PTT 32.  Urinalysis currently pending.. EKG as reviewed by me : EKG showed sinus rhythm with rate of 78 Imaging: Portable chest x-ray showed no acute cardiopulmonary disease.  The patient was given 2 L bolus of IV lactated Ringer followed 125  mL/h, IV cefepime and vancomycin.  She will be admitted to a progressive unit bed for further evaluation and management. PAST MEDICAL HISTORY:   Past Medical History:  Diagnosis Date   Depression    Fainting spell    Frequent headaches    Hyperlipidemia    Hypertension    Kidney lesion, native, right    Migraine    Stroke [facility])    Thyroid disease    UTI (urinary tract infection)   -Breast cancer  PAST SURGICAL HISTORY:   Past Surgical History:  Procedure Laterality Date   LOOP RECORDER INSERTION N/A 02/24/2018   Procedure: LOOP RECORDER INSERTION;  Surgeon: Duke Salvia, MD;  Location: [facility] INVASIVE CV LAB;  Service: Cardiovascular;  Laterality: N/A;   TEE WITHOUT CARDIOVERSION N/A 02/24/2018   Procedure: TRANSESOPHAGEAL ECHOCARDIOGRAM (TEE);  Surgeon: Antonieta Iba, MD;  Location: [facility] ORS;  Service: Cardiovascular;  Laterality: N/A;    SOCIAL HISTORY:   Social History   Tobacco Use   Smoking status: Never   Smokeless tobacco: Never  Substance Use Topics   Alcohol use: Not Currently    FAMILY HISTORY:   Family History  Problem Relation Age of Onset   Dementia Mother    Hypertension Mother    Hypothyroidism Mother    Prostate cancer Father    Breast cancer Sister 36   Hodgkin's lymphoma Sister     DRUG  ALLERGIES:  No Known Allergies  REVIEW OF SYSTEMS:   ROS As per history of present illness. All pertinent systems were reviewed above. Constitutional, HEENT, cardiovascular, respiratory, GI, GU, musculoskeletal, neuro, psychiatric, endocrine, integumentary and hematologic systems were reviewed and are otherwise negative/unremarkable except for positive findings mentioned above in the HPI.   MEDICATIONS AT HOME:   Prior to Admission medications   Medication Sig Start Date End Date Taking? Authorizing Provider  Cholecalciferol (VITAMIN D3) 50 MCG (2000 UT) capsule Take 1 capsule (2,000 Units total) by mouth daily. 03/24/18   Tracey Harries, FNP   Multiple Vitamin (MULTIVITAMIN WITH MINERALS) TABS tablet Take 1 tablet by mouth daily.    [provider]      VITAL SIGNS:  Blood pressure (!) 160/87, pulse 77, temperature 98.2 F (36.8 C), temperature source Oral, resp. rate 20, height 5\' 2"  (1.575 m), weight 63 kg, SpO2 99%.  PHYSICAL EXAMINATION:  Physical Exam  GENERAL:  82 y.o.-year-old female patient lying in the bed with no acute distress.  EYES: Pupils equal, round, reactive to light and accommodation. No scleral icterus. Extraocular muscles intact.  HEENT: Head atraumatic, normocephalic. Oropharynx and nasopharynx clear.  NECK:  Supple, no jugular venous distention. No thyroid enlargement, no tenderness.  LUNGS: Normal breath sounds bilaterally, no wheezing, rales,rhonchi or crepitation. No use of accessory muscles of respiration.  CARDIOVASCULAR: Regular rate and rhythm, S1, S2 normal. No murmurs, rubs, or gallops.  ABDOMEN: Soft, nondistended, nontender. Bowel sounds present. No organomegaly or mass.  EXTREMITIES: No pedal edema, cyanosis, or clubbing.  NEUROLOGIC: Cranial nerves II through XII are intact. Muscle strength 5/5 in all extremities. Sensation intact. Gait not checked.  PSYCHIATRIC: The patient is alert and oriented x 3.  Normal affect and good eye contact. SKIN: No obvious rash, lesion, or ulcer.   LABORATORY PANEL:   CBC Recent Labs  Lab 12/19/22 1833  WBC 12.6*  HGB 10.8*  HCT 36.7  PLT 246   ------------------------------------------------------------------------------------------------------------------  Chemistries  Recent Labs  Lab 12/19/22 1833  NA 139  K 4.0  CL 101  CO2 16*  GLUCOSE 120*  BUN 17  CREATININE 1.10*  CALCIUM 10.0  AST 38  ALT 12  ALKPHOS 85  BILITOT 1.3*   ------------------------------------------------------------------------------------------------------------------  Cardiac Enzymes No results for input(s): "TROPONINI" in the last 168  hours. ------------------------------------------------------------------------------------------------------------------  RADIOLOGY:  CT Head Wo Contrast  Result Date: 12/19/2022 CLINICAL DATA:  Fall at home. Patient is acting confused which is not baseline. Wound on chest. EXAM: CT HEAD WITHOUT CONTRAST CT CERVICAL SPINE WITHOUT CONTRAST TECHNIQUE: Multidetector CT imaging of the head and cervical spine was performed following the standard protocol without intravenous contrast. Multiplanar CT image reconstructions of the cervical spine were also generated. RADIATION DOSE REDUCTION: This exam was performed according to the departmental dose-optimization program which includes automated exposure control, adjustment of the mA and/or kV according to patient size and/or use of iterative reconstruction technique. COMPARISON:  CTA head and neck 07/16/2020 FINDINGS: CT HEAD FINDINGS Brain: No intracranial hemorrhage, mass effect, or evidence of acute infarct. No hydrocephalus. No extra-axial fluid collection. Age related cerebral atrophy and chronic small vessel ischemic disease. Chronic bilateral frontal lobe infarcts. Remote lacunar infarct left cerebellum. Vascular: No hyperdense vessel. Intracranial arterial calcification. Skull: No fracture or focal lesion. Sinuses/Orbits: No acute finding. Other: None. CT CERVICAL SPINE FINDINGS Alignment: No evidence of traumatic malalignment. Loss of lordosis is likely chronic/positional. Skull base and vertebrae: No acute fracture. No primary bone lesion or focal  pathologic process. Soft tissues and spinal canal: No prevertebral fluid or swelling. No visible canal hematoma. Disc levels: Multilevel spondylosis, disc space height loss, and degenerative endplate changes greatest at C5-C6 and C6-C7 where it is advanced. Multilevel facet arthropathy most advanced on the right at C2-C3. Spinal canal narrowing is greatest at C6-C7 secondary to posterior disc osteophyte complex  where it is moderate. Upper chest: No acute abnormality. Other: Carotid calcification. IMPRESSION: 1. No acute intracranial abnormality. Generalized atrophy, small vessel white matter disease, and chronic infarcts. 2. No acute fracture in the cervical spine. Multilevel degenerative spondylosis. Electronically Signed   By: Minerva Fester M.D.   On: 12/19/2022 20:41   CT Cervical Spine Wo Contrast  Result Date: 12/19/2022 CLINICAL DATA:  Fall at home. Patient is acting confused which is not baseline. Wound on chest. EXAM: CT HEAD WITHOUT CONTRAST CT CERVICAL SPINE WITHOUT CONTRAST TECHNIQUE: Multidetector CT imaging of the head and cervical spine was performed following the standard protocol without intravenous contrast. Multiplanar CT image reconstructions of the cervical spine were also generated. RADIATION DOSE REDUCTION: This exam was performed according to the departmental dose-optimization program which includes automated exposure control, adjustment of the mA and/or kV according to patient size and/or use of iterative reconstruction technique. COMPARISON:  CTA head and neck 07/16/2020 FINDINGS: CT HEAD FINDINGS Brain: No intracranial hemorrhage, mass effect, or evidence of acute infarct. No hydrocephalus. No extra-axial fluid collection. Age related cerebral atrophy and chronic small vessel ischemic disease. Chronic bilateral frontal lobe infarcts. Remote lacunar infarct left cerebellum. Vascular: No hyperdense vessel. Intracranial arterial calcification. Skull: No fracture or focal lesion. Sinuses/Orbits: No acute finding. Other: None. CT CERVICAL SPINE FINDINGS Alignment: No evidence of traumatic malalignment. Loss of lordosis is likely chronic/positional. Skull base and vertebrae: No acute fracture. No primary bone lesion or focal pathologic process. Soft tissues and spinal canal: No prevertebral fluid or swelling. No visible canal hematoma. Disc levels: Multilevel spondylosis, disc space height loss,  and degenerative endplate changes greatest at C5-C6 and C6-C7 where it is advanced. Multilevel facet arthropathy most advanced on the right at C2-C3. Spinal canal narrowing is greatest at C6-C7 secondary to posterior disc osteophyte complex where it is moderate. Upper chest: No acute abnormality. Other: Carotid calcification. IMPRESSION: 1. No acute intracranial abnormality. Generalized atrophy, small vessel white matter disease, and chronic infarcts. 2. No acute fracture in the cervical spine. Multilevel degenerative spondylosis. Electronically Signed   By: Minerva Fester M.D.   On: 12/19/2022 20:41   DG Chest Port 1 View  Result Date: 12/19/2022 CLINICAL DATA:  Questionable sepsis - evaluate for abnormality EXAM: PORTABLE CHEST 1 VIEW COMPARISON:  07/15/2020 FINDINGS: Left loop recorder device in place, unchanged. Heart and mediastinal contours are within normal limits. No focal opacities or effusions. No acute bony abnormality. IMPRESSION: No active cardiopulmonary disease. Electronically Signed   By: Charlett Nose M.D.   On: 12/19/2022 20:28      IMPRESSION AND PLAN:  Assessment and Plan: * Sepsis due to undetermined organism (HCC) - She has leukocytosis, tachypnea with hypotension and elevated lactic acid level.  Her presentation is therefore highly suspicious for sepsis.  Her urinalysis is currently pending.-She has associated generalized weakness that could be secondarily and failure to thrive with recent fall without injuries. - Will be admitted to a progressive unit bed. - Will continue broad-spectrum antibiotic therapy with IV cefepime, vancomycin and Flagyl to be de-escalated if his source is found. - We will continue hydration with IV lactated ringer. -  Will monitor lactic acid level. - We will follow blood cultures +/- urine culture.  Failure to thrive in adult This is associated with generalized weakness and recent fall with diminished appetite lately and weight loss. - This could be  certainly aspirated with her breast cancer. - Sepsis would play a role in deterioration of her symptoms as well.  Metabolic acidosis - This is likely secondary to lactic acidosis. - Will follow BMP with hydration.  Coronary artery disease - We will continue her antiplatelet therapy.  History of CVA (cerebrovascular accident) - Will continue Plavix.  Hypothyroidism - We will continue her home Synthroid and check TSH.  Essential hypertension - We will continue her antihypertensive therapy with improvement of her blood pressure as she was hypotensive.. - She will be placed on as needed IV labetalol.    DVT prophylaxis: Lovenox.  Advanced Care Planning:  Code Status: full code.  Family Communication:  The plan of care was discussed in details with the patient (and family). I answered all questions. The patient agreed to proceed with the above mentioned plan. Further management will depend upon hospital course. Disposition Plan: Back to previous home environment Consults called: none.  All the records are reviewed and case discussed with ED provider.  Status is: Inpatient  At the time of the admission, it appears that the appropriate admission status for this patient is inpatient.  This is judged to be reasonable and necessary in order to provide the required intensity of service to ensure the patient's safety given the presenting symptoms, physical exam findings and initial radiographic and laboratory data in the context of comorbid conditions.  The patient requires inpatient status due to high intensity of service, high risk of further deterioration and high frequency of surveillance required.  I certify that at the time of admission, it is my clinical judgment that the patient will require inpatient hospital care extending more than 2 midnights.                            Dispo: The patient is from: Home              Anticipated d/c is to: Home              Patient currently is  not medically stable to d/c.              Difficult to place patient: No  Hannah Beat M.D on 12/19/2022 at 10:41 PM  [facility]   From 7 PM-7 AM, contact night-coverage [email or web address]  CC: Primary care physician; Patient, No Pcp Per

## 2022-12-19 NOTE — Assessment & Plan Note (Signed)
-   We will continue her home Synthroid and check TSH.

## 2022-12-19 NOTE — Assessment & Plan Note (Addendum)
This is associated with generalized weakness and recent fall with diminished appetite lately and weight loss. - This could be certainly aspirated with her breast cancer. - Sepsis would play a role in deterioration of her symptoms as well.

## 2022-12-19 NOTE — ED Provider Notes (Signed)
[facility] Provider Note    Event Date/Time   First MD Initiated Contact with Patient 12/19/22 1813     (approximate)   History   Weakness   HPI  Kaylee [name] is a 82 y.o. female who presents to the urgency department today because of concerns for weakness and a fall.  Some history is obtained by family at bedside.  Apparently for the past couple of months patient has had decreased appetite and intake.  She has had weight loss.  She was diagnosed with a breast cancer a couple of years ago but chose not to be treated.  Family wonders if the cancer could be playing a role in the patient's symptoms.  Today the patient fell and needed help up.  She was very weak.  She denies any recent fevers.   Physical Exam   Triage Vital Signs: ED Triage Vitals  Encounter Vitals Group     BP      Systolic BP Percentile      Diastolic BP Percentile      Pulse      Resp      Temp      Temp src      SpO2      Weight      Height      Head Circumference      Peak Flow      Pain Score      Pain Loc      Pain Education      Exclude from Growth Chart     Most recent vital signs: Vitals:   12/19/22 1835  BP: (!) 89/58  Resp: 16  Temp: 98.2 F (36.8 C)   General: Awake, alert, oriented. CV:  Good peripheral perfusion. Regular rate and rhythm. Resp:  Normal effort. Lungs clear. Abd:  No distention.     ED Results / Procedures / Treatments   Labs (all labs ordered are listed, but only abnormal results are displayed) Labs Reviewed  LACTIC ACID, PLASMA - Abnormal; Notable for the following components:      Result Value   Lactic Acid, Venous 4.5 (*)    All other components within normal limits  LACTIC ACID, PLASMA - Abnormal; Notable for the following components:   Lactic Acid, Venous 4.3 (*)    All other components within normal limits  COMPREHENSIVE METABOLIC PANEL - Abnormal; Notable for the following components:   CO2 16 (*)    Glucose, Bld 120  (*)    Creatinine, Ser 1.10 (*)    Albumin 2.8 (*)    Total Bilirubin 1.3 (*)    GFR, Estimated 50 (*)    Anion gap 22 (*)    All other components within normal limits  CBC WITH DIFFERENTIAL/PLATELET - Abnormal; Notable for the following components:   WBC 12.6 (*)    Hemoglobin 10.8 (*)    MCH 25.7 (*)    MCHC 29.4 (*)    RDW 16.3 (*)    Neutro Abs 11.1 (*)    All other components within normal limits  PROTIME-INR - Abnormal; Notable for the following components:   Prothrombin Time 15.4 (*)    All other components within normal limits  SARS CORONAVIRUS 2 BY RT PCR  CULTURE, BLOOD (ROUTINE X 2)  CULTURE, BLOOD (ROUTINE X 2)  APTT  URINALYSIS, W/ REFLEX TO CULTURE (INFECTION SUSPECTED)     EKG  I, Phineas Semen, attending physician, personally viewed and interpreted this EKG  EKG Time: 1820 Rate: 78 Rhythm: sinus rhythm Axis: normal Intervals: qtc 425 QRS: narrow, q waves v1 ST changes: no st elevation Impression: abnormal ekg   RADIOLOGY I independently interpreted and visualized the CXR. My interpretation: No pneumonia Radiology interpretation:  IMPRESSION:  No active cardiopulmonary disease.   I independently interpreted and visualized the CT head/cervical spine. My interpretation: No acute bleed, no fracture Radiology interpretation:  IMPRESSION:  1. No acute intracranial abnormality. Generalized atrophy, small  vessel white matter disease, and chronic infarcts.  2. No acute fracture in the cervical spine. Multilevel degenerative  spondylosis.      PROCEDURES:  Critical Care performed: Yes  CRITICAL CARE Performed by: Phineas Semen   Total critical care time: 35 minutes  Critical care time was exclusive of separately billable procedures and treating other patients.  Critical care was necessary to treat or prevent imminent or life-threatening deterioration.  Critical care was time spent personally by me on the following activities:  development of treatment plan with patient and/or surrogate as well as nursing, discussions with consultants, evaluation of patient's response to treatment, examination of patient, obtaining history from patient or surrogate, ordering and performing treatments and interventions, ordering and review of laboratory studies, ordering and review of radiographic studies, pulse oximetry and re-evaluation of patient's condition.   Procedures    MEDICATIONS ORDERED IN ED: Medications - No data to display   IMPRESSION / MDM / ASSESSMENT AND PLAN / ED COURSE  I reviewed the triage vital signs and the nursing notes.                              Differential diagnosis includes, but is not limited to, anemia, dehydration, infection, arrythmia  Patient's presentation is most consistent with acute presentation with potential threat to life or bodily function.   The patient is on the cardiac monitor to evaluate for evidence of arrhythmia and/or significant heart rate changes.  Patient presented to the emergency department today because of concerns for weakness.  Per family patient has not been eating or doing well for some time.  Initial vital signs were concerning for significant hypotension.  Broad workup was initiated.  Blood work was concerning for slight leukocytosis and lactic acidosis.  Did have concerns that this could be driven from infection so broad-spectrum antibiotics were ordered.  Additionally patient was given IV fluids.  After IV fluids patient's blood pressure did start improving.  Chest x-ray without obvious pneumonia.  I do think patient would benefit from further workup and management.  Discussed with Dr. Arville Care with the hospitalist service who will evaluate for admission.      FINAL CLINICAL IMPRESSION(S) / ED DIAGNOSES   Final diagnoses:  Weakness  Lactic acidosis     Note:  This document was prepared using Dragon voice recognition software and may include unintentional  dictation errors.    Phineas Semen, MD 12/19/22 2123

## 2022-12-19 NOTE — Assessment & Plan Note (Signed)
-   This is likely secondary to lactic acidosis. - Will follow BMP with hydration.

## 2022-12-19 NOTE — ED Notes (Signed)
Unable to obtain 2nd set of blood cultures. 

## 2022-12-19 NOTE — ED Triage Notes (Addendum)
Patient to ED via [facility] from home. Around 3 PM today patient had a fall at home. Patient did not have LOC. Patient is not currently taking blood thinners. Family called [facility] due to patient acting confused which is not her baseline. Upon arrival of [facility], patient was found to have a wound on her chest. The patient has been seen for this wound previously and was told that it had cancerous cells but patient refused treatment for this. Patient was also seen previously for cardiac issues but denies treatment for this too. Patient has had decreased appetite for several months now, per family report. Patient currently A&O x4 In triage with son who is POA at bedside.

## 2022-12-19 NOTE — Assessment & Plan Note (Signed)
-   She has leukocytosis, tachypnea with hypotension and elevated lactic acid level.  Her presentation is therefore highly suspicious for sepsis.  Her urinalysis is currently pending.-She has associated generalized weakness that could be secondarily and failure to thrive with recent fall without injuries. - Will be admitted to a progressive unit bed. - Will continue broad-spectrum antibiotic therapy with IV cefepime, vancomycin and Flagyl to be de-escalated if his source is found. - We will continue hydration with IV lactated ringer. - Will monitor lactic acid level. - We will follow blood cultures +/- urine culture.

## 2022-12-20 DIAGNOSIS — A419 Sepsis, unspecified organism: Secondary | ICD-10-CM | POA: Diagnosis not present

## 2022-12-20 LAB — BASIC METABOLIC PANEL
Anion gap: 12 (ref 5–15)
BUN: 17 mg/dL (ref 8–23)
CO2: 22 mmol/L (ref 22–32)
Calcium: 9.8 mg/dL (ref 8.9–10.3)
Chloride: 102 mmol/L (ref 98–111)
Creatinine, Ser: 0.94 mg/dL (ref 0.44–1.00)
GFR, Estimated: 60 mL/min — ABNORMAL LOW (ref >60)
Glucose, Bld: 98 mg/dL (ref 70–99)
Potassium: 3.6 mmol/L (ref 3.5–5.1)
Sodium: 136 mmol/L (ref 135–145)

## 2022-12-20 LAB — CORTISOL-AM, BLOOD: Cortisol - AM: 29.2 ug/dL — ABNORMAL HIGH (ref 6.7–22.6)

## 2022-12-20 LAB — TSH: TSH: 1.612 u[IU]/mL (ref 0.350–4.500)

## 2022-12-20 LAB — PROTIME-INR
INR: 1.2 (ref 0.8–1.2)
Prothrombin Time: 14.9 s (ref 11.4–15.2)

## 2022-12-20 LAB — CBC
HCT: 32.9 % — ABNORMAL LOW (ref 36.0–46.0)
Hemoglobin: 9.9 g/dL — ABNORMAL LOW (ref 12.0–15.0)
MCH: 25.7 pg — ABNORMAL LOW (ref 26.0–34.0)
MCHC: 30.1 g/dL (ref 30.0–36.0)
MCV: 85.5 fL (ref 80.0–100.0)
Platelets: 212 10*3/uL (ref 150–400)
RBC: 3.85 MIL/uL — ABNORMAL LOW (ref 3.87–5.11)
RDW: 16.2 % — ABNORMAL HIGH (ref 11.5–15.5)
WBC: 15.5 10*3/uL — ABNORMAL HIGH (ref 4.0–10.5)
nRBC: 0 % (ref 0.0–0.2)

## 2022-12-20 LAB — LACTIC ACID, PLASMA: Lactic Acid, Venous: 2 mmol/L (ref 0.5–1.9)

## 2022-12-20 LAB — PROCALCITONIN: Procalcitonin: 0.35 ng/mL

## 2022-12-20 MED ORDER — AMLODIPINE BESYLATE 5 MG PO TABS
5.0000 mg | ORAL_TABLET | Freq: Every day | ORAL | Status: DC
  Administered 2022-12-20 – 2022-12-21 (×2): 5 mg via ORAL
  Filled 2022-12-20 (×2): qty 1

## 2022-12-20 MED ORDER — SODIUM CHLORIDE 0.9 % IV SOLN
1.0000 g | INTRAVENOUS | Status: DC
  Administered 2022-12-21: 1 g via INTRAVENOUS
  Filled 2022-12-20: qty 10

## 2022-12-20 MED ORDER — ENSURE ENLIVE PO LIQD
237.0000 mL | Freq: Two times a day (BID) | ORAL | Status: DC
  Administered 2022-12-20 – 2022-12-21 (×2): 237 mL via ORAL

## 2022-12-20 NOTE — ED Notes (Signed)
Pt pulling at cords. RN attempting to redirect but pt is not responsive to it.

## 2022-12-20 NOTE — ED Notes (Signed)
  Assisted pt on bed pan to void. Brief and chuck pad changed. Pt repositioned. Call light within reach.

## 2022-12-20 NOTE — Progress Notes (Addendum)
[facility]  - [place] [facility]   PATIENT NAME: Kaylee [name]    MR#:  [id number]  DATE OF BIRTH:  November 23, 1940  SUBJECTIVE:  patient's sister Talbert Forest and son Tinnie Gens at bedside. Patient came in to the emergency room after she had mechanical fall nontraumatic at home. She was a bit confused at home and hands works brought to the emergency room. Currently patient is awake alert she is oriented to person and place. Tells me year is 1970. Cannot remember the month. She has some memory issues per son. Lives with her sister [name] at home. No frequent falls. There is no history of any fever cough. Patient unable to tell me whether she has burning urination  She is sitting up and eating lunch. Vitals remains stable. Patient not taking any meds. Has not seen PCP in a while per son since patient does not like to go to doctors.   VITALS:  Blood pressure 136/75, pulse 73, temperature (!) 97.5 F (36.4 C), temperature source Oral, resp. rate 17, height 5\' 2"  (1.575 m), weight 63 kg, SpO2 100%.  PHYSICAL EXAMINATION:   GENERAL:  82 y.o.-year-old patient with no acute distress.  LUNGS: Normal breath sounds bilaterally, no wheezing CARDIOVASCULAR: S1, S2 normal. No murmur   ABDOMEN: Soft, nontender, nondistended. Bowel sounds present.  EXTREMITIES: No  edema b/l.    NEUROLOGIC: nonfocal  patient is alert and awake, pleasantly confused  LABORATORY PANEL:  CBC Recent Labs  Lab 12/20/22 0517  WBC 15.5*  HGB 9.9*  HCT 32.9*  PLT 212    Chemistries  Recent Labs  Lab 12/19/22 1833 12/20/22 0517  NA 139 136  K 4.0 3.6  CL 101 102  CO2 16* 22  GLUCOSE 120* 98  BUN 17 17  CREATININE 1.10* 0.94  CALCIUM 10.0 9.8  AST 38  --   ALT 12  --   ALKPHOS 85  --   BILITOT 1.3*  --    Cardiac Enzymes No results for input(s): "TROPONINI" in the last 168 hours. RADIOLOGY:  CT Head Wo Contrast  Result Date: 12/19/2022 CLINICAL DATA:  Fall at home. Patient is acting confused  which is not baseline. Wound on chest. EXAM: CT HEAD WITHOUT CONTRAST CT CERVICAL SPINE WITHOUT CONTRAST TECHNIQUE: Multidetector CT imaging of the head and cervical spine was performed following the standard protocol without intravenous contrast. Multiplanar CT image reconstructions of the cervical spine were also generated. RADIATION DOSE REDUCTION: This exam was performed according to the departmental dose-optimization program which includes automated exposure control, adjustment of the mA and/or kV according to patient size and/or use of iterative reconstruction technique. COMPARISON:  CTA head and neck 07/16/2020 FINDINGS: CT HEAD FINDINGS Brain: No intracranial hemorrhage, mass effect, or evidence of acute infarct. No hydrocephalus. No extra-axial fluid collection. Age related cerebral atrophy and chronic small vessel ischemic disease. Chronic bilateral frontal lobe infarcts. Remote lacunar infarct left cerebellum. Vascular: No hyperdense vessel. Intracranial arterial calcification. Skull: No fracture or focal lesion. Sinuses/Orbits: No acute finding. Other: None. CT CERVICAL SPINE FINDINGS Alignment: No evidence of traumatic malalignment. Loss of lordosis is likely chronic/positional. Skull base and vertebrae: No acute fracture. No primary bone lesion or focal pathologic process. Soft tissues and spinal canal: No prevertebral fluid or swelling. No visible canal hematoma. Disc levels: Multilevel spondylosis, disc space height loss, and degenerative endplate changes greatest at C5-C6 and C6-C7 where it is advanced. Multilevel facet arthropathy most advanced on the right at C2-C3. Spinal canal narrowing is  greatest at C6-C7 secondary to posterior disc osteophyte complex where it is moderate. Upper chest: No acute abnormality. Other: Carotid calcification. IMPRESSION: 1. No acute intracranial abnormality. Generalized atrophy, small vessel white matter disease, and chronic infarcts. 2. No acute fracture in the  cervical spine. Multilevel degenerative spondylosis. Electronically Signed   By: Minerva Fester M.D.   On: 12/19/2022 20:41   CT Cervical Spine Wo Contrast  Result Date: 12/19/2022 CLINICAL DATA:  Fall at home. Patient is acting confused which is not baseline. Wound on chest. EXAM: CT HEAD WITHOUT CONTRAST CT CERVICAL SPINE WITHOUT CONTRAST TECHNIQUE: Multidetector CT imaging of the head and cervical spine was performed following the standard protocol without intravenous contrast. Multiplanar CT image reconstructions of the cervical spine were also generated. RADIATION DOSE REDUCTION: This exam was performed according to the departmental dose-optimization program which includes automated exposure control, adjustment of the mA and/or kV according to patient size and/or use of iterative reconstruction technique. COMPARISON:  CTA head and neck 07/16/2020 FINDINGS: CT HEAD FINDINGS Brain: No intracranial hemorrhage, mass effect, or evidence of acute infarct. No hydrocephalus. No extra-axial fluid collection. Age related cerebral atrophy and chronic small vessel ischemic disease. Chronic bilateral frontal lobe infarcts. Remote lacunar infarct left cerebellum. Vascular: No hyperdense vessel. Intracranial arterial calcification. Skull: No fracture or focal lesion. Sinuses/Orbits: No acute finding. Other: None. CT CERVICAL SPINE FINDINGS Alignment: No evidence of traumatic malalignment. Loss of lordosis is likely chronic/positional. Skull base and vertebrae: No acute fracture. No primary bone lesion or focal pathologic process. Soft tissues and spinal canal: No prevertebral fluid or swelling. No visible canal hematoma. Disc levels: Multilevel spondylosis, disc space height loss, and degenerative endplate changes greatest at C5-C6 and C6-C7 where it is advanced. Multilevel facet arthropathy most advanced on the right at C2-C3. Spinal canal narrowing is greatest at C6-C7 secondary to posterior disc osteophyte complex  where it is moderate. Upper chest: No acute abnormality. Other: Carotid calcification. IMPRESSION: 1. No acute intracranial abnormality. Generalized atrophy, small vessel white matter disease, and chronic infarcts. 2. No acute fracture in the cervical spine. Multilevel degenerative spondylosis. Electronically Signed   By: Minerva Fester M.D.   On: 12/19/2022 20:41   DG Chest Port 1 View  Result Date: 12/19/2022 CLINICAL DATA:  Questionable sepsis - evaluate for abnormality EXAM: PORTABLE CHEST 1 VIEW COMPARISON:  07/15/2020 FINDINGS: Left loop recorder device in place, unchanged. Heart and mediastinal contours are within normal limits. No focal opacities or effusions. No acute bony abnormality. IMPRESSION: No active cardiopulmonary disease. Electronically Signed   By: Charlett Nose M.D.   On: 12/19/2022 20:28    Assessment and Plan Tynslee L Neild is a 82 y.o. female with medical history significant for hypertension, dyslipidemia, hypothyroidism, UTI and depression, who presented to the emergency room with acute onset of generalized weakness and fall.  Family stated that for the last couple of months she has been having diminished appetite and p.o. intake and has had weight loss.  She was diagnosed with breast cancer couple years ago and then chose not to be treated.  She was very weak today and fell and could not get up.  She required assistance.    Portable chest x-ray showed no acute cardiopulmonary disease.  Blood culture negative, COVID negative  Severe Sepsis due to undetermined organism (HCC)--source unknown at present - pt came in with leukocytosis, tachypnea with hypotension and elevated lactic acid level.   - She has associated generalized weakness that could be secondarily and failure  to thrive with recent fall without injuries. -- UA positive for UTI. Urine culture pending. -- Will de-escalate antibiotics to IV Rocephin till urine cultures obtained. -- Afebrile, lactic acid trending  down. -continue hydration with IV lactated ringer. - Trend lactic acid level.   Failure to thrive in adult --This is associated with generalized weakness and recent fall with diminished appetite lately and weight loss. - This could be certainly due to her breast cancer. - PT OT to see patient   Metabolic acidosis - This is likely secondary to lactic acidosis. - Resolved after IV hydration    History of CVA (cerebrovascular accident) - pt not on any meds--per son    Hypothyroidism - not on po meds --TSH wnl   Essential hypertension - BP a bit on the higher side --will start low dose Amlodipine    PT/OT to see pt   DVT prophylaxis: Lovenox.  Advanced Care Planning:  Code Status: discussed with patient and son Tinnie Gens patient requests DNR Family Communication:  son and sister Talbert Forest in ER Level of care: Telemetry Medical Status is: Inpatient     TOTAL TIME TAKING CARE OF THIS PATIENT: 35 minutes.  >50% time spent on counselling and coordination of care  Note: This dictation was prepared with Dragon dictation along with smaller phrase technology. Any transcriptional errors that result from this process are unintentional.  Enedina Finner M.D    [facility]   CC: Primary care physician; Patient, No Pcp Per

## 2022-12-20 NOTE — ED Notes (Signed)
Pt again has ripped off heart monitor cords and blood pressure cuff. Everything replaced.

## 2022-12-20 NOTE — Evaluation (Signed)
Physical Therapy Evaluation Patient Details Name: Kaylee [name] MRN: [id number] DOB: [birth year] Today's Date: 12/20/2022  History of Present Illness  Pt is an 82 y.o. female presenting to hospital 12/19/22 with c/o weakness and fall.  Pt admitted with sepsis d/t undetermined organism, FTT, metabolic acidosis.  PMH includes htn, UTI, breast CA, stroke.  Clinical Impression  Prior to recent medical concerns, pt was modified independent ambulating with SPC; lives with her sister in 1 level home with steps to enter; has assist for cooking, driving, and any medication needs.  No c/o pain during session.  Currently pt is SBA with bed mobility; CGA with transfers; and CGA to ambulate 50 feet with RW use (limited distance d/t generalized weakness/fatigue).  Pt would currently benefit from skilled PT to address noted impairments and functional limitations (see below for any additional details).  Upon hospital discharge, pt would benefit from ongoing therapy.     If plan is discharge home, recommend the following: A little help with walking and/or transfers;A little help with bathing/dressing/bathroom;Assistance with cooking/housework;Direct supervision/assist for medications management;Assist for transportation;Help with stairs or ramp for entrance   Can travel by private vehicle    Yes    Equipment Recommendations Rolling walker (2 wheels) (pt's son reports pt has RW at home already)  Recommendations for Other Services       Functional Status Assessment Patient has had a recent decline in their functional status and demonstrates the ability to make significant improvements in function in a reasonable and predictable amount of time.     Precautions / Restrictions Precautions Precautions: Fall Restrictions Weight Bearing Restrictions: No      Mobility  Bed Mobility Overal bed mobility: Needs Assistance Bed Mobility: Supine to Sit, Sit to Supine     Supine to sit: Supervision, HOB  elevated Sit to supine: Supervision, HOB elevated   General bed mobility comments: increased time to perform on own    Transfers Overall transfer level: Needs assistance Equipment used: Rolling walker (2 wheels) Transfers: Sit to/from Stand Sit to Stand: Contact guard assist           General transfer comment: mild increased effort to stand; steady with RW use; vc's for UE placement    Ambulation/Gait Ambulation/Gait assistance: Contact guard assist Gait Distance (Feet): 50 Feet Assistive device: Rolling walker (2 wheels) Gait Pattern/deviations: Trunk flexed, Step-through pattern, Decreased step length - right, Decreased step length - left Gait velocity: decreased     General Gait Details: vc's to stay closer to RW; steady with RW use  Stairs            Wheelchair Mobility     Tilt Bed    Modified Rankin (Stroke Patients Only)       Balance Overall balance assessment: Needs assistance Sitting-balance support: No upper extremity supported, Feet supported Sitting balance-Leahy Scale: Good Sitting balance - Comments: steady reaching within BOS   Standing balance support: Bilateral upper extremity supported, During functional activity, Reliant on assistive device for balance Standing balance-Leahy Scale: Good Standing balance comment: steady ambulating with RW use                             Pertinent Vitals/Pain Pain Assessment Pain Assessment: No/denies pain Pain Intervention(s): Limited activity within patient's tolerance, Monitored during session HR stable and WFL throughout treatment session.    Home Living Family/patient expects to be discharged to:: Private residence Living Arrangements: Other relatives (Pt's sister) Available  Help at Discharge: Family;Available PRN/intermittently Type of Home: House Home Access: Stairs to enter   [employer] of Steps: 3 steps with B railings (unable to reach both) plus 1 STE no  railing   Home Layout: One level Home Equipment: Educational psychologist (2 wheels);Cane - single point;Shower seat      Prior Function Prior Level of Function : Needs assist             Mobility Comments: Modified independent ambulating with SPC; no additional recent falls reported (other than most recent fall) ADLs Comments: Has assist for cooking, driving, and medications     Extremity/Trunk Assessment   Upper Extremity Assessment Upper Extremity Assessment: Overall WFL for tasks assessed    Lower Extremity Assessment Lower Extremity Assessment: Generalized weakness    Cervical / Trunk Assessment Cervical / Trunk Assessment: Normal  Communication   Communication Communication: Difficulty following commands/understanding Following commands: Follows one step commands inconsistently Cueing Techniques: Verbal cues;Visual cues  Cognition Arousal: Alert Behavior During Therapy: WFL for tasks assessed/performed Overall Cognitive Status: History of cognitive impairments - at baseline                                          General Comments General comments (skin integrity, edema, etc.): skin impairment noted upper chest.  Nursing cleared pt for participation in physical therapy.  Pt agreeable to PT session.  Pt's son present during session.    Exercises     Assessment/Plan    PT Assessment Patient needs continued PT services  PT Problem List Decreased strength;Decreased activity tolerance;Decreased balance;Decreased mobility;Decreased knowledge of use of DME       PT Treatment Interventions DME instruction;Gait training;Stair training;Functional mobility training;Therapeutic activities;Therapeutic exercise;Balance training;Patient/family education    PT Goals (Current goals can be found in the Care Plan section)  Acute Rehab PT Goals Patient Stated Goal: to improve strength and mobility PT Goal Formulation: With patient/family Time For Goal  Achievement: 01/03/23 Potential to Achieve Goals: Good    Frequency Min 1X/week     Co-evaluation               AM-PAC PT "6 Clicks" Mobility  Outcome Measure Help needed turning from your back to your side while in a flat bed without using bedrails?: None Help needed moving from lying on your back to sitting on the side of a flat bed without using bedrails?: A Little Help needed moving to and from a bed to a chair (including a wheelchair)?: A Little Help needed standing up from a chair using your arms (e.g., wheelchair or bedside chair)?: A Little Help needed to walk in hospital room?: A Little Help needed climbing 3-5 steps with a railing? : A Little 6 Click Score: 19    End of Session Equipment Utilized During Treatment: Gait belt Activity Tolerance: Patient limited by fatigue Patient left: in bed;with call bell/phone within reach;with bed alarm set;with family/visitor present Nurse Communication: Mobility status;Precautions (via white board) PT Visit Diagnosis: Other abnormalities of gait and mobility (R26.89);Muscle weakness (generalized) (M62.81);History of falling (Z91.81)    Time: 1610-9604 PT Time Calculation (min) (ACUTE ONLY): 23 min   Charges:   PT Evaluation $PT Eval Low Complexity: 1 Low PT Treatments $Therapeutic Activity: 8-22 mins PT General Charges $$ ACUTE PT VISIT: 1 Visit        Hendricks Limes, PT 12/20/22, 4:33 PM

## 2022-12-20 NOTE — ED Notes (Signed)
Pt again ripped off monitoring and L hand PIV. New piv placed.

## 2022-12-20 NOTE — ED Notes (Signed)
Pt again pulled off all her monitoring. RN reapplied.

## 2022-12-20 NOTE — ED Notes (Signed)
Secondary RN went to room dt bed alarm sounding and pt trying to get out of bed. Pt repositioned and bed alarm reset/ Pt also took off monitoring and it was replaced.

## 2022-12-21 ENCOUNTER — Emergency Department: Payer: Medicare Other

## 2022-12-21 ENCOUNTER — Other Ambulatory Visit: Payer: Self-pay

## 2022-12-21 ENCOUNTER — Inpatient Hospital Stay
Admission: EM | Admit: 2022-12-21 | Discharge: 2022-12-23 | DRG: 065 | Disposition: A | Discharge status: Home Health | Payer: Medicare Other | Attending: Internal Medicine | Admitting: Internal Medicine

## 2022-12-21 DIAGNOSIS — Z8673 Personal history of transient ischemic attack (TIA), and cerebral infarction without residual deficits: Secondary | ICD-10-CM

## 2022-12-21 DIAGNOSIS — E876 Hypokalemia: Secondary | ICD-10-CM

## 2022-12-21 DIAGNOSIS — R918 Other nonspecific abnormal finding of lung field: Secondary | ICD-10-CM | POA: Diagnosis not present

## 2022-12-21 DIAGNOSIS — Z66 Do not resuscitate: Secondary | ICD-10-CM | POA: Diagnosis present

## 2022-12-21 DIAGNOSIS — Z807 Family history of other malignant neoplasms of lymphoid, hematopoietic and related tissues: Secondary | ICD-10-CM

## 2022-12-21 DIAGNOSIS — F32A Depression, unspecified: Secondary | ICD-10-CM | POA: Diagnosis present

## 2022-12-21 DIAGNOSIS — E785 Hyperlipidemia, unspecified: Secondary | ICD-10-CM | POA: Diagnosis present

## 2022-12-21 DIAGNOSIS — Z8042 Family history of malignant neoplasm of prostate: Secondary | ICD-10-CM

## 2022-12-21 DIAGNOSIS — R911 Solitary pulmonary nodule: Secondary | ICD-10-CM | POA: Diagnosis present

## 2022-12-21 DIAGNOSIS — N390 Urinary tract infection, site not specified: Secondary | ICD-10-CM | POA: Diagnosis present

## 2022-12-21 DIAGNOSIS — I6523 Occlusion and stenosis of bilateral carotid arteries: Secondary | ICD-10-CM | POA: Diagnosis not present

## 2022-12-21 DIAGNOSIS — D630 Anemia in neoplastic disease: Secondary | ICD-10-CM | POA: Diagnosis present

## 2022-12-21 DIAGNOSIS — I10 Essential (primary) hypertension: Secondary | ICD-10-CM | POA: Diagnosis present

## 2022-12-21 DIAGNOSIS — I2510 Atherosclerotic heart disease of native coronary artery without angina pectoris: Secondary | ICD-10-CM | POA: Diagnosis present

## 2022-12-21 DIAGNOSIS — I6330 Cerebral infarction due to thrombosis of unspecified cerebral artery: Principal | ICD-10-CM | POA: Diagnosis present

## 2022-12-21 DIAGNOSIS — R29708 NIHSS score 8: Secondary | ICD-10-CM | POA: Diagnosis present

## 2022-12-21 DIAGNOSIS — R0689 Other abnormalities of breathing: Secondary | ICD-10-CM | POA: Diagnosis not present

## 2022-12-21 DIAGNOSIS — E039 Hypothyroidism, unspecified: Secondary | ICD-10-CM | POA: Diagnosis present

## 2022-12-21 DIAGNOSIS — R404 Transient alteration of awareness: Secondary | ICD-10-CM | POA: Diagnosis not present

## 2022-12-21 DIAGNOSIS — R55 Syncope and collapse: Principal | ICD-10-CM | POA: Diagnosis present

## 2022-12-21 DIAGNOSIS — I639 Cerebral infarction, unspecified: Secondary | ICD-10-CM | POA: Diagnosis not present

## 2022-12-21 DIAGNOSIS — I63522 Cerebral infarction due to unspecified occlusion or stenosis of left anterior cerebral artery: Secondary | ICD-10-CM

## 2022-12-21 DIAGNOSIS — I611 Nontraumatic intracerebral hemorrhage in hemisphere, cortical: Secondary | ICD-10-CM | POA: Diagnosis not present

## 2022-12-21 DIAGNOSIS — E872 Acidosis, unspecified: Secondary | ICD-10-CM | POA: Diagnosis not present

## 2022-12-21 DIAGNOSIS — I700 Atherosclerosis of aorta: Secondary | ICD-10-CM | POA: Diagnosis present

## 2022-12-21 DIAGNOSIS — Z79899 Other long term (current) drug therapy: Secondary | ICD-10-CM

## 2022-12-21 DIAGNOSIS — Z7902 Long term (current) use of antithrombotics/antiplatelets: Secondary | ICD-10-CM

## 2022-12-21 DIAGNOSIS — Z6836 Body mass index (BMI) 36.0-36.9, adult: Secondary | ICD-10-CM | POA: Diagnosis not present

## 2022-12-21 DIAGNOSIS — R4781 Slurred speech: Secondary | ICD-10-CM | POA: Diagnosis present

## 2022-12-21 DIAGNOSIS — R0902 Hypoxemia: Secondary | ICD-10-CM | POA: Diagnosis not present

## 2022-12-21 DIAGNOSIS — R627 Adult failure to thrive: Secondary | ICD-10-CM | POA: Diagnosis present

## 2022-12-21 DIAGNOSIS — R000 Tachycardia, unspecified: Secondary | ICD-10-CM | POA: Diagnosis not present

## 2022-12-21 DIAGNOSIS — Z1152 Encounter for screening for COVID-19: Secondary | ICD-10-CM | POA: Diagnosis not present

## 2022-12-21 DIAGNOSIS — F0390 Unspecified dementia without behavioral disturbance: Secondary | ICD-10-CM | POA: Diagnosis present

## 2022-12-21 DIAGNOSIS — R4182 Altered mental status, unspecified: Secondary | ICD-10-CM | POA: Diagnosis not present

## 2022-12-21 DIAGNOSIS — I959 Hypotension, unspecified: Secondary | ICD-10-CM | POA: Diagnosis present

## 2022-12-21 DIAGNOSIS — I771 Stricture of artery: Secondary | ICD-10-CM | POA: Diagnosis not present

## 2022-12-21 DIAGNOSIS — G9340 Encephalopathy, unspecified: Secondary | ICD-10-CM

## 2022-12-21 DIAGNOSIS — Z803 Family history of malignant neoplasm of breast: Secondary | ICD-10-CM | POA: Diagnosis not present

## 2022-12-21 DIAGNOSIS — R4702 Dysphasia: Secondary | ICD-10-CM | POA: Diagnosis present

## 2022-12-21 DIAGNOSIS — C787 Secondary malignant neoplasm of liver and intrahepatic bile duct: Secondary | ICD-10-CM | POA: Diagnosis present

## 2022-12-21 DIAGNOSIS — I252 Old myocardial infarction: Secondary | ICD-10-CM

## 2022-12-21 DIAGNOSIS — Z8249 Family history of ischemic heart disease and other diseases of the circulatory system: Secondary | ICD-10-CM | POA: Diagnosis not present

## 2022-12-21 DIAGNOSIS — A419 Sepsis, unspecified organism: Secondary | ICD-10-CM | POA: Diagnosis not present

## 2022-12-21 LAB — CBC WITH DIFFERENTIAL/PLATELET
Abs Immature Granulocytes: 0.1 10*3/uL — ABNORMAL HIGH (ref 0.00–0.07)
Basophils Absolute: 0 10*3/uL (ref 0.0–0.1)
Basophils Relative: 0 %
Eosinophils Absolute: 0 10*3/uL (ref 0.0–0.5)
Eosinophils Relative: 0 %
HCT: 30.9 % — ABNORMAL LOW (ref 36.0–46.0)
Hemoglobin: 9.5 g/dL — ABNORMAL LOW (ref 12.0–15.0)
Immature Granulocytes: 1 %
Lymphocytes Relative: 11 %
Lymphs Abs: 1.2 10*3/uL (ref 0.7–4.0)
MCH: 26.2 pg (ref 26.0–34.0)
MCHC: 30.7 g/dL (ref 30.0–36.0)
MCV: 85.4 fL (ref 80.0–100.0)
Monocytes Absolute: 0.7 10*3/uL (ref 0.1–1.0)
Monocytes Relative: 7 %
Neutro Abs: 9.2 10*3/uL — ABNORMAL HIGH (ref 1.7–7.7)
Neutrophils Relative %: 81 %
Platelets: 240 10*3/uL (ref 150–400)
RBC: 3.62 MIL/uL — ABNORMAL LOW (ref 3.87–5.11)
RDW: 16 % — ABNORMAL HIGH (ref 11.5–15.5)
WBC: 11.2 10*3/uL — ABNORMAL HIGH (ref 4.0–10.5)
nRBC: 0 % (ref 0.0–0.2)

## 2022-12-21 LAB — COMPREHENSIVE METABOLIC PANEL
ALT: 16 U/L (ref 0–44)
AST: 51 U/L — ABNORMAL HIGH (ref 15–41)
Albumin: 2.6 g/dL — ABNORMAL LOW (ref 3.5–5.0)
Alkaline Phosphatase: 83 U/L (ref 38–126)
Anion gap: 14 (ref 5–15)
BUN: 18 mg/dL (ref 8–23)
CO2: 23 mmol/L (ref 22–32)
Calcium: 10.1 mg/dL (ref 8.9–10.3)
Chloride: 98 mmol/L (ref 98–111)
Creatinine, Ser: 1.2 mg/dL — ABNORMAL HIGH (ref 0.44–1.00)
GFR, Estimated: 45 mL/min — ABNORMAL LOW (ref >60)
Glucose, Bld: 162 mg/dL — ABNORMAL HIGH (ref 70–99)
Potassium: 3.4 mmol/L — ABNORMAL LOW (ref 3.5–5.1)
Sodium: 135 mmol/L (ref 135–145)
Total Bilirubin: 0.6 mg/dL (ref 0.3–1.2)
Total Protein: 6.7 g/dL (ref 6.5–8.1)

## 2022-12-21 LAB — URINALYSIS, ROUTINE W REFLEX MICROSCOPIC
Bilirubin Urine: NEGATIVE
Glucose, UA: NEGATIVE mg/dL
Hgb urine dipstick: NEGATIVE
Ketones, ur: 5 mg/dL — AB
Leukocytes,Ua: NEGATIVE
Nitrite: NEGATIVE
Protein, ur: NEGATIVE mg/dL
Specific Gravity, Urine: 1.012 (ref 1.005–1.030)
pH: 5 (ref 5.0–8.0)

## 2022-12-21 LAB — PROCALCITONIN: Procalcitonin: 0.33 ng/mL

## 2022-12-21 LAB — GLUCOSE, CAPILLARY: Glucose-Capillary: 95 mg/dL (ref 70–99)

## 2022-12-21 LAB — LACTIC ACID, PLASMA
Lactic Acid, Venous: 4.5 mmol/L (ref 0.5–1.9)
Lactic Acid, Venous: 2.8 mmol/L (ref 0.5–1.9)

## 2022-12-21 LAB — SARS CORONAVIRUS 2 BY RT PCR: SARS Coronavirus 2 by RT PCR: NEGATIVE

## 2022-12-21 LAB — PROTIME-INR
INR: 1.2 (ref 0.8–1.2)
Prothrombin Time: 15.5 s — ABNORMAL HIGH (ref 11.4–15.2)

## 2022-12-21 LAB — APTT: aPTT: 31 s (ref 24–36)

## 2022-12-21 MED ORDER — HALOPERIDOL LACTATE 5 MG/ML IJ SOLN
2.5000 mg | Freq: Once | INTRAMUSCULAR | Status: AC
  Administered 2022-12-21: 2.5 mg via INTRAVENOUS
  Filled 2022-12-21: qty 1

## 2022-12-21 MED ORDER — SODIUM CHLORIDE 0.9 % IV SOLN: 2.0000 g | Freq: Once | INTRAVENOUS | Status: DC

## 2022-12-21 MED ORDER — METRONIDAZOLE 500 MG/100ML IV SOLN
500.0000 mg | Freq: Two times a day (BID) | INTRAVENOUS | Status: DC
  Administered 2022-12-22: 500 mg via INTRAVENOUS
  Filled 2022-12-21 (×2): qty 100

## 2022-12-21 MED ORDER — CEPHALEXIN 500 MG PO CAPS: 500.0000 mg | ORAL_CAPSULE | Freq: Two times a day (BID) | ORAL | 0 refills | Status: DC

## 2022-12-21 MED ORDER — SODIUM CHLORIDE 0.9 % IV BOLUS
1000.0000 mL | Freq: Once | INTRAVENOUS | Status: AC
  Administered 2022-12-21: 1000 mL via INTRAVENOUS

## 2022-12-21 MED ORDER — ENOXAPARIN SODIUM 40 MG/0.4ML IJ SOSY
40.0000 mg | PREFILLED_SYRINGE | INTRAMUSCULAR | Status: DC
  Administered 2022-12-22 – 2022-12-23 (×2): 40 mg via SUBCUTANEOUS
  Filled 2022-12-21 (×2): qty 0.4

## 2022-12-21 MED ORDER — POTASSIUM CHLORIDE 20 MEQ PO PACK
40.0000 meq | PACK | Freq: Once | ORAL | Status: AC
  Administered 2022-12-22: 40 meq via ORAL
  Filled 2022-12-21: qty 2

## 2022-12-21 MED ORDER — VANCOMYCIN HCL IN DEXTROSE 1-5 GM/200ML-% IV SOLN: 1000.0000 mg | Freq: Once | INTRAVENOUS | Status: DC

## 2022-12-21 MED ORDER — VANCOMYCIN HCL 1250 MG/250ML IV SOLN
1250.0000 mg | Freq: Once | INTRAVENOUS | Status: AC
  Administered 2022-12-21: 1250 mg via INTRAVENOUS
  Filled 2022-12-21: qty 250

## 2022-12-21 MED ORDER — ENSURE ENLIVE PO LIQD: 237.0000 mL | Freq: Two times a day (BID) | ORAL | 12 refills | Status: DC

## 2022-12-21 MED ORDER — SODIUM CHLORIDE 0.9 % IV SOLN
2.0000 g | Freq: Once | INTRAVENOUS | Status: AC
  Administered 2022-12-21: 2 g via INTRAVENOUS
  Filled 2022-12-21: qty 12.5

## 2022-12-21 MED ORDER — TRAZODONE HCL 50 MG PO TABS: 25.0000 mg | ORAL_TABLET | Freq: Every evening | ORAL | Status: DC | PRN

## 2022-12-21 MED ORDER — CEPHALEXIN 500 MG PO CAPS: 500.0000 mg | ORAL_CAPSULE | Freq: Two times a day (BID) | ORAL | Status: DC

## 2022-12-21 MED ORDER — AMLODIPINE BESYLATE 5 MG PO TABS: 5.0000 mg | ORAL_TABLET | Freq: Every day | ORAL | 0 refills | Status: DC

## 2022-12-21 MED ORDER — LACTATED RINGERS IV SOLN: 150.0000 mL/h | INTRAVENOUS | Status: DC

## 2022-12-21 MED ORDER — SODIUM CHLORIDE 0.9 % IV SOLN
2.0000 g | Freq: Two times a day (BID) | INTRAVENOUS | Status: DC
  Filled 2022-12-21: qty 12.5

## 2022-12-21 MED ORDER — SODIUM CHLORIDE 0.9 % IV SOLN: INTRAVENOUS | Status: DC

## 2022-12-21 MED ORDER — ACETAMINOPHEN 650 MG RE SUPP: 650.0000 mg | Freq: Four times a day (QID) | RECTAL | Status: DC | PRN

## 2022-12-21 MED ORDER — ONDANSETRON HCL 4 MG PO TABS: 4.0000 mg | ORAL_TABLET | Freq: Four times a day (QID) | ORAL | Status: DC | PRN

## 2022-12-21 MED ORDER — LACTATED RINGERS IV BOLUS
1000.0000 mL | Freq: Once | INTRAVENOUS | Status: AC
  Administered 2022-12-21: 1000 mL via INTRAVENOUS

## 2022-12-21 MED ORDER — SODIUM CHLORIDE 0.9% FLUSH
3.0000 mL | Freq: Two times a day (BID) | INTRAVENOUS | Status: DC
  Administered 2022-12-22 – 2022-12-23 (×4): 3 mL via INTRAVENOUS

## 2022-12-21 MED ORDER — IOHEXOL 350 MG/ML SOLN
75.0000 mL | Freq: Once | INTRAVENOUS | Status: AC | PRN
  Administered 2022-12-21: 75 mL via INTRAVENOUS

## 2022-12-21 MED ORDER — ACETAMINOPHEN 325 MG PO TABS: 650.0000 mg | ORAL_TABLET | Freq: Four times a day (QID) | ORAL | Status: DC | PRN

## 2022-12-21 MED ORDER — MAGNESIUM HYDROXIDE 400 MG/5ML PO SUSP: 30.0000 mL | Freq: Every day | ORAL | Status: DC | PRN

## 2022-12-21 MED ORDER — VANCOMYCIN HCL 1250 MG/250ML IV SOLN: 1250.0000 mg | INTRAVENOUS | Status: DC

## 2022-12-21 MED ORDER — ONDANSETRON HCL 4 MG/2ML IJ SOLN: 4.0000 mg | Freq: Four times a day (QID) | INTRAMUSCULAR | Status: DC | PRN

## 2022-12-21 NOTE — ED Triage Notes (Signed)
Patient presents to er via [facility] from home with altered mental status per family. Patient was discharged today from this facitility. Family stated that she was not "acting normal." Once EMS arrived patient was found to be unresponsive and EMS sternal rubbed patient and patient woke up. Originally pressure was 76/51 with EMS, 500 NS bolus given. Last pressure 100/70 manual.   EMS STATs Cbg 169 First pressure for EMS 76/51 Manual pressure 100/70 Nsr w pvc 20g lfa 500 NS bolus 74 pulse Rr 21 Co2 30 90-96 oxygen RA

## 2022-12-21 NOTE — TOC CM/SW Note (Signed)
Call to son to discuss [facility] recommendation. Left VM requesting a return call.  Alfonso Ramus, LCSW Transitions of Care Department [phone]

## 2022-12-21 NOTE — Discharge Instructions (Addendum)
Family to call pt's previous PCP office to see if she can be accepted. Some PCP options in [place] area- not a comprehensive list  [facility]- [phone] [facility]- [phone] [facility]- [phone] [facility]- [phone] [facility]- [phone] [facility]- [phone]  or [facility] Physician Referral Line [phone]

## 2022-12-21 NOTE — ED Notes (Signed)
Family in at bedside and stated that the pt is basically at her baseline.

## 2022-12-21 NOTE — H&P (Incomplete)
[place]   PATIENT NAME: Kaylee [name]    MR#:  [id number]  DATE OF BIRTH:  02/16/1941  DATE OF ADMISSION:  12/21/2022  PRIMARY CARE PHYSICIAN: Patient, No Pcp Per   Patient is coming from: Home.  REQUESTING/REFERRING PHYSICIAN:  Shaune Pollack, MD  CHIEF COMPLAINT:   Chief Complaint  Patient presents with   Altered Mental Status    HISTORY OF PRESENT ILLNESS:  Kaylee [name] is a 82 y.o. female with medical history significant for  hypertension, dyslipidemia, hypothyroidism, UTI and depression, who presented to the emergency room with acute onset of syncope after she was discharged earlier this afternoon.  The patient apparently slumped down and was unresponsive.  Her blood pressure was 70s over 50s.  She was confused and was having expressive dysphasia.  The patient was just admitted here on 9/5 for management of severe sepsis due to undetermined organism with unknown source and failure to thrive as well as metabolic acidosis due to lactic acidosis and was discharged home today with home health with clinical improvement.  No reported chest pain or palpitations.  No reported cough or wheezing or dyspnea.  No dysuria, oliguria, urinary frequency or urgency or flank pain.  ED Course: When she came to the ER BP was 145/77 with otherwise normal vital signs.  Labs revealed mild hypokalemia 3.4, AST 51 albumin 2.6 with otherwise unremarkable CMP.  Lactic acid was 4.5 up from 2 and later on 2.8 this evening.  Procalcitonin was 0.33 compared to 0.35 yesterday.  CBC showed WBC of 11.2 down from 15.5 yesterday with mild neutrophilia and anemia close to baseline.  COVID-19 PCR came back negative UA was unremarkable blood and urine culture was sent. EKG as reviewed by me : None Imaging: Noncontrasted chest CT revealed the following: 1. No evidence of pneumonia. 2. Multiple suspicious lesions of the right hepatic lobe, which are new when compared with prior CT of the abdomen and pelvis  July 15, 2020, likely metastatic disease given history of primary breast malignancy. 3. Calcified soft tissue mass of the medial right breast, possibly primary breast malignancy. Correlate with clinical history. 4. Nonspecific small solid nodules of the right upper and lower lobes measuring 5 mm. Recommend attention on follow-up per oncologic clinical protocol. 5. Aortic Atherosclerosis.  CTA of the head and neck revealed the following: 1. Negative CTA for large vessel occlusion or other emergent finding. 2. Mild for age atheromatous change about the carotid bifurcations and carotid siphons without hemodynamically significant or correctable stenosis. 3. Diffuse tortuosity of the major arterial vasculature of the head and neck, suggesting chronic underlying hypertension. 4. 5 mm right upper lobe pulmonary nodule, indeterminate. Per Fleischner Society Guidelines, if patient is low risk for malignancy, no routine follow-up imaging is recommended. If patient is high risk for malignancy, a non-contrast Chest CT at 12 months is optional. If performed and the nodule is stable at 12 months, no further follow-up is recommended. These guidelines do not apply to immunocompromised patients and patients with cancer. Follow up in patients with significant comorbidities as clinically warranted. For lung cancer screening, adhere to Lung-RADS guidelines. Reference: Radiology. 2017; 284(1):228-43.  The patient was given IV vancomycin and cefepime as well as 1 L bolus of IV normal saline 1 L bolus of IV lactated Ringer and 2.5 mg of IV Haldol.  She will be admitted to a medical telemetry observation bed for further evaluation and management.  PAST MEDICAL HISTORY:   Past Medical History:  Diagnosis Date   Depression    Fainting spell    Frequent headaches    Hyperlipidemia    Hypertension    Kidney lesion, native, right    Migraine    Stroke [facility])    Thyroid disease    UTI (urinary tract  infection)     PAST SURGICAL HISTORY:   Past Surgical History:  Procedure Laterality Date   LOOP RECORDER INSERTION N/A 02/24/2018   Procedure: LOOP RECORDER INSERTION;  Surgeon: Duke Salvia, MD;  Location: [facility] INVASIVE CV LAB;  Service: Cardiovascular;  Laterality: N/A;   TEE WITHOUT CARDIOVERSION N/A 02/24/2018   Procedure: TRANSESOPHAGEAL ECHOCARDIOGRAM (TEE);  Surgeon: Antonieta Iba, MD;  Location: [facility] ORS;  Service: Cardiovascular;  Laterality: N/A;    SOCIAL HISTORY:   Social History   Tobacco Use   Smoking status: Never   Smokeless tobacco: Never  Substance Use Topics   Alcohol use: Not Currently    FAMILY HISTORY:   Family History  Problem Relation Age of Onset   Dementia Mother    Hypertension Mother    Hypothyroidism Mother    Prostate cancer Father    Breast cancer Sister 37   Hodgkin's lymphoma Sister     DRUG ALLERGIES:  No Known Allergies  REVIEW OF SYSTEMS:   ROS As per history of present illness. All pertinent systems were reviewed above. Constitutional, HEENT, cardiovascular, respiratory, GI, GU, musculoskeletal, neuro, psychiatric, endocrine, integumentary and hematologic systems were reviewed and are otherwise negative/unremarkable except for positive findings mentioned above in the HPI.   MEDICATIONS AT HOME:   Prior to Admission medications   Medication Sig Start Date End Date Taking? Authorizing Provider  amLODipine (NORVASC) 5 MG tablet Take 1 tablet (5 mg total) by mouth daily. 12/22/22  Yes Enedina Finner, MD  cephALEXin (KEFLEX) 500 MG capsule Take 1 capsule (500 mg total) by mouth every 12 (twelve) hours for 2 days. 12/22/22 12/24/22 Yes Enedina Finner, MD  Cholecalciferol (VITAMIN D3) 50 MCG (2000 UT) capsule Take 1 capsule (2,000 Units total) by mouth daily. 03/24/18  Yes Guse, Janna Arch, FNP  Multiple Vitamin (MULTIVITAMIN WITH MINERALS) TABS tablet Take 1 tablet by mouth daily.   Yes [provider]  feeding supplement (ENSURE  ENLIVE / ENSURE PLUS) LIQD Take 237 mLs by mouth 2 (two) times daily between meals. 12/21/22   Enedina Finner, MD      VITAL SIGNS:  Blood pressure (!) 91/56, pulse 80, temperature 98.4 F (36.9 C), temperature source Rectal, resp. rate 18, height 5\' 2"  (1.575 m), weight 61.2 kg, SpO2 100%.  PHYSICAL EXAMINATION:  Physical Exam  GENERAL:  82 y.o.-year-old patient lying in the bed with no acute distress.  EYES: Pupils equal, round, reactive to light and accommodation. No scleral icterus. Extraocular muscles intact.  HEENT: Head atraumatic, normocephalic. Oropharynx and nasopharynx clear.  NECK:  Supple, no jugular venous distention. No thyroid enlargement, no tenderness.  LUNGS: Normal breath sounds bilaterally, no wheezing, rales,rhonchi or crepitation. No use of accessory muscles of respiration.  CARDIOVASCULAR: Regular rate and rhythm, S1, S2 normal. No murmurs, rubs, or gallops.  ABDOMEN: Soft, nondistended, nontender. Bowel sounds present. No organomegaly or mass.  EXTREMITIES: No pedal edema, cyanosis, or clubbing.  NEUROLOGIC: Cranial nerves II through XII are intact. Muscle strength 5/5 in all extremities. Sensation intact. Gait not checked.  PSYCHIATRIC: The patient is alert and oriented x 3.  Normal affect and good eye contact. SKIN: No obvious rash, lesion, or ulcer.   LABORATORY  PANEL:   CBC Recent Labs  Lab 12/21/22 1924  WBC 11.2*  HGB 9.5*  HCT 30.9*  PLT 240   ------------------------------------------------------------------------------------------------------------------  Chemistries  Recent Labs  Lab 12/21/22 1924  NA 135  K 3.4*  CL 98  CO2 23  GLUCOSE 162*  BUN 18  CREATININE 1.20*  CALCIUM 10.1  AST 51*  ALT 16  ALKPHOS 83  BILITOT 0.6   ------------------------------------------------------------------------------------------------------------------  Cardiac Enzymes No results for input(s): "TROPONINI" in the last 168  hours. ------------------------------------------------------------------------------------------------------------------  RADIOLOGY:  CT ANGIO HEAD NECK W WO CM  Result Date: 12/21/2022 CLINICAL DATA:  Initial evaluation for neuro deficit, stroke suspected. EXAM: CT ANGIOGRAPHY HEAD AND NECK WITH AND WITHOUT CONTRAST TECHNIQUE: Multidetector CT imaging of the head and neck was performed using the standard protocol during bolus administration of intravenous contrast. Multiplanar CT image reconstructions and MIPs were obtained to evaluate the vascular anatomy. Carotid stenosis measurements (when applicable) are obtained utilizing NASCET criteria, using the distal internal carotid diameter as the denominator. RADIATION DOSE REDUCTION: This exam was performed according to the departmental dose-optimization program which includes automated exposure control, adjustment of the mA and/or kV according to patient size and/or use of iterative reconstruction technique. CONTRAST:  75mL OMNIPAQUE IOHEXOL 350 MG/ML SOLN COMPARISON:  CT from earlier the same day as well as prior CTA from 08/12/2020. FINDINGS: CTA NECK FINDINGS Aortic arch: Visualized aortic arch within normal limits for caliber with standard branch pattern. No stenosis about the origin of the great vessels. Right carotid system: Right common and internal carotid arteries are tortuous without evidence for dissection. Mild for age atheromatous change about the right carotid bulb without hemodynamically significant stenosis. Left carotid system: Left common and internal carotid arteries are tortuous but patent without dissection. Mild for age atheromatous change about the left carotid bulb without hemodynamically significant stenosis. Vertebral arteries: Both vertebral arteries arise from subclavian arteries. No proximal subclavian artery stenosis. Vertebral arteries patent without stenosis or dissection. Skeleton: No discrete or worrisome osseous lesions.  Reversal of the normal cervical lordosis with moderate to advanced spondylosis at C4-5 through C6-7. Few scattered chronic compression deformities noted within the visualized upper thoracic spine. Other neck: No other acute finding. Upper chest: 5 mm right upper lobe pulmonary nodule (series 2, image 145), indeterminate. No other acute finding. Review of the MIP images confirms the above findings CTA HEAD FINDINGS Anterior circulation: Mild atheromatous change about the carotid siphons without stenosis. A1 segments, anterior communicating complex common anterior cerebral arteries patent without stenosis. No M1 stenosis or occlusion. Distal MCA branches perfused and symmetric. Posterior circulation: Both V4 segments patent without stenosis. Right PICA patent. Left PICA not well seen. Basilar patent without stenosis. Superior cerebellar and posterior cerebral arteries patent bilaterally. Venous sinuses: Grossly patent allowing for timing the contrast bolus. Anatomic variants: None significant.  No aneurysm. Review of the MIP images confirms the above findings IMPRESSION: 1. Negative CTA for large vessel occlusion or other emergent finding. 2. Mild for age atheromatous change about the carotid bifurcations and carotid siphons without hemodynamically significant or correctable stenosis. 3. Diffuse tortuosity of the major arterial vasculature of the head and neck, suggesting chronic underlying hypertension. 4. 5 mm right upper lobe pulmonary nodule, indeterminate. Per Fleischner Society Guidelines, if patient is low risk for malignancy, no routine follow-up imaging is recommended. If patient is high risk for malignancy, a non-contrast Chest CT at 12 months is optional. If performed and the nodule is stable at 12 months, no further follow-up  is recommended. These guidelines do not apply to immunocompromised patients and patients with cancer. Follow up in patients with significant comorbidities as clinically warranted. For  lung cancer screening, adhere to Lung-RADS guidelines. Reference: Radiology. 2017; 284(1):228-43. Electronically Signed   By: Rise Mu M.D.   On: 12/21/2022 22:19   CT CHEST WO CONTRAST  Result Date: 12/21/2022 CLINICAL DATA:  Episodes of recurrent sepsis; neurological deficit; history of breast cancer EXAM: CT CHEST WITHOUT CONTRAST TECHNIQUE: Multidetector CT imaging of the chest was performed following the standard protocol without IV contrast. RADIATION DOSE REDUCTION: This exam was performed according to the departmental dose-optimization program which includes automated exposure control, adjustment of the mA and/or kV according to patient size and/or use of iterative reconstruction technique. COMPARISON:  CT abdomen and pelvis dated July 15, 2020 FINDINGS: Cardiovascular: Normal heart size. No pericardial effusion. Normal caliber thoracic aorta with mild calcified plaque. No coronary artery calcifications. Mediastinum/Nodes: Esophagus and thyroid are unremarkable. No enlarged lymph nodes seen in the chest. Lungs/Pleura: Central airways are patent. No consolidation, pleural effusion or pneumothorax. Small solid nodule of the right upper lobe measuring 5 mm on series 5, image 37. small solid nodule of the right lower lobe measuring 5 mm on image 77. Upper Abdomen: Multiple new suspicious lesions of the right hepatic lobe. Largest of the right hepatic dome measures 4.0 x 3.5 cm on series 4, image 104. Simple appearing cyst of the right kidney. Musculoskeletal: Medial right breast measuring 2.9 x 1.6 x 4.9 cm. No suspicious bone lesions identified. IMPRESSION: 1. No evidence of pneumonia. 2. Multiple suspicious lesions of the right hepatic lobe, which are new when compared with prior CT of the abdomen and pelvis July 15, 2020, likely metastatic disease given history of primary breast malignancy. 3. Calcified soft tissue mass of the medial right breast, possibly primary breast malignancy. Correlate  with clinical history. 4. Nonspecific small solid nodules of the right upper and lower lobes measuring 5 mm. Recommend attention on follow-up per oncologic clinical protocol. 5. Aortic Atherosclerosis (ICD10-I70.0). Electronically Signed   By: Allegra Lai M.D.   On: 12/21/2022 22:06   CT HEAD CODE STROKE WO CONTRAST  Result Date: 12/21/2022 CLINICAL DATA:  Code stroke. Initial evaluation for neuro deficit, stroke suspected, speech changes. EXAM: CT HEAD WITHOUT CONTRAST TECHNIQUE: Contiguous axial images were obtained from the base of the skull through the vertex without intravenous contrast. RADIATION DOSE REDUCTION: This exam was performed according to the departmental dose-optimization program which includes automated exposure control, adjustment of the mA and/or kV according to patient size and/or use of iterative reconstruction technique. COMPARISON:  Prior study from 12/19/2022. FINDINGS: Brain: Age-related cerebral atrophy with chronic small vessel ischemic disease. Multifocal chronic cortical infarcts involving the bilateral frontal lobes. Small remote left cerebellar infarct. No acute intracranial hemorrhage. No acute large vessel territory infarct. No mass lesion, midline shift or mass effect. No hydrocephalus or extra-axial fluid collection. Vascular: No abnormal hyperdense vessel. Scattered calcified atherosclerosis about the skull base. Skull: Scalp soft tissues within normal limits.  Calvarium intact. Sinuses/Orbits: Globes and orbital soft tissues within normal limits. Paranasal sinuses are clear. No mastoid effusion. Other: None. ASPECTS [facility] Stroke Program Early CT Score) - Ganglionic level infarction (caudate, lentiform nuclei, internal capsule, insula, M1-M3 cortex): 7 - Supraganglionic infarction (M4-M6 cortex): 3 Total score (0-10 with 10 being normal): 10 IMPRESSION: 1. No acute intracranial abnormality. 2. ASPECTS is 10. 3. Age-related cerebral atrophy with chronic small vessel  ischemic disease, with multiple chronic  infarcts involving the bilateral frontal lobes and left cerebellum. Results were called by telephone at the time of interpretation on 12/21/2022 at 8:24 pm to provider Shaune Pollack , who verbally acknowledged these results. Electronically Signed   By: Rise Mu M.D.   On: 12/21/2022 20:27      IMPRESSION AND PLAN:  Assessment and Plan: * Syncope - The patient will be admitted to an observation medically monitored bed. - Will check orthostatics q 12 hours. - Will obtain a bilateral carotid Doppler and 2D echo. - The patient will be gently hydrated with IV normal saline and monitored for arrhythmias. -Differential diagnoses would include neurally mediated syncope, cardiogenic, arrhythmias related,  orthostatic hypotension, seizures and less likely hypoglycemia. - Will have to cover for potential residual sepsis given hypotension and elevated lactic acid with mild hypothermia.    Hypothyroidism - Continue Synthroid.  Hypokalemia Will continue potassium replacement.  Essential hypertension - We will continue her antihypertensive therapy with improvement of her blood pressure.  Coronary artery disease - We will continue Plavix.   DVT prophylaxis: Lovenox.  Advanced Care Planning:  Code Status: The patient is DNR and DNI. Family Communication:  The plan of care was discussed in details with the patient (and family). I answered all questions. The patient agreed to proceed with the above mentioned plan. Further management will depend upon hospital course. Disposition Plan: Back to previous home environment Consults called: none. All the records are reviewed and case discussed with ED provider.  Status is: Observation  I certify that at the time of admission, it is my clinical judgment that the patient will require  hospital care extending less than 2 midnights.                            Dispo: The patient is from: Home               Anticipated d/c is to: Home              Patient currently is not medically stable to d/c.              Difficult to place patient: No  Hannah Beat M.D on 12/21/2022 at 11:38 PM  [facility]   From 7 PM-7 AM, contact night-coverage [email or web address]  CC: Primary care physician; Patient, No Pcp Per

## 2022-12-21 NOTE — Discharge Summary (Signed)
Physician Discharge Summary   Patient: Kaylee [name] MRN: [id number] DOB: December 18, 1940  Admit date:     12/19/2022  Discharge date: 12/21/22  Discharge Physician: Enedina Finner   PCP: Patient, No Pcp Per   Recommendations at discharge:   patient to establish with PCP she used to follow-up before discussed this with son.  Discharge Diagnoses: Principal Problem:   Sepsis due to undetermined organism [facility]) Active Problems:   Metabolic acidosis   Failure to thrive in adult   Essential hypertension   Hypothyroidism   History of CVA (cerebrovascular accident)   Coronary artery disease   Fall at home, initial encounter   Weakness   Sepsis (HCC)  Kaylee [name] is a 82 y.o. female with medical history significant for hypertension, dyslipidemia, hypothyroidism, UTI and depression, who presented to the emergency room with acute onset of generalized weakness and fall.  Family stated that for the last couple of months she has been having diminished appetite and p.o. intake and has had weight loss.  She was diagnosed with breast cancer couple years ago and then chose not to be treated.  She was very weak today and fell and could not get up.  She required assistance.     Portable chest x-ray showed no acute cardiopulmonary disease.  Blood culture negative, COVID negative   Severe Sepsis due to undetermined organism (HCC)--source unknown at present - pt came in with leukocytosis, tachypnea with hypotension and elevated lactic acid level.   - She has associated generalized weakness that could be secondarily and failure to thrive with recent fall without injuries. -- UA positive for UTI. Urine culture not resulted since UA did not show significant white blood cells. -- Will de-escalate antibiotics to IV Rocephin -- change to PO Keflex -- Afebrile, lactic acid trending down. - Received hydration with IV lactated ringer. - Trended lactic acid level down -- sepsis improved   Failure to thrive in  adult --This is associated with generalized weakness and recent fall with diminished appetite lately and weight loss. - This could be certainly due to her breast cancer. - PT OT to see patient-- home PT arranged   Metabolic acidosis - This is likely secondary to lactic acidosis. - Resolved after IV hydration    History of CVA (cerebrovascular accident) - pt not on any meds--per son    Hypothyroidism - not on po meds --TSH wnl   Essential hypertension - BP a bit on the higher side --will start low dose Amlodipine   patient overall hemodynamically stable. Will switch her to oral antibiotic empirically finish five day course. Discussed discharge plan with patient's son Yenni Perlman on the phone. He will try to establish with her previous PCP. Patient will discharge to home with home health. Son in agreement   DVT prophylaxis: Lovenox.  Advanced Care Planning:  Code Status: discussed with patient and son Tinnie Gens patient requests DNR     Diet recommendation:  Discharge Diet Orders (From admission, onward)     Start     Ordered   12/21/22 0000  Diet - low sodium heart healthy        12/21/22 1230            DISCHARGE MEDICATION: Allergies as of 12/21/2022   No Known Allergies      Medication List     TAKE these medications    amLODipine 5 MG tablet Commonly known as: NORVASC Take 1 tablet (5 mg total) by mouth daily. Start taking on:  December 22, 2022   cephALEXin 500 MG capsule Commonly known as: KEFLEX Take 1 capsule (500 mg total) by mouth every 12 (twelve) hours for 2 days. Start taking on: December 22, 2022   feeding supplement Liqd Take 237 mLs by mouth 2 (two) times daily between meals.   multivitamin with minerals Tabs tablet Take 1 tablet by mouth daily.   Vitamin D3 50 MCG (2000 UT) capsule Take 1 capsule (2,000 Units total) by mouth daily.        Discharge Exam: Filed Weights   12/19/22 1838 12/20/22 1523  Weight: 63 kg 61.2 kg    GENERAL:  82 y.o.-year-old patient with no acute distress.  LUNGS: Normal breath sounds bilaterally, no wheezing CARDIOVASCULAR: S1, S2 normal. No murmur   ABDOMEN: Soft, nontender, nondistended. Bowel sounds present.  EXTREMITIES: No  edema b/l.    NEUROLOGIC: nonfocal  patient is alert and awake, pleasantly confused  Condition at discharge: fair  The results of significant diagnostics from this hospitalization (including imaging, microbiology, ancillary and laboratory) are listed below for reference.   Imaging Studies: CT Head Wo Contrast  Result Date: 12/19/2022 CLINICAL DATA:  Fall at home. Patient is acting confused which is not baseline. Wound on chest. EXAM: CT HEAD WITHOUT CONTRAST CT CERVICAL SPINE WITHOUT CONTRAST TECHNIQUE: Multidetector CT imaging of the head and cervical spine was performed following the standard protocol without intravenous contrast. Multiplanar CT image reconstructions of the cervical spine were also generated. RADIATION DOSE REDUCTION: This exam was performed according to the departmental dose-optimization program which includes automated exposure control, adjustment of the mA and/or kV according to patient size and/or use of iterative reconstruction technique. COMPARISON:  CTA head and neck 07/16/2020 FINDINGS: CT HEAD FINDINGS Brain: No intracranial hemorrhage, mass effect, or evidence of acute infarct. No hydrocephalus. No extra-axial fluid collection. Age related cerebral atrophy and chronic small vessel ischemic disease. Chronic bilateral frontal lobe infarcts. Remote lacunar infarct left cerebellum. Vascular: No hyperdense vessel. Intracranial arterial calcification. Skull: No fracture or focal lesion. Sinuses/Orbits: No acute finding. Other: None. CT CERVICAL SPINE FINDINGS Alignment: No evidence of traumatic malalignment. Loss of lordosis is likely chronic/positional. Skull base and vertebrae: No acute fracture. No primary bone lesion or focal pathologic  process. Soft tissues and spinal canal: No prevertebral fluid or swelling. No visible canal hematoma. Disc levels: Multilevel spondylosis, disc space height loss, and degenerative endplate changes greatest at C5-C6 and C6-C7 where it is advanced. Multilevel facet arthropathy most advanced on the right at C2-C3. Spinal canal narrowing is greatest at C6-C7 secondary to posterior disc osteophyte complex where it is moderate. Upper chest: No acute abnormality. Other: Carotid calcification. IMPRESSION: 1. No acute intracranial abnormality. Generalized atrophy, small vessel white matter disease, and chronic infarcts. 2. No acute fracture in the cervical spine. Multilevel degenerative spondylosis. Electronically Signed   By: Minerva Fester M.D.   On: 12/19/2022 20:41   CT Cervical Spine Wo Contrast  Result Date: 12/19/2022 CLINICAL DATA:  Fall at home. Patient is acting confused which is not baseline. Wound on chest. EXAM: CT HEAD WITHOUT CONTRAST CT CERVICAL SPINE WITHOUT CONTRAST TECHNIQUE: Multidetector CT imaging of the head and cervical spine was performed following the standard protocol without intravenous contrast. Multiplanar CT image reconstructions of the cervical spine were also generated. RADIATION DOSE REDUCTION: This exam was performed according to the departmental dose-optimization program which includes automated exposure control, adjustment of the mA and/or kV according to patient size and/or use of iterative reconstruction technique. COMPARISON:  CTA head and neck 07/16/2020 FINDINGS: CT HEAD FINDINGS Brain: No intracranial hemorrhage, mass effect, or evidence of acute infarct. No hydrocephalus. No extra-axial fluid collection. Age related cerebral atrophy and chronic small vessel ischemic disease. Chronic bilateral frontal lobe infarcts. Remote lacunar infarct left cerebellum. Vascular: No hyperdense vessel. Intracranial arterial calcification. Skull: No fracture or focal lesion. Sinuses/Orbits: No  acute finding. Other: None. CT CERVICAL SPINE FINDINGS Alignment: No evidence of traumatic malalignment. Loss of lordosis is likely chronic/positional. Skull base and vertebrae: No acute fracture. No primary bone lesion or focal pathologic process. Soft tissues and spinal canal: No prevertebral fluid or swelling. No visible canal hematoma. Disc levels: Multilevel spondylosis, disc space height loss, and degenerative endplate changes greatest at C5-C6 and C6-C7 where it is advanced. Multilevel facet arthropathy most advanced on the right at C2-C3. Spinal canal narrowing is greatest at C6-C7 secondary to posterior disc osteophyte complex where it is moderate. Upper chest: No acute abnormality. Other: Carotid calcification. IMPRESSION: 1. No acute intracranial abnormality. Generalized atrophy, small vessel white matter disease, and chronic infarcts. 2. No acute fracture in the cervical spine. Multilevel degenerative spondylosis. Electronically Signed   By: Minerva Fester M.D.   On: 12/19/2022 20:41   DG Chest Port 1 View  Result Date: 12/19/2022 CLINICAL DATA:  Questionable sepsis - evaluate for abnormality EXAM: PORTABLE CHEST 1 VIEW COMPARISON:  07/15/2020 FINDINGS: Left loop recorder device in place, unchanged. Heart and mediastinal contours are within normal limits. No focal opacities or effusions. No acute bony abnormality. IMPRESSION: No active cardiopulmonary disease. Electronically Signed   By: Charlett Nose M.D.   On: 12/19/2022 20:28    Microbiology: Results for orders placed or performed during the hospital encounter of 12/19/22  Blood Culture (routine x 2)     Status: None (Preliminary result)   Collection Time: 12/19/22  6:44 PM   Specimen: BLOOD  Result Value Ref Range Status   Specimen Description BLOOD BLOOD LEFT FOREARM  Final   Special Requests   Final    BOTTLES DRAWN AEROBIC AND ANAEROBIC Blood Culture results may not be optimal due to an inadequate volume of blood received in culture  bottles   Culture   Final    NO GROWTH 2 DAYS Performed at [facility], [address], [place], [place] [address]    Report Status PENDING  Incomplete  SARS Coronavirus 2 by RT PCR (hospital order, performed in [facility] hospital lab) *cepheid single result test* Anterior Nasal Swab     Status: None   Collection Time: 12/19/22  8:03 PM   Specimen: Anterior Nasal Swab  Result Value Ref Range Status   SARS Coronavirus 2 by RT PCR NEGATIVE NEGATIVE Final    Comment: (NOTE) SARS-CoV-2 target nucleic acids are NOT DETECTED.  The SARS-CoV-2 RNA is generally detectable in upper and lower respiratory specimens during the acute phase of infection. The lowest concentration of SARS-CoV-2 viral copies this assay can detect is 250 copies / mL. A negative result does not preclude SARS-CoV-2 infection and should not be used as the sole basis for treatment or other patient management decisions.  A negative result may occur with improper specimen collection / handling, submission of specimen other than nasopharyngeal swab, presence of viral mutation(s) within the areas targeted by this assay, and inadequate number of viral copies (<250 copies / mL). A negative result must be combined with clinical observations, patient history, and epidemiological information.  Fact Sheet for Patients:   RoadLapTop.co.za  Fact Sheet  for Healthcare Providers: http://kim-miller.com/  This test is not yet approved or  cleared by the [place] and has been authorized for detection and/or diagnosis of SARS-CoV-2 by FDA under an Emergency Use Authorization (EUA).  This EUA will remain in effect (meaning this test can be used) for the duration of the COVID-19 declaration under Section 564(b)(1) of the Act, 21 U.S.C. section 360bbb-3(b)(1), unless the authorization is terminated or revoked sooner.  Performed at [facility], [address] [place], [place] [address]   Blood Culture (routine x 2)     Status: None (Preliminary result)   Collection Time: 12/19/22  8:26 PM   Specimen: BLOOD  Result Value Ref Range Status   Specimen Description BLOOD BLOOD RIGHT HAND  Final   Special Requests   Final    BOTTLES DRAWN AEROBIC AND ANAEROBIC Blood Culture adequate volume   Culture   Final    NO GROWTH 2 DAYS Performed at [facility], [address] [place], [place] [address]    Report Status PENDING  Incomplete    Labs: CBC: Recent Labs  Lab 12/19/22 1833 12/20/22 0517  WBC 12.6* 15.5*  NEUTROABS 11.1*  --   HGB 10.8* 9.9*  HCT 36.7 32.9*  MCV 87.2 85.5  PLT 246 212   Basic Metabolic Panel: Recent Labs  Lab 12/19/22 1833 12/20/22 0517  NA 139 136  K 4.0 3.6  CL 101 102  CO2 16* 22  GLUCOSE 120* 98  BUN 17 17  CREATININE 1.10* 0.94  CALCIUM 10.0 9.8   Liver Function Tests: Recent Labs  Lab 12/19/22 1833  AST 38  ALT 12  ALKPHOS 85  BILITOT 1.3*  PROT 7.4  ALBUMIN 2.8*   CBG: Recent Labs  Lab 12/21/22 0809  GLUCAP 95    Discharge time spent: greater than 30 minutes.  Signed: Enedina Finner, MD [facility] 12/21/2022

## 2022-12-21 NOTE — Progress Notes (Signed)
Pharmacy Antibiotic Note  Kaylee [name] is a 82 y.o. female admitted on 12/21/2022 with infection of unknown source.  Pharmacy has been consulted for Cefepime & Vancomycin dosing for 7 days.  Plan: Cefepime 2 gm q12hr per indication & renal fxn.  Pt given Vancomycin 1250 mg once. Vancomycin 1250 mg IV Q 48 hrs. Goal AUC 400-550. Expected AUC: 519.4 SCr used: 1.2  Pharmacy will continue to follow and will adjust abx dosing whenever warranted.  Temp (24hrs), Avg:97.6 F (36.4 C), Min:97.2 F (36.2 C), Max:98.4 F (36.9 C)   Recent Labs  Lab 12/19/22 1833 12/19/22 2026 12/20/22 0517 12/20/22 1425 12/21/22 1924 12/21/22 1953 12/21/22 2200  WBC 12.6*  --  15.5*  --  11.2*  --   --   CREATININE 1.10*  --  0.94  --  1.20*  --   --   LATICACIDVEN 4.5* 4.3*  --  2.0*  --  4.5* 2.8*    Estimated Creatinine Clearance: 30 mL/min (A) (by C-G formula based on SCr of 1.2 mg/dL (H)).    No Known Allergies  Antimicrobials this admission: 9/7 Cefepime >> x 7 days 9/7 Flagyl >> x 7 days 9/7 Vancomycin >> x 7 days  Microbiology results: 9/7 BCx: Pending 9/7 UCx: Pending   Thank you for allowing pharmacy to be a part of this patient's care.  Otelia Sergeant, PharmD, [facility] 12/21/2022 11:15 PM

## 2022-12-21 NOTE — Progress Notes (Signed)
PHARMACY -  BRIEF ANTIBIOTIC NOTE   Pharmacy has received consult(s) for Cefepime and Vancomycin from an ED provider.  The patient's profile has been reviewed for ht/wt/allergies/indication/available labs.    One time order(s) placed for Cefepime 2g and Vancomycin 1250mg   Further antibiotics/pharmacy consults should be ordered by admitting physician if indicated.                       Thank you, Foye Deer 12/21/2022  9:23 PM

## 2022-12-21 NOTE — TOC Initial Note (Addendum)
Transition of Care [facility]) - Initial/Assessment Note    Patient Details  Name: Kaylee [name] MRN: [id number] Date of Birth: [birth year]  [facility] of [facility]) CM/SW Contact:    Liliana Cline, LCSW Phone Number: 12/21/2022, 10:48 AM  Clinical Narrative:                 Spoke with son Tinnie Gens) by phone regarding Home Health recommendations.  Patient is from home with her sister. Sister is working on setting up private pay aide services.  Tinnie Gens confirms patient has DME including a RW at home.  Tinnie Gens is agreeable to [facility] recommendation. Confirmed home address. No agency preferences. Referral accepted by Elnita Maxwell with [facility].  Updated RN and MD. Asked for [facility] orders.  PCP resources provided. Elnita Maxwell with [facility] states she can also assist with setting up a PCP.   Expected Discharge Plan: Home w Home Health Services Barriers to Discharge: Continued Medical Work up   Patient Goals and CMS Choice Patient states their goals for this hospitalization and ongoing recovery are:: home with home health CMS Medicare.gov Compare Post Acute Care list provided to:: Patient Represenative (must comment) Choice offered to / list presented to : Adult Children      Expected Discharge Plan and Services       Living arrangements for the past 2 months: Single Family Home                           HH Arranged: PT, OT HH Agency: [facility] Date [facility] Agency Contacted: 12/21/22   Representative spoke with at [facility] Agency: Elnita Maxwell  Prior Living Arrangements/Services Living arrangements for the past 2 months: Single Family Home Lives with:: Siblings Patient language and need for interpreter reviewed:: Yes Do you feel safe going back to the place where you live?: Yes      Need for Family Participation in Patient Care: Yes (Comment) Care giver support system in place?: Yes (comment) Current home services: DME Criminal Activity/Legal Involvement Pertinent to Current  Situation/Hospitalization: No - Comment as needed  Activities of Daily Living Home Assistive Devices/Equipment: Cane (specify quad or straight) ADL Screening (condition at time of admission) Patient's cognitive ability adequate to safely complete daily activities?: Yes Is the patient deaf or have difficulty hearing?: No Does the patient have difficulty seeing, even when wearing glasses/contacts?: No Does the patient have difficulty concentrating, remembering, or making decisions?: Yes Patient able to express need for assistance with ADLs?: Yes Does the patient have difficulty dressing or bathing?: Yes Independently performs ADLs?: No Communication: Independent Dressing (OT): Needs assistance Is this a change from baseline?: Change from baseline, expected to last >3 days Grooming: Independent Feeding: Independent Bathing: Needs assistance Is this a change from baseline?: Change from baseline, expected to last >3 days Toileting: Needs assistance Is this a change from baseline?: Change from baseline, expected to last >3days In/Out Bed: Needs assistance Is this a change from baseline?: Change from baseline, expected to last >3 days Walks in Home: Needs assistance Is this a change from baseline?: Change from baseline, expected to last >3 days Does the patient have difficulty walking or climbing stairs?: Yes Weakness of Legs: Both Weakness of Arms/Hands: None  Permission Sought/Granted Permission sought to share information with : Facility Industrial/product designer granted to share information with : Yes, Verbal Permission Granted (by son)     Permission granted to share info w AGENCY: Home Health agencies  Emotional Assessment         Alcohol / Substance Use: Not Applicable Psych Involvement: No (comment)  Admission diagnosis:  Lactic acidosis [E87.20] Weakness [R53.1] Sepsis (HCC) [A41.9] Sepsis due to undetermined organism [facility]) [A41.9] Patient Active  Problem List   Diagnosis Date Noted   Sepsis (HCC) 12/20/2022   Sepsis due to undetermined organism (HCC) 12/19/2022   Metabolic acidosis 12/19/2022   History of CVA (cerebrovascular accident) 12/19/2022   Coronary artery disease 12/19/2022   Failure to thrive in adult 12/19/2022   Fall at home, initial encounter 12/19/2022   Weakness 12/19/2022   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) 08/15/2020   Syncope 07/15/2020   NSTEMI (non-ST elevated myocardial infarction) (HCC) 07/15/2020   Essential hypertension 02/10/2018   Hypothyroidism 02/10/2018   Hyperlipidemia 02/10/2018   CVA (cerebral vascular accident) (HCC) 02/05/2018   PCP:  Patient, No Pcp Per Pharmacy:   [facility] [address] [place] - [address] [place] [address] Phone: [phone] Fax: [phone]     Social Determinants of Health (SDOH) Social History: SDOH Screenings   Food Insecurity: No Food Insecurity (12/20/2022)  Housing: Low Risk  (12/20/2022)  Transportation Needs: No Transportation Needs (12/20/2022)  Utilities: Not At Risk (12/20/2022)  Tobacco Use: Low Risk  (12/19/2022)   SDOH Interventions:     Readmission Risk Interventions    12/21/2022   10:47 AM  Readmission Risk Prevention Plan  Post Dischage Appt Complete  Medication Screening Complete  Transportation Screening Complete

## 2022-12-21 NOTE — ED Provider Notes (Signed)
[facility] Provider Note    Event Date/Time   First MD Initiated Contact with Patient 12/21/22 1933     (approximate)   History   Altered Mental Status   HPI  Kaylee [name] is a 82 y.o. female  here with AMS, decreased responsiveness. History provided by family.   Pt was just discharged from hospital today. Per family, she had been doing overall well, went home. When at home, she seemed somewhat normal but while sitting at the table this afternoon, suddenly slumped down and became less responsive. There was no shaking activity. She would not respond. EMS called, and BP was reportedly in 70-80s. She is now more awake but is speaking more slurred and confused than usual per family. They deny any seiizure like activity.   She is unable to provide any hx to me. Reviewed notes from admission, was admitted for sepsis of unclear source.      Physical Exam   Triage Vital Signs: ED Triage Vitals  Encounter Vitals Group     BP 12/21/22 1917 (!) 144/71     Systolic BP Percentile --      Diastolic BP Percentile --      Pulse Rate 12/21/22 1917 85     Resp 12/21/22 1917 (!) 22     Temp 12/21/22 1917 (!) 97.5 F (36.4 C)     Temp Source 12/21/22 1917 Axillary     SpO2 12/21/22 1917 96 %     Weight 12/21/22 1919 134 lb 14.7 oz (61.2 kg)     Height 12/21/22 1919 5\' 2"  (1.575 m)     Head Circumference --      Peak Flow --      Pain Score --      Pain Loc --      Pain Education --      Exclude from Growth Chart --     Most recent vital signs: Vitals:   12/22/22 0012 12/22/22 0014  BP: (!) 154/105 (!) 145/100  Pulse: 90 89  Resp: 20   Temp: 97.8 F (36.6 C)   SpO2: 96%      General: Awake, no distress. CV:  Good peripheral perfusion. RRR. No m/r/g. Resp:  Normal work of breathing. Lungs clear bilaterally. Abd:  No distention. No tenderness. Other:  Speech confused, but not slurred. Oriented to person but not place or time. Follows commands  intermittently. MAE with 5/5 strength. Normal sensation to light touch.   ED Results / Procedures / Treatments   Labs (all labs ordered are listed, but only abnormal results are displayed) Labs Reviewed  CBC WITH DIFFERENTIAL/PLATELET - Abnormal; Notable for the following components:      Result Value   WBC 11.2 (*)    RBC 3.62 (*)    Hemoglobin 9.5 (*)    HCT 30.9 (*)    RDW 16.0 (*)    Neutro Abs 9.2 (*)    Abs Immature Granulocytes 0.10 (*)    All other components within normal limits  COMPREHENSIVE METABOLIC PANEL - Abnormal; Notable for the following components:   Potassium 3.4 (*)    Glucose, Bld 162 (*)    Creatinine, Ser 1.20 (*)    Albumin 2.6 (*)    AST 51 (*)    GFR, Estimated 45 (*)    All other components within normal limits  LACTIC ACID, PLASMA - Abnormal; Notable for the following components:   Lactic Acid, Venous 4.5 (*)    All  other components within normal limits  LACTIC ACID, PLASMA - Abnormal; Notable for the following components:   Lactic Acid, Venous 2.8 (*)    All other components within normal limits  URINALYSIS, ROUTINE W REFLEX MICROSCOPIC - Abnormal; Notable for the following components:   Color, Urine YELLOW (*)    APPearance CLEAR (*)    Ketones, ur 5 (*)    All other components within normal limits  LACTIC ACID, PLASMA - Abnormal; Notable for the following components:   Lactic Acid, Venous 2.2 (*)    All other components within normal limits  PROTIME-INR - Abnormal; Notable for the following components:   Prothrombin Time 15.5 (*)    All other components within normal limits  SARS CORONAVIRUS 2 BY RT PCR  CULTURE, BLOOD (ROUTINE X 2)  CULTURE, BLOOD (ROUTINE X 2)  URINE CULTURE  PROCALCITONIN  APTT  BASIC METABOLIC PANEL  CBC  LACTIC ACID, PLASMA     EKG Normal sinus rhythm, VR 78. PR 136, QRS 76, QTc 425. No acute ST elevations or depressions. No ischemia.   RADIOLOGY CT Angio: No LVO CT Chest: No PNA  I also independently  reviewed and agree with radiologist interpretations.   PROCEDURES:  Critical Care performed: Yes, see critical care procedure note(s)  .Critical Care  Performed by: Shaune Pollack, MD Authorized by: Shaune Pollack, MD   Critical care provider statement:    Critical care time (minutes):  30   Critical care time was exclusive of:  Separately billable procedures and treating other patients   Critical care was necessary to treat or prevent imminent or life-threatening deterioration of the following conditions:  Cardiac failure, circulatory failure and respiratory failure   Critical care was time spent personally by me on the following activities:  Development of treatment plan with patient or surrogate, discussions with consultants, evaluation of patient's response to treatment, examination of patient, ordering and review of laboratory studies, ordering and review of radiographic studies, ordering and performing treatments and interventions, pulse oximetry, re-evaluation of patient's condition and review of old charts     MEDICATIONS ORDERED IN ED: Medications  sodium chloride flush (NS) 0.9 % injection 3 mL (has no administration in time range)  enoxaparin (LOVENOX) injection 40 mg (has no administration in time range)  0.9 %  sodium chloride infusion (has no administration in time range)  acetaminophen (TYLENOL) tablet 650 mg (has no administration in time range)    Or  acetaminophen (TYLENOL) suppository 650 mg (has no administration in time range)  traZODone (DESYREL) tablet 25 mg (has no administration in time range)  magnesium hydroxide (MILK OF MAGNESIA) suspension 30 mL (has no administration in time range)  ondansetron (ZOFRAN) tablet 4 mg (has no administration in time range)    Or  ondansetron (ZOFRAN) injection 4 mg (has no administration in time range)  lactated ringers infusion (has no administration in time range)  metroNIDAZOLE (FLAGYL) IVPB 500 mg (has no  administration in time range)  ceFEPIme (MAXIPIME) 2 g in sodium chloride 0.9 % 100 mL IVPB (has no administration in time range)  vancomycin (VANCOREADY) IVPB 1250 mg/250 mL (has no administration in time range)  potassium chloride (KLOR-CON) packet 40 mEq (has no administration in time range)  lactated ringers bolus 1,000 mL (0 mLs Intravenous Stopped 12/21/22 2159)  sodium chloride 0.9 % bolus 1,000 mL (0 mLs Intravenous Stopped 12/21/22 2159)  haloperidol lactate (HALDOL) injection 2.5 mg (2.5 mg Intravenous Given 12/21/22 2006)  iohexol (OMNIPAQUE) 350 MG/ML injection  75 mL (75 mLs Intravenous Contrast Given 12/21/22 2110)  ceFEPIme (MAXIPIME) 2 g in sodium chloride 0.9 % 100 mL IVPB (0 g Intravenous Stopped 12/21/22 2159)  vancomycin (VANCOREADY) IVPB 1250 mg/250 mL (1,250 mg Intravenous New Bag/Given 12/21/22 2158)     IMPRESSION / MDM / ASSESSMENT AND PLAN / ED COURSE  I reviewed the triage vital signs and the nursing notes.                              Differential diagnosis includes, but is not limited to, CVA, seizure, ACS, sepsis, arrhythmia.  Patient's presentation is most consistent with acute presentation with potential threat to life or bodily function.  The patient is on the cardiac monitor to evaluate for evidence of arrhythmia and/or significant heart rate changes  82 yo F with PMHx  HTN, HLD, CVA, recent admission for sepsis of unclear source, here with episode of unresponsiveness and hypotension shortly after returning home from discharge. Pt is confused more than baseline, with incoherent speech, and was transient hypotensive though this is responding to fluids. Given the fairly acute change in mental status and speech changes, stroke protocol activated. CT head is negative. Outside of window for tPA and given her h/o cancer, would be high risk candidate. Lab work shows LA elevation of 4.5, leukocytosis, mild increase in Cr from discharge, and persistently elevated procal.   Pt  covered for possible sepsis with broad-spectrum ABX and fluids, will admit to medicine.   FINAL CLINICAL IMPRESSION(S) / ED DIAGNOSES   Final diagnoses:  Lactic acidosis  Acute encephalopathy     Rx / DC Orders   ED Discharge Orders     None        Note:  This document was prepared using Dragon voice recognition software and may include unintentional dictation errors.   Shaune Pollack, MD 12/22/22 [phone]

## 2022-12-21 NOTE — Evaluation (Signed)
Occupational Therapy Evaluation Patient Details Name: Kaylee [name] MRN: [id number] DOB: May 27, 1940 Today's Date: 12/21/2022   History of Present Illness Pt is an 82 y.o. female presenting to hospital 12/19/22 with c/o weakness and fall.  Pt admitted with sepsis d/t undetermined organism, FTT, metabolic acidosis.  PMH includes htn, UTI, breast CA, stroke.   Clinical Impression     Prior to recent medical concerns, pt was modified independent ambulating with SPC; lives with her sister in 1 level home with steps to enter. Pt is not oriented to place, time, or situation this AM and is unable to provide information regarding her home situation, PLOF, and assistance received. Pr chart review, pt has assist for cooking, driving, and any medication needs.  No c/o pain during session.  Currently pt is SUPV-SBA for bed mobility, transfers, and ambulation. She demonstrates limited safety awareness and has difficulty following instructions. She is able to perform grooming, dressing in standing w/o any physical assistance. Pt will benefit from ongoing OT while hospitalized and continued OT services on a regular basis once returning home.     If plan is discharge home, recommend the following: A little help with bathing/dressing/bathroom;A little help with walking and/or transfers;Assistance with cooking/housework;Direct supervision/assist for financial management;Supervision due to cognitive status;Assist for transportation;Direct supervision/assist for medications management    Functional Status Assessment     Equipment Recommendations  None recommended by OT    Recommendations for Other Services       Precautions / Restrictions Precautions Precautions: Fall Restrictions Weight Bearing Restrictions: No      Mobility Bed Mobility Overal bed mobility: Needs Assistance Bed Mobility: Supine to Sit, Sit to Supine     Supine to sit: Supervision Sit to supine: Supervision   General bed mobility  comments: SUPV for safety    Transfers Overall transfer level: Needs assistance Equipment used: Rolling walker (2 wheels) Transfers: Sit to/from Stand, Bed to chair/wheelchair/BSC Sit to Stand: Supervision     Step pivot transfers: Supervision     General transfer comment: lack of safety awareness, unable to follow instructions for safe use of RW      Balance Overall balance assessment: Needs assistance Sitting-balance support: No upper extremity supported, Feet supported       Standing balance support: During functional activity, Single extremity supported Standing balance-Leahy Scale: Fair Standing balance comment: Pt repeatedly tries to ambulate with only 1 hand on RW, leaves walker behind, or gets it to one side. Requires ongoing verbal and tactile cueing                           ADL either performed or assessed with clinical judgement   ADL Overall ADL's : Needs assistance/impaired Eating/Feeding: Modified independent   Grooming: Wash/dry hands;Wash/dry face;Standing;Supervision/safety           Upper Body Dressing : Supervision/safety;Standing   Lower Body Dressing: Sitting/lateral leans;Modified independent Lower Body Dressing Details (indicate cue type and reason): donning/doffing socks                     Vision         Perception         Praxis         Pertinent Vitals/Pain Pain Assessment Pain Assessment: No/denies pain     Extremity/Trunk Assessment Upper Extremity Assessment Upper Extremity Assessment: Overall WFL for tasks assessed   Lower Extremity Assessment Lower Extremity Assessment: Generalized weakness   Cervical /  Trunk Assessment Cervical / Trunk Assessment: Normal   Communication Communication Communication: Difficulty following commands/understanding Following commands: Follows one step commands inconsistently   Cognition Arousal: Alert Behavior During Therapy: Restless Overall Cognitive Status:  History of cognitive impairments - at baseline                                 General Comments: Pt oriented to self only. Provides year as [birth year], not oriented to place or situation; unable to follow directions for safe use of RW, demonstrates lack of safety awareness     General Comments       Exercises     Shoulder Instructions      Home Living Family/patient expects to be discharged to:: Private residence Living Arrangements: Other relatives (sister, Kaylee [name]) Available Help at Discharge: Family;Available PRN/intermittently Type of Home: House Home Access: Stairs to enter [employer] of Steps: 3 steps with B railings (unable to reach both) plus 1 STE no railing   Home Layout: One level     Bathroom Shower/Tub: Chief Strategy Officer: Standard     Home Equipment: Educational psychologist (2 wheels);Cane - single point;Shower seat          Prior Functioning/Environment Prior Level of Function : Needs assist             Mobility Comments: Modified independent ambulating with SPC; no additional recent falls reported (other than most recent fall) ADLs Comments: Has assist for cooking, driving, and medications        OT Problem List: Decreased activity tolerance;Impaired balance (sitting and/or standing);Decreased safety awareness;Decreased cognition      OT Treatment/Interventions: Self-care/ADL training;Balance training;Patient/family education;Therapeutic exercise;Therapeutic activities;Cognitive remediation/compensation;DME and/or AE instruction    OT Goals(Current goals can be found in the care plan section) Acute Rehab OT Goals Patient Stated Goal: to go home OT Goal Formulation: With patient Time For Goal Achievement: 01/04/23 Potential to Achieve Goals: Good ADL Goals Pt Will Perform Grooming: with supervision;standing Pt Will Transfer to Toilet: with supervision;ambulating;regular height toilet (using  LRAD) Additional ADL Goal #1: Pt will consistently demonstrate correct and safe use of RW, using 2 hands on RW at all times and keeping RW in front of her as she ambulates  OT Frequency: Min 1X/week    Co-evaluation              AM-PAC OT "6 Clicks" Daily Activity     Outcome Measure Help from another person eating meals?: None Help from another person taking care of personal grooming?: A Little Help from another person toileting, which includes using toliet, bedpan, or urinal?: A Little Help from another person bathing (including washing, rinsing, drying)?: A Little Help from another person to put on and taking off regular upper body clothing?: None Help from another person to put on and taking off regular lower body clothing?: None 6 Click Score: 21   End of Session Equipment Utilized During Treatment: Rolling walker (2 wheels) Nurse Communication: Mobility status  Activity Tolerance: Patient tolerated treatment well Patient left: in chair;with call bell/phone within reach;with chair alarm set  OT Visit Diagnosis: Unsteadiness on feet (R26.81);Other symptoms and signs involving cognitive function;Muscle weakness (generalized) (M62.81)                Time: 0737-1062 OT Time Calculation (min): 19 min Charges:  OT General Charges $OT Visit: 1 Visit OT Evaluation $OT Eval Low Complexity: 1 Low OT  Treatments $Self Care/Home Management : 8-22 mins Latina Craver, PhD, MS, OTR/L 12/21/22, 11:16 AM

## 2022-12-21 NOTE — Assessment & Plan Note (Signed)
- 

## 2022-12-21 NOTE — Assessment & Plan Note (Addendum)
-   We will continue her antihypertensive therapy with improvement of her blood pressure.

## 2022-12-21 NOTE — Consult Note (Signed)
[facility] Consult Services   Patient Name:   Kaylee [name], Kaylee [name] Date of Birth:   08/19/1940 Identification Number:   MRN - [id number] Date of Service:   12/21/2022 20:11:54  Diagnosis:       R41.0 - Disorientation, unspecified  Impression:      82yo woman w/PMH of HTN, HLD, CAD, MI, CVA 2022, migraines, dementia, breast cancer p/w altered mental status on 12/21/22. EMS reports staff at her facility found her to be more altered then usual. Family states she lives with her sister who called EMS. EMS noted BP 76/51, gave her fluids and she gradually improved. Of note, she was discharged today from a recent admission for sepsis. She currently states "i'm tired". She is unable to provide history and is poorly cooperative with exam. She was last seen normal at 17:30. Her family states her speech has been abnormal since then. She uses a cane at baseline. He got haldol due to agitation during CT. Of note, she chose not to pursue treatment for her breast cancer. NIHSS 8. CT Head has no acute findings. 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 Presentation is concerning for infectious and/or toxic-metabolic encephalopathy, but unable to rule out an acute small vessel ischemic stroke at this time. Plan listed below was recommended to the ED physician by phone.  Our recommendations are outlined below.  Recommendations:        Stroke/Telemetry Floor       Neuro Checks       Bedside Swallow Eval       DVT Prophylaxis       IV Fluids, Normal Saline       Head of Bed 30 Degrees       Euglycemia and Avoid Hyperthermia (PRN Acetaminophen)       Initiate or continue Aspirin 81 MG daily       Antihypertensives PRN if Blood pressure is greater than 220/120 or there is a concern for End organ damage/contraindications for permissive HTN. If blood pressure is greater than 220/120 give labetalol PO or IV or Vasotec IV with a goal of 15% reduction in BP  during the first 24 hours.       MRI Brain without contrast to assess for stroke       Follow up CTA Head/Neck       TTE (if not recently done)       Lipid Profile, A1C       PT/OT, Speech/Swallow evaluation       Infectious/metabolic workup and management as per primary team  Sign Out:       Discussed with Emergency Department Provider    ------------------------------------------------------------------------------  Advanced Imaging: Advanced Imaging Deferred because:  Stroke not suspected with clinical presentation and exam   Metrics: Last Known Well: 12/21/2022 17:30:00 [facility] Notification Time: 12/21/2022 20:11:54 Arrival Time: 12/21/2022 19:10:00 Stamp Time: 12/21/2022 20:11:54 Initial Response Time: 12/21/2022 20:17:15 Symptoms: AMS. Initial patient interaction: 12/21/2022 20:23:58 NIHSS Assessment Completed: 12/21/2022 20:40:01 Patient is not a candidate for Thrombolytic. 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  CT head showed no acute hemorrhage or acute core infarct.  Primary Provider Notified of Diagnostic Impression and Management Plan on: 12/21/2022 20:47:34    ------------------------------------------------------------------------------  History of Present Illness: Patient is a 82 year old Female.  Patient was brought by EMS for symptoms of AMS. 82yo woman w/PMH of HTN, HLD, CAD,  MI, CVA 2022, migraines, dementia, breast cancer p/w altered mental status on 12/21/22. EMS reports staff at her facility found her to be more altered then usual. Family states she lives with her sister who called EMS. EMS noted BP 76/51, gave her fluids and she gradually improved. Of note, she was discharged today from a recent admission for sepsis. She currently states "i'm tired". She is unable to provide history and is poorly cooperative with exam. She  was last seen normal at 17:30. Her family states her speech has been abnormal since then. She uses a cane at baseline. He got haldol due to agitation during CT. Of note, she chose not to pursue treatment for her breast cancer.    Past Medical History: Other PMH:  see hpi unable to obtain due to:   Patient Is Confused  Medications:  No Anticoagulant use  No Antiplatelet use Reviewed EMR for current medications  Allergies:  Reviewed Allergies Unable To Obtain Due To: Patient Is Confused  Social History: Unable To Obtain Due To Patient Status : Patient Is Confused  Family History:  Family History Cannot Be Obtained Because:Patient Is Confused  ROS : ROS Cannot Be Obtained Because:  Patient Is Confused  Past Surgical History: Past Surgical History Cannot Be Obtained Because: Patient Is Confused    Examination: BP(144/71), Pulse(80), 1A: Level of Consciousness - Arouses to minor stimulation + 1 1B: Ask Month and Age - Could Not Answer Either Question Correctly + 2 1C: Blink Eyes & Squeeze Hands - Performs Both Tasks + 0 2: Test Horizontal Extraocular Movements - Normal + 0 3: Test Visual Fields - No Visual Loss + 0 4: Test Facial Palsy (Use Grimace if Obtunded) - Normal symmetry + 0 5A: Test Left Arm Motor Drift - No Drift for 10 Seconds + 0 5B: Test Right Arm Motor Drift - No Drift for 10 Seconds + 0 6A: Test Left Leg Motor Drift - Some Effort Against Gravity + 2 6B: Test Right Leg Motor Drift - Some Effort Against Gravity + 2 7: Test Limb Ataxia (FNF/Heel-Shin) - No Ataxia + 0 8: Test Sensation - Normal; No sensory loss + 0 9: Test Language/Aphasia - Normal; No aphasia + 0 10: Test Dysarthria - Mild-Moderate Dysarthria: Slurring but can be understood + 1 11: Test Extinction/Inattention - No abnormality + 0  NIHSS Score: 8  NIHSS Free Text : limited by haldol  Pre-Morbid Modified Rankin Scale: 2 Points = Slight disability; unable to carry out all previous  activities, but able to look after own affairs without assistance  Spoke with : ED MD  This consult was conducted in real time using interactive audio and Immunologist. Patient was informed of the technology being used for this visit and agreed to proceed. Patient located in hospital and provider located at home/office setting.   Patient is being evaluated for possible acute neurologic impairment and high probability of imminent or life-threatening deterioration. I spent total of 35 minutes providing care to this patient, including time for face to face visit via telemedicine, review of medical records, imaging studies and discussion of findings with providers, the patient and/or family.   Dr Gwenyth Bender   [facility] For Inpatient follow-up with [facility] physician please call [facility] [phone]. This is not an outpatient service. Post hospital discharge, please contact hospital directly.  Please do not communicate with [facility] physicians via secure chat. If you have any questions, Please contact [facility]. Please call or reconsult our service if there are any  clinical or diagnostic changes.

## 2022-12-21 NOTE — Assessment & Plan Note (Signed)
Will continue potassium replacement.

## 2022-12-21 NOTE — ED Notes (Signed)
Activated Code Stroke w/[facility] per Dr. Erma Heritage

## 2022-12-21 NOTE — Assessment & Plan Note (Addendum)
-   The patient will be admitted to an observation medically monitored bed. - Will check orthostatics q 12 hours. - Will obtain a bilateral carotid Doppler and 2D echo. - The patient will be gently hydrated with IV normal saline and monitored for arrhythmias. -Differential diagnoses would include neurally mediated syncope, cardiogenic, arrhythmias related,  orthostatic hypotension, seizures and less likely hypoglycemia. - Will have to cover for potential residual sepsis given hypotension and elevated lactic acid with mild hypothermia.

## 2022-12-21 NOTE — Assessment & Plan Note (Signed)
Continue Synthroid °

## 2022-12-21 NOTE — Plan of Care (Signed)
Pt is alert and oriented X 1. Discharge instruction given to pt son. No any questions and concern at this time. Problem: Fluid Volume: Goal: Hemodynamic stability will improve Outcome: Completed/Met   Problem: Clinical Measurements: Goal: Diagnostic test results will improve Outcome: Completed/Met Goal: Signs and symptoms of infection will decrease Outcome: Completed/Met   Problem: Respiratory: Goal: Ability to maintain adequate ventilation will improve Outcome: Completed/Met   Problem: Education: Goal: Knowledge of General Education information will improve Description: Including pain rating scale, medication(s)/side effects and non-pharmacologic comfort measures Outcome: Completed/Met   Problem: Health Behavior/Discharge Planning: Goal: Ability to manage health-related needs will improve Outcome: Completed/Met   Problem: Clinical Measurements: Goal: Ability to maintain clinical measurements within normal limits will improve Outcome: Completed/Met Goal: Will remain free from infection Outcome: Completed/Met Goal: Diagnostic test results will improve Outcome: Completed/Met Goal: Respiratory complications will improve Outcome: Completed/Met Goal: Cardiovascular complication will be avoided Outcome: Completed/Met   Problem: Activity: Goal: Risk for activity intolerance will decrease Outcome: Completed/Met   Problem: Nutrition: Goal: Adequate nutrition will be maintained Outcome: Completed/Met   Problem: Coping: Goal: Level of anxiety will decrease Outcome: Completed/Met   Problem: Elimination: Goal: Will not experience complications related to bowel motility Outcome: Completed/Met Goal: Will not experience complications related to urinary retention Outcome: Completed/Met   Problem: Pain Managment: Goal: General experience of comfort will improve Outcome: Completed/Met   Problem: Safety: Goal: Ability to remain free from injury will improve Outcome:  Completed/Met   Problem: Skin Integrity: Goal: Risk for impaired skin integrity will decrease Outcome: Completed/Met   Problem: Education: Goal: Knowledge of General Education information will improve Description: Including pain rating scale, medication(s)/side effects and non-pharmacologic comfort measures Outcome: Completed/Met

## 2022-12-21 NOTE — Consult Note (Addendum)
Code Stroke 2008: Code stroke cart activated. Pt in CT at time of activation. Pt with AMS, drooling, decreased LOC symptoms.  Requested that staff pre-pull TNK at this time: NO- hx of cancer 2008: Patient left for CT at this time.  2011: TSP paged at this time.  2019: Patient returned from CT at this time.  2023: Dr. Caprice Renshaw on camera at this time. Patient report and history provided to Dr. Caprice Renshaw at this time. NCCT imaging results provided on camera.  2024: Dr. Caprice Renshaw performing neuro assessment at this time.  No TNK, MRS 2, NIH 8

## 2022-12-22 ENCOUNTER — Observation Stay: Payer: Medicare Other

## 2022-12-22 DIAGNOSIS — Z6836 Body mass index (BMI) 36.0-36.9, adult: Secondary | ICD-10-CM | POA: Diagnosis not present

## 2022-12-22 DIAGNOSIS — E785 Hyperlipidemia, unspecified: Secondary | ICD-10-CM | POA: Diagnosis present

## 2022-12-22 DIAGNOSIS — I63522 Cerebral infarction due to unspecified occlusion or stenosis of left anterior cerebral artery: Secondary | ICD-10-CM

## 2022-12-22 DIAGNOSIS — F32A Depression, unspecified: Secondary | ICD-10-CM | POA: Diagnosis present

## 2022-12-22 DIAGNOSIS — R29708 NIHSS score 8: Secondary | ICD-10-CM | POA: Diagnosis present

## 2022-12-22 DIAGNOSIS — R4702 Dysphasia: Secondary | ICD-10-CM | POA: Diagnosis present

## 2022-12-22 DIAGNOSIS — G9340 Encephalopathy, unspecified: Secondary | ICD-10-CM | POA: Diagnosis not present

## 2022-12-22 DIAGNOSIS — Z1152 Encounter for screening for COVID-19: Secondary | ICD-10-CM | POA: Diagnosis not present

## 2022-12-22 DIAGNOSIS — I639 Cerebral infarction, unspecified: Secondary | ICD-10-CM

## 2022-12-22 DIAGNOSIS — I6330 Cerebral infarction due to thrombosis of unspecified cerebral artery: Secondary | ICD-10-CM | POA: Diagnosis present

## 2022-12-22 DIAGNOSIS — I700 Atherosclerosis of aorta: Secondary | ICD-10-CM | POA: Diagnosis present

## 2022-12-22 DIAGNOSIS — I10 Essential (primary) hypertension: Secondary | ICD-10-CM | POA: Diagnosis present

## 2022-12-22 DIAGNOSIS — I2510 Atherosclerotic heart disease of native coronary artery without angina pectoris: Secondary | ICD-10-CM | POA: Diagnosis present

## 2022-12-22 DIAGNOSIS — R627 Adult failure to thrive: Secondary | ICD-10-CM | POA: Diagnosis present

## 2022-12-22 DIAGNOSIS — D630 Anemia in neoplastic disease: Secondary | ICD-10-CM | POA: Diagnosis present

## 2022-12-22 DIAGNOSIS — Z8249 Family history of ischemic heart disease and other diseases of the circulatory system: Secondary | ICD-10-CM | POA: Diagnosis not present

## 2022-12-22 DIAGNOSIS — Z8673 Personal history of transient ischemic attack (TIA), and cerebral infarction without residual deficits: Secondary | ICD-10-CM | POA: Diagnosis not present

## 2022-12-22 DIAGNOSIS — Z8042 Family history of malignant neoplasm of prostate: Secondary | ICD-10-CM | POA: Diagnosis not present

## 2022-12-22 DIAGNOSIS — F0390 Unspecified dementia without behavioral disturbance: Secondary | ICD-10-CM | POA: Diagnosis present

## 2022-12-22 DIAGNOSIS — I611 Nontraumatic intracerebral hemorrhage in hemisphere, cortical: Secondary | ICD-10-CM | POA: Diagnosis not present

## 2022-12-22 DIAGNOSIS — E039 Hypothyroidism, unspecified: Secondary | ICD-10-CM | POA: Diagnosis present

## 2022-12-22 DIAGNOSIS — N390 Urinary tract infection, site not specified: Secondary | ICD-10-CM | POA: Diagnosis present

## 2022-12-22 DIAGNOSIS — R4182 Altered mental status, unspecified: Secondary | ICD-10-CM | POA: Diagnosis not present

## 2022-12-22 DIAGNOSIS — Z803 Family history of malignant neoplasm of breast: Secondary | ICD-10-CM | POA: Diagnosis not present

## 2022-12-22 DIAGNOSIS — E876 Hypokalemia: Secondary | ICD-10-CM | POA: Diagnosis present

## 2022-12-22 DIAGNOSIS — I959 Hypotension, unspecified: Secondary | ICD-10-CM | POA: Diagnosis present

## 2022-12-22 DIAGNOSIS — E872 Acidosis, unspecified: Secondary | ICD-10-CM | POA: Diagnosis present

## 2022-12-22 DIAGNOSIS — Z66 Do not resuscitate: Secondary | ICD-10-CM | POA: Diagnosis present

## 2022-12-22 DIAGNOSIS — C787 Secondary malignant neoplasm of liver and intrahepatic bile duct: Secondary | ICD-10-CM | POA: Diagnosis present

## 2022-12-22 LAB — BASIC METABOLIC PANEL
Anion gap: 8 (ref 5–15)
BUN: 15 mg/dL (ref 8–23)
CO2: 25 mmol/L (ref 22–32)
Calcium: 9.5 mg/dL (ref 8.9–10.3)
Chloride: 105 mmol/L (ref 98–111)
Creatinine, Ser: 0.94 mg/dL (ref 0.44–1.00)
GFR, Estimated: 60 mL/min — ABNORMAL LOW (ref >60)
Glucose, Bld: 143 mg/dL — ABNORMAL HIGH (ref 70–99)
Potassium: 3.6 mmol/L (ref 3.5–5.1)
Sodium: 138 mmol/L (ref 135–145)

## 2022-12-22 LAB — LACTIC ACID, PLASMA
Lactic Acid, Venous: 2 mmol/L (ref 0.5–1.9)
Lactic Acid, Venous: 2.2 mmol/L (ref 0.5–1.9)

## 2022-12-22 LAB — CBC
HCT: 30 % — ABNORMAL LOW (ref 36.0–46.0)
Hemoglobin: 9.2 g/dL — ABNORMAL LOW (ref 12.0–15.0)
MCH: 25.9 pg — ABNORMAL LOW (ref 26.0–34.0)
MCHC: 30.7 g/dL (ref 30.0–36.0)
MCV: 84.5 fL (ref 80.0–100.0)
Platelets: 201 10*3/uL (ref 150–400)
RBC: 3.55 MIL/uL — ABNORMAL LOW (ref 3.87–5.11)
RDW: 16.1 % — ABNORMAL HIGH (ref 11.5–15.5)
WBC: 10.7 10*3/uL — ABNORMAL HIGH (ref 4.0–10.5)
nRBC: 0 % (ref 0.0–0.2)

## 2022-12-22 LAB — LIPID PANEL
Cholesterol: 135 mg/dL (ref 0–200)
HDL: 47 mg/dL (ref >40)
LDL Cholesterol: 80 mg/dL (ref 0–99)
Total CHOL/HDL Ratio: 2.9 ratio
Triglycerides: 42 mg/dL (ref <150)
VLDL: 8 mg/dL (ref 0–40)

## 2022-12-22 LAB — GLUCOSE, CAPILLARY: Glucose-Capillary: 146 mg/dL — ABNORMAL HIGH (ref 70–99)

## 2022-12-22 MED ORDER — GADOBUTROL 1 MMOL/ML IV SOLN
6.0000 mL | Freq: Once | INTRAVENOUS | Status: AC | PRN
  Administered 2022-12-22: 6 mL via INTRAVENOUS

## 2022-12-22 MED ORDER — ATORVASTATIN CALCIUM 20 MG PO TABS
20.0000 mg | ORAL_TABLET | Freq: Every day | ORAL | Status: DC
  Administered 2022-12-22 – 2022-12-23 (×2): 20 mg via ORAL
  Filled 2022-12-22 (×2): qty 1

## 2022-12-22 MED ORDER — SODIUM CHLORIDE 0.9 % IV SOLN
1.0000 g | INTRAVENOUS | Status: AC
  Administered 2022-12-22 – 2022-12-23 (×2): 1 g via INTRAVENOUS
  Filled 2022-12-22 (×2): qty 10

## 2022-12-22 NOTE — Progress Notes (Signed)
OT Cancellation Note  Patient Details Name: Kaylee [name] MRN: [id number] DOB: 03/06/1941   Cancelled Treatment:    Reason Eval/Treat Not Completed: Fatigue/lethargy limiting ability to participate. OT order received, chart reviewed. Upon arrival to pt room, pt sleeping soundly in bed. Wakes minimally with verbal/tactile stim. Unable to remain awake for OT evaluation at this time. Will hold and re-attempt at a later date/time as available and pt medically appropriate for OT services.  Rockney Ghee, M.S., OTR/L 12/22/22, 10:05 AM

## 2022-12-22 NOTE — Progress Notes (Signed)
SLP Cancellation Note  Patient Details Name: IDALINA GOLASZEWSKI MRN: [id number] DOB: [birth year]   Cancelled treatment:       Reason Eval/Treat Not Completed: Patient at procedure or test/unavailable (Pt OTF for MRI. Will continue efforts as appropriate.)  Clyde Canterbury, M.S., CCC-SLP Speech-Language Pathologist [facility] [phone] Arnette Felts)  Woodroe Chen 12/22/2022, 11:26 AM

## 2022-12-22 NOTE — Progress Notes (Signed)
PT Cancellation Note  Patient Details Name: Kaylee [name] MRN: [id number] DOB: March 23, 1941   Cancelled Treatment:    Reason Eval/Treat Not Completed: Other (comment) PT orders received, chart reviewed. Per OT, pt lethargic this AM with family reporting pt sleeps till 1-2pm anyway so likely best to attempt in PM. Pt noted to have MRI completed but no results yet & MD recommending to hold until MD rounds on her. Will f/u as able.  Aleda Grana, PT, DPT 12/22/22, 1:11 PM   Sandi Mariscal 12/22/2022, 1:10 PM

## 2022-12-22 NOTE — Plan of Care (Signed)
  Problem: Cardiac: Goal: Will achieve and/or maintain adequate cardiac output Outcome: Progressing   Problem: Physical Regulation: Goal: Complications related to the disease process, condition or treatment will be avoided or minimized Outcome: Progressing   Problem: Nutrition: Goal: Adequate nutrition will be maintained Outcome: Progressing   Problem: Nutrition: Goal: Adequate nutrition will be maintained Outcome: Progressing   Problem: Coping: Goal: Level of anxiety will decrease Outcome: Progressing   Problem: Elimination: Goal: Will not experience complications related to bowel motility Outcome: Progressing

## 2022-12-22 NOTE — Evaluation (Signed)
Occupational Therapy Evaluation Patient Details Name: Kaylee [name] MRN: [id number] DOB: 02-07-1941 Today's Date: 12/22/2022   History of Present Illness H&P: Pt is an 82 y/o F admitted on 12/21/22 after presenting with c/o acute onset of AMS & unresponsiveness after being d/c earlier this afternoon. Per MD, MRI "shows acute left frontal infarct". Of note, pt with recent admission on 9/5 for management of severe sepsis, FTT, & metabolic acidosis. PMH: HTN, dyslipidemia, hypothyroidism, UTI, depression, migraine, CVA 2022, dementia, breast CA, MI   Clinical Impression   Pt was seen for OT evaluation this date. Prior to hospital admission, pt required some assistance from family members to bathe, but otherwise was MOD I for ADL management using a QC. Pt lives with her sister in a 1 level home with 5 STE. Pt presents to acute OT demonstrating impaired ADL performance and functional mobility 2/2 generalized weakness, decreased cognition, decreased safety awareness, and limited balance (See OT problem list for additional functional deficits). Pt currently requires CGA for safety with bed/functional mobility, MOD A for STS t/fs intermittently, and close supervision with seated UB ADL management.  Pt would benefit from skilled OT services to address noted impairments and functional limitations (see below for any additional details) in order to maximize safety and independence while minimizing falls risk and caregiver burden. Anticipate the need for follow up OT services upon acute hospital DC.        If plan is discharge home, recommend the following: Assistance with cooking/housework;Direct supervision/assist for financial management;Supervision due to cognitive status;Assist for transportation;Direct supervision/assist for medications management;A lot of help with walking and/or transfers;A lot of help with bathing/dressing/bathroom    Functional Status Assessment  Patient has had a recent decline in  their functional status and demonstrates the ability to make significant improvements in function in a reasonable and predictable amount of time.  Equipment Recommendations  None recommended by OT    Recommendations for Other Services       Precautions / Restrictions Precautions Precautions: Fall Restrictions Weight Bearing Restrictions: No      Mobility Bed Mobility Overal bed mobility: Needs Assistance Bed Mobility: Supine to Sit     Supine to sit: HOB elevated, Used rails, Supervision          Transfers Overall transfer level: Needs assistance Equipment used: Rolling walker (2 wheels) Transfers: Sit to/from Stand Sit to Stand: Supervision, Mod assist, +2 physical assistance     Step pivot transfers: Contact guard assist     General transfer comment: STS from EOB with RW & CGA, STS from recliner with mod assist +2      Balance Overall balance assessment: Needs assistance Sitting-balance support: No upper extremity supported, Feet supported Sitting balance-Leahy Scale: Good Sitting balance - Comments: steady reaching within BOS   Standing balance support: During functional activity, Single extremity supported, Bilateral upper extremity supported Standing balance-Leahy Scale: Poor Standing balance comment: Pt repeatedly tries to ambulate with only 1 hand on RW, leaves walker behind, or gets it to one side. Requires ongoing verbal and tactile cueing                           ADL either performed or assessed with clinical judgement   ADL Overall ADL's : Needs assistance/impaired  General ADL Comments: Pt functionally limited by cognition, generalized weakness, and decreased activity tolerance. Suspect limited motor planning impacted functional indepndence as well. She is able to perform bed mobility and step pivot transfer to recliner with initial CGA for safety using a RW. However, when attempting  to stand from recliner she requires incrased +2 assistance to come to full stand. Pt is able to perform peri-care while standing at EOB given CGA.     Vision Ability to See in Adequate Light: 1 Impaired Vision Assessment?: Yes Ocular Range of Motion: Restricted on the left;Restricted looking up;Impaired-to be further tested in functional context Alignment/Gaze Preference: Within Defined Limits Tracking/Visual Pursuits: Unable to hold eye position out of midline;Requires cues, head turns, or add eye shifts to track Saccades: Additional eye shifts occurred during testing;Additional head turns occurred during testing;Impaired - to be further tested in functional context Additional Comments: Pt has difficulty following instructions for visual assessment.     Perception Perception: Within Functional Limits       Praxis Praxis: Impaired Praxis Impairment Details: Motor planning     Pertinent Vitals/Pain Pain Assessment Pain Assessment: No/denies pain     Extremity/Trunk Assessment Upper Extremity Assessment Upper Extremity Assessment: Left hand dominant (decreased [facility] appreciated with finger to nose in LUE, BUE generally weak, pt denies sensory deficit at this time.)   Lower Extremity Assessment Lower Extremity Assessment: Generalized weakness       Communication Communication Following commands: Follows one step commands with increased time;Follows one step commands inconsistently Cueing Techniques: Verbal cues;Gestural cues;Tactile cues;Visual cues   Cognition Arousal: Alert Behavior During Therapy: WFL for tasks assessed/performed Overall Cognitive Status: No family/caregiver present to determine baseline cognitive functioning (family stepped out of room prior to cognition assessment)                                 General Comments: Pt oriented to self & location, lacks sustained attention, impaired safety awareness and decreased motor planning appreciated.      General Comments  Skin impairment noted at upper mid chest (pt unable to state what it was)    Exercises Other Exercises Other Exercises: Pt educated on role of OT in acute setting, safe use of AE/DME for ADL management, safe transfer techniques, and falls prevention strategies this date.   Shoulder Instructions      Home Living Family/patient expects to be discharged to:: Private residence Living Arrangements: Other relatives (Sister) Available Help at Discharge: Family Type of Home: House Home Access: Stairs to enter [employer] of Steps: 5 steps at sunroom entrance with B rails Entrance Stairs-Rails: Right;Left Home Layout: One level     Bathroom Shower/Tub: Chief Strategy Officer: Standard (BSC)     Home Equipment: Agricultural consultant (2 wheels);Cane - single point;Shower seat;BSC/3in1;Tub bench          Prior Functioning/Environment Prior Level of Function : Needs assist             Mobility Comments: Pt ambulated with cane prior to admission, ADLs Comments: Sisters assisted with bathing        OT Problem List: Decreased activity tolerance;Impaired balance (sitting and/or standing);Decreased safety awareness;Decreased cognition;Decreased strength;Decreased coordination;Decreased range of motion;Decreased knowledge of use of DME or AE;Impaired UE functional use      OT Treatment/Interventions: Self-care/ADL training;Balance training;Patient/family education;Therapeutic exercise;Therapeutic activities;Cognitive remediation/compensation;DME and/or AE instruction;Neuromuscular education    OT Goals(Current goals can be found in the  care plan section) Acute Rehab OT Goals Patient Stated Goal: To go home OT Goal Formulation: With patient Time For Goal Achievement: 01/05/23 Potential to Achieve Goals: Good ADL Goals Pt Will Perform Grooming: sitting;with set-up;with supervision;standing Pt Will Perform Lower Body Dressing: sit to/from stand;with  supervision;with set-up Pt Will Transfer to Toilet: bedside commode;with set-up;with supervision;ambulating Pt Will Perform Toileting - Clothing Manipulation and hygiene: with adaptive equipment;with supervision;with set-up;sit to/from stand  OT Frequency: Min 1X/week    Co-evaluation   Reason for Co-Treatment: Complexity of the patient's impairments (multi-system involvement);Necessary to address cognition/behavior during functional activity;For patient/therapist safety PT goals addressed during session: Mobility/safety with mobility;Balance        AM-PAC OT "6 Clicks" Daily Activity     Outcome Measure Help from another person eating meals?: A Little Help from another person taking care of personal grooming?: A Little Help from another person toileting, which includes using toliet, bedpan, or urinal?: A Little Help from another person bathing (including washing, rinsing, drying)?: A Lot Help from another person to put on and taking off regular upper body clothing?: A Little Help from another person to put on and taking off regular lower body clothing?: A Lot 6 Click Score: 16   End of Session Equipment Utilized During Treatment: Rolling walker (2 wheels)  Activity Tolerance: Patient tolerated treatment well Patient left: in chair;with call bell/phone within reach;with chair alarm set  OT Visit Diagnosis: Muscle weakness (generalized) (M62.81);Other abnormalities of gait and mobility (R26.89);Other symptoms and signs involving the nervous system (R29.898);Other symptoms and signs involving cognitive function                Time: 1435-1459 OT Time Calculation (min): 24 min Charges:  OT General Charges $OT Visit: 1 Visit OT Evaluation $OT Eval Moderate Complexity: 1 Mod  Rockney Ghee, M.S., OTR/L 12/22/22, 3:40 PM

## 2022-12-22 NOTE — Consult Note (Addendum)
NEURO HOSPITALIST CONSULT NOTE   Requestig physician: Dr. Arville Care  Reason for Consult: Episode of unresponsiveness in the context of hypotensiion  History obtained from:  Son and Chart     HPI:                                                                                                                                          Kaylee [name] is an 82 y.o. female with a PMHx of HTN, HLD, hypothyroidism, depression, migraine headaches, stroke, loop recorder insertion and UTI who re-presented to the ED yesterday night after acute onset of AMS with unresponsiveness a few hours following discharge from the hospital earlier the same day after an admission for sepsis in the setting of FTT with weight loss and diffuse generalized weakness. She was sitting at the table on Saturday afternoon with family after her discharge, when suddenly she slumped over and became unresponsive. No seizure-like activity was noted. EMS was called and SBP was in the 70s-80s on their arrival. 500 NS bolus was given, followed by some improvement in her BP to 100/70. CBG was 169, HR was 74, RR 21 and she was satting 90-96% on RA. In the ED she woke up but family stated that she was not at her baseline, with her speech being more slurred and confused than usual. Lactate was 4.5 and WBC revealed leukocytosis. Code Stroke was then activated.   CT head showed no acute findings. Teleneurology evaluation in the context of Haldol received prior to CT revealed an NIHSS of 8. She was not a candidate to TNK due to possible active GI cancer. DDx per Teleneurologist included infectious and/or toxic-metabolic encephalopathy, versus acute small vessel ischemic stroke. MRI brain, CTA of head/neck and permissive HTN were recommended. CTA of head/neck was negative for LVO; mild for age atheromatous change about the carotid bifurcations and carotid siphons, without hemodynamically significant or correctable stenosis, was  noted.  Of note, CT of chest revealed multiple suspicious lesions of the right hepatic lobe, which were new when compared with prior CT of the abdomen and pelvis from April 2022, felt likely metastatic disease given history of primary breast malignancy. A calcified soft tissue mass of the medial right breast, possibly primary breast malignancy, was also noted. Nonspecific small solid nodules of the right upper and lower lobes measuring 5 mm were present as well.    The patient was given IV ABX and IV fluid boluses.    Past Medical History:  Diagnosis Date   Depression    Fainting spell    Frequent headaches    Hyperlipidemia    Hypertension    Kidney lesion, native, right    Migraine    Stroke [facility])    Thyroid disease    UTI (urinary tract  infection)     Past Surgical History:  Procedure Laterality Date   LOOP RECORDER INSERTION N/A 02/24/2018   Procedure: LOOP RECORDER INSERTION;  Surgeon: Duke Salvia, MD;  Location: [facility] INVASIVE CV LAB;  Service: Cardiovascular;  Laterality: N/A;   TEE WITHOUT CARDIOVERSION N/A 02/24/2018   Procedure: TRANSESOPHAGEAL ECHOCARDIOGRAM (TEE);  Surgeon: Antonieta Iba, MD;  Location: [facility] ORS;  Service: Cardiovascular;  Laterality: N/A;    Family History  Problem Relation Age of Onset   Dementia Mother    Hypertension Mother    Hypothyroidism Mother    Prostate cancer Father    Breast cancer Sister 26   Hodgkin's lymphoma Sister              Social History:  reports that she has never smoked. She has never used smokeless tobacco. She reports that she does not currently use alcohol. She reports that she does not currently use drugs.  No Known Allergies  MEDICATIONS:                                                                                                                     Prior to Admission:  Medications Prior to Admission  Medication Sig Dispense Refill Last Dose   amLODipine (NORVASC) 5 MG tablet Take 1 tablet (5 mg total)  by mouth daily. 30 tablet 0 12/21/2022 at am   cephALEXin (KEFLEX) 500 MG capsule Take 1 capsule (500 mg total) by mouth every 12 (twelve) hours for 2 days. 4 capsule 0 12/21/2022 at am   Cholecalciferol (VITAMIN D3) 50 MCG (2000 UT) capsule Take 1 capsule (2,000 Units total) by mouth daily. 90 capsule 1 Unknown at Unknown   Multiple Vitamin (MULTIVITAMIN WITH MINERALS) TABS tablet Take 1 tablet by mouth daily.   Unknown at Unknown   feeding supplement (ENSURE ENLIVE / ENSURE PLUS) LIQD Take 237 mLs by mouth 2 (two) times daily between meals. 237 mL 12    Scheduled:  enoxaparin (LOVENOX) injection  40 mg Subcutaneous Q24H   sodium chloride flush  3 mL Intravenous Q12H   Continuous:  sodium chloride 100 mL/hr at 12/22/22 0235   cefTRIAXone (ROCEPHIN)  IV       ROS:                                                                                                                                       Unable  to obtain due to abulia.    Blood pressure (!) 162/91, pulse 85, temperature 97.6 F (36.4 C), temperature source Oral, resp. rate 16, height 5\' 2"  (1.575 m), weight 63.9 kg, SpO2 99%.   General Examination:                                                                                                       Physical Exam  Physical Exam HEENT- [place]/AT. No nuchal rigidity Lungs- Respirations unlabored Extremities- Warm and well-perfused  Neurological Examination Mental Status: Sleeping when examiner enters the room. Will awaken to a drowsy state. Decreased interactivity, but will follow some commands and answer some simple questions. Speech is sparse. Unable to assess fluency as most of her answers are single, hypophonically-uttered words that are mildly dysarthric when starting to fall asleep, nondysarthric when briefly awake. On testing of orientation, she is poorly oriented and often perseverates her name or the previous answer given. Naming for "ear", "nose" and "chin" are intact, but she  cannot correctly identify examiner's thumb. Comprehension intact for basic questions and commands. No agitation noted. . Cranial Nerves: II: Temporal visual fields intact with no extinction to DSS. PERRL. III,IV, VI: No ptosis. EOMI. No nystagmus. V: FT sensation equal bilaterally VII: Smile symmetric VIII: Hearing intact to voice IX,X: Hypophonic speech XI: Symmetric XII: Midline tongue extension Motor: RUE: 4+/5 LUE: 4+/5 RLE: 4+/5 LLE: 4+/5 Sensory: FT intact x 4.  Deep Tendon Reflexes: 1-2+ bilateral biceps, brachioradialis and patellae Cerebellar: No ataxia with FNF bilaterally Gait: Deferred   Lab Results: Basic Metabolic Panel: Recent Labs  Lab 12/19/22 1833 12/20/22 0517 12/21/22 1924 12/22/22 0356  NA 139 136 135 138  K 4.0 3.6 3.4* 3.6  CL 101 102 98 105  CO2 16* 22 23 25   GLUCOSE 120* 98 162* 143*  BUN 17 17 18 15   CREATININE 1.10* 0.94 1.20* 0.94  CALCIUM 10.0 9.8 10.1 9.5    CBC: Recent Labs  Lab 12/19/22 1833 12/20/22 0517 12/21/22 1924 12/22/22 0356  WBC 12.6* 15.5* 11.2* 10.7*  NEUTROABS 11.1*  --  9.2*  --   HGB 10.8* 9.9* 9.5* 9.2*  HCT 36.7 32.9* 30.9* 30.0*  MCV 87.2 85.5 85.4 84.5  PLT 246 212 240 201    Cardiac Enzymes: No results for input(s): "CKTOTAL", "CKMB", "CKMBINDEX", "TROPONINI" in the last 168 hours.  Lipid Panel: No results for input(s): "CHOL", "TRIG", "HDL", "CHOLHDL", "VLDL", "LDLCALC" in the last 168 hours.  Imaging: CT ANGIO HEAD NECK W WO CM  Result Date: 12/21/2022 CLINICAL DATA:  Initial evaluation for neuro deficit, stroke suspected. EXAM: CT ANGIOGRAPHY HEAD AND NECK WITH AND WITHOUT CONTRAST TECHNIQUE: Multidetector CT imaging of the head and neck was performed using the standard protocol during bolus administration of intravenous contrast. Multiplanar CT image reconstructions and MIPs were obtained to evaluate the vascular anatomy. Carotid stenosis measurements (when applicable) are obtained utilizing NASCET  criteria, using the distal internal carotid diameter as the denominator. RADIATION DOSE REDUCTION: This exam was performed according to the departmental dose-optimization program which includes automated exposure control, adjustment  of the mA and/or kV according to patient size and/or use of iterative reconstruction technique. CONTRAST:  75mL OMNIPAQUE IOHEXOL 350 MG/ML SOLN COMPARISON:  CT from earlier the same day as well as prior CTA from 08/12/2020. FINDINGS: CTA NECK FINDINGS Aortic arch: Visualized aortic arch within normal limits for caliber with standard branch pattern. No stenosis about the origin of the great vessels. Right carotid system: Right common and internal carotid arteries are tortuous without evidence for dissection. Mild for age atheromatous change about the right carotid bulb without hemodynamically significant stenosis. Left carotid system: Left common and internal carotid arteries are tortuous but patent without dissection. Mild for age atheromatous change about the left carotid bulb without hemodynamically significant stenosis. Vertebral arteries: Both vertebral arteries arise from subclavian arteries. No proximal subclavian artery stenosis. Vertebral arteries patent without stenosis or dissection. Skeleton: No discrete or worrisome osseous lesions. Reversal of the normal cervical lordosis with moderate to advanced spondylosis at C4-5 through C6-7. Few scattered chronic compression deformities noted within the visualized upper thoracic spine. Other neck: No other acute finding. Upper chest: 5 mm right upper lobe pulmonary nodule (series 2, image 145), indeterminate. No other acute finding. Review of the MIP images confirms the above findings CTA HEAD FINDINGS Anterior circulation: Mild atheromatous change about the carotid siphons without stenosis. A1 segments, anterior communicating complex common anterior cerebral arteries patent without stenosis. No M1 stenosis or occlusion. Distal MCA  branches perfused and symmetric. Posterior circulation: Both V4 segments patent without stenosis. Right PICA patent. Left PICA not well seen. Basilar patent without stenosis. Superior cerebellar and posterior cerebral arteries patent bilaterally. Venous sinuses: Grossly patent allowing for timing the contrast bolus. Anatomic variants: None significant.  No aneurysm. Review of the MIP images confirms the above findings IMPRESSION: 1. Negative CTA for large vessel occlusion or other emergent finding. 2. Mild for age atheromatous change about the carotid bifurcations and carotid siphons without hemodynamically significant or correctable stenosis. 3. Diffuse tortuosity of the major arterial vasculature of the head and neck, suggesting chronic underlying hypertension. 4. 5 mm right upper lobe pulmonary nodule, indeterminate. Per Fleischner Society Guidelines, if patient is low risk for malignancy, no routine follow-up imaging is recommended. If patient is high risk for malignancy, a non-contrast Chest CT at 12 months is optional. If performed and the nodule is stable at 12 months, no further follow-up is recommended. These guidelines do not apply to immunocompromised patients and patients with cancer. Follow up in patients with significant comorbidities as clinically warranted. For lung cancer screening, adhere to Lung-RADS guidelines. Reference: Radiology. 2017; 284(1):228-43. Electronically Signed   By: Rise Mu M.D.   On: 12/21/2022 22:19   CT CHEST WO CONTRAST  Result Date: 12/21/2022 CLINICAL DATA:  Episodes of recurrent sepsis; neurological deficit; history of breast cancer EXAM: CT CHEST WITHOUT CONTRAST TECHNIQUE: Multidetector CT imaging of the chest was performed following the standard protocol without IV contrast. RADIATION DOSE REDUCTION: This exam was performed according to the departmental dose-optimization program which includes automated exposure control, adjustment of the mA and/or kV  according to patient size and/or use of iterative reconstruction technique. COMPARISON:  CT abdomen and pelvis dated July 15, 2020 FINDINGS: Cardiovascular: Normal heart size. No pericardial effusion. Normal caliber thoracic aorta with mild calcified plaque. No coronary artery calcifications. Mediastinum/Nodes: Esophagus and thyroid are unremarkable. No enlarged lymph nodes seen in the chest. Lungs/Pleura: Central airways are patent. No consolidation, pleural effusion or pneumothorax. Small solid nodule of the right upper lobe measuring 5  mm on series 5, image 37. small solid nodule of the right lower lobe measuring 5 mm on image 77. Upper Abdomen: Multiple new suspicious lesions of the right hepatic lobe. Largest of the right hepatic dome measures 4.0 x 3.5 cm on series 4, image 104. Simple appearing cyst of the right kidney. Musculoskeletal: Medial right breast measuring 2.9 x 1.6 x 4.9 cm. No suspicious bone lesions identified. IMPRESSION: 1. No evidence of pneumonia. 2. Multiple suspicious lesions of the right hepatic lobe, which are new when compared with prior CT of the abdomen and pelvis July 15, 2020, likely metastatic disease given history of primary breast malignancy. 3. Calcified soft tissue mass of the medial right breast, possibly primary breast malignancy. Correlate with clinical history. 4. Nonspecific small solid nodules of the right upper and lower lobes measuring 5 mm. Recommend attention on follow-up per oncologic clinical protocol. 5. Aortic Atherosclerosis (ICD10-I70.0). Electronically Signed   By: Allegra Lai M.D.   On: 12/21/2022 22:06   CT HEAD CODE STROKE WO CONTRAST  Result Date: 12/21/2022 CLINICAL DATA:  Code stroke. Initial evaluation for neuro deficit, stroke suspected, speech changes. EXAM: CT HEAD WITHOUT CONTRAST TECHNIQUE: Contiguous axial images were obtained from the base of the skull through the vertex without intravenous contrast. RADIATION DOSE REDUCTION: This exam was  performed according to the departmental dose-optimization program which includes automated exposure control, adjustment of the mA and/or kV according to patient size and/or use of iterative reconstruction technique. COMPARISON:  Prior study from 12/19/2022. FINDINGS: Brain: Age-related cerebral atrophy with chronic small vessel ischemic disease. Multifocal chronic cortical infarcts involving the bilateral frontal lobes. Small remote left cerebellar infarct. No acute intracranial hemorrhage. No acute large vessel territory infarct. No mass lesion, midline shift or mass effect. No hydrocephalus or extra-axial fluid collection. Vascular: No abnormal hyperdense vessel. Scattered calcified atherosclerosis about the skull base. Skull: Scalp soft tissues within normal limits.  Calvarium intact. Sinuses/Orbits: Globes and orbital soft tissues within normal limits. Paranasal sinuses are clear. No mastoid effusion. Other: None. ASPECTS [facility] Stroke Program Early CT Score) - Ganglionic level infarction (caudate, lentiform nuclei, internal capsule, insula, M1-M3 cortex): 7 - Supraganglionic infarction (M4-M6 cortex): 3 Total score (0-10 with 10 being normal): 10 IMPRESSION: 1. No acute intracranial abnormality. 2. ASPECTS is 10. 3. Age-related cerebral atrophy with chronic small vessel ischemic disease, with multiple chronic infarcts involving the bilateral frontal lobes and left cerebellum. Results were called by telephone at the time of interpretation on 12/21/2022 at 8:24 pm to provider Shaune Pollack , who verbally acknowledged these results. Electronically Signed   By: Rise Mu M.D.   On: 12/21/2022 20:27     Assessment: 82 y.o. female with a PMHx of HTN, HLD, hypothyroidism, depression, migraine headaches, stroke, loop recorder insertion and UTI who re-presented to the ED yesterday night after acute onset of AMS with unresponsiveness a few hours following discharge from the hospital earlier the same day  after an admission for sepsis in the setting of FTT with weight loss and diffuse generalized weakness. She was sitting at the table on Saturday afternoon with family after her discharge, when suddenly she slumped over and became unresponsive. No seizure-like activity was noted. EMS was called and SBP was in the 70s-80s on their arrival. Symptoms improved with fluid boluses.  - Exam today reveals a drowsy patient with poor orientation, sparse speech, abulia and occasional perseveration. No focal motor weakness, facial droop or gross sensory deficit is appreciated.  - CT head: No acute  intracranial abnormality. ASPECTS is 10. Age-related cerebral atrophy with chronic small vessel ischemic disease. Multiple chronic infarcts involving the bilateral frontal lobes and left cerebellum. - CTA of head and neck: Negative CTA for large vessel occlusion or other emergent finding. Mild for age atheromatous change about the carotid bifurcations and carotid siphons without hemodynamically significant or correctable stenosis. Diffuse tortuosity of the major arterial vasculature of the head and neck, suggesting chronic underlying hypertension.  - WBC elevated at 10.7. Lactate was 4.5 on presentation and has improved but is still elevated at 2.0.  - DDx:  - In the context of her severe hypotension when initially assessed by EMS, followed by improvement in mentation and BP after fluid boluses, the most likely etiology for her presentation is acute hypotension leading to depressed level of consciousness, most likely secondary to transient brain hypoperfusion.  - Possible overlapping toxic-metabolic encephalopathy is also a consideration.   - Semiology of her event was not consistent with a seizure.   Recommendations: - IVF - Frequent neuro checks - Assess for possible recurrence of infection - MRI brain without contrast   Addendum: - MRI brain: Acute infarct in the left frontal lobe, immediately adjacent to a site of  a chronic left frontal lobe infarct. Mild contrast enhancement in this region likely represents a subacute component. No acute hemorrhage.  - Will need to complete her stroke work up with TTE (last obtained in April of 2022) and cardiac telemetry - Permissive HTN - HgbA1c, fasting lipid panel - PT consult, OT consult, Speech consult - Start ASA 81 mg po qd - Frequent neuro checks - NPO until passes stroke swallow screen  - Management of FTT, recent weight loss and other sequelae of her breast cancer per primary team. Patient has refused surgical and oncological management of her cancer in the past, and most likely it is progressing.  - Her son states that he wants her to be full DNR.     Electronically signed: Dr. Caryl Pina 12/22/2022, 8:19 AM

## 2022-12-22 NOTE — Evaluation (Signed)
Physical Therapy Evaluation Patient Details Name: Kaylee [name] MRN: [id number] DOB: 04-05-1941 Today's Date: 12/22/2022  History of Present Illness  H&P: Pt is an 82 y/o F admitted on 12/21/22 after presenting with c/o acute onset of AMS & unresponsiveness after being d/c earlier this afternoon. Per MD, MRI "shows acute left frontal infarct". Of note, pt with recent admission on 9/5 for management of severe sepsis, FTT, & metabolic acidosis. PMH: HTN, dyslipidemia, hypothyroidism, UTI, depression, migraine, CVA 2022, dementia, breast CA, MI  Clinical Impression  Pt seen for PT evaluation with co-tx with OT for pt & therapist's safety & 2/2 pt's impaired cognition. Pt's family present at beginning of session, providing PLOF & home set up information before stepping out. Pt appears to have impaired cognition with impaired orientation, ability to follow commands, decreased overall awareness. Pt with fluctuating levels of assistance, able to stand from EOB with supervision & RW but requires mod assist +2 for STS from recliner. Pt ambulates short distance in room with RW & min assist. Recommend ongoing PT services to address strengthening, balance, endurance, transfers & gait with LRAD.        If plan is discharge home, recommend the following: A little help with walking and/or transfers;A little help with bathing/dressing/bathroom;Assistance with cooking/housework;Direct supervision/assist for medications management;Assist for transportation;Help with stairs or ramp for entrance   Can travel by private vehicle   No    Equipment Recommendations None recommended by PT (defer to next venue)  Recommendations for Other Services       Functional Status Assessment Patient has had a recent decline in their functional status and demonstrates the ability to make significant improvements in function in a reasonable and predictable amount of time.     Precautions / Restrictions Precautions Precautions:  Fall Restrictions Weight Bearing Restrictions: No      Mobility  Bed Mobility Overal bed mobility: Needs Assistance Bed Mobility: Supine to Sit     Supine to sit: Supervision, HOB elevated, Used rails          Transfers Overall transfer level: Needs assistance Equipment used: Rolling walker (2 wheels) Transfers: Sit to/from Stand Sit to Stand: Supervision, Mod assist, +2 physical assistance   Step pivot transfers: Contact guard assist (bed>recliner on R with RW)       General transfer comment: STS from EOB with RW & supervision, STS from recliner with mod assist +2    Ambulation/Gait Ambulation/Gait assistance: Min assist Gait Distance (Feet): 12 Feet Assistive device: Rolling walker (2 wheels) Gait Pattern/deviations: Trunk flexed, Step-through pattern, Decreased step length - right, Decreased step length - left, Decreased dorsiflexion - right, Decreased dorsiflexion - left, Decreased stride length Gait velocity: decreased     General Gait Details: Pushing RW out in front of her.  Stairs            Wheelchair Mobility     Tilt Bed    Modified Rankin (Stroke Patients Only)       Balance Overall balance assessment: Needs assistance Sitting-balance support: No upper extremity supported, Feet supported Sitting balance-Leahy Scale: Good     Standing balance support: During functional activity, Single extremity supported, Bilateral upper extremity supported Standing balance-Leahy Scale: Poor                               Pertinent Vitals/Pain Pain Assessment Pain Assessment: No/denies pain    Home Living Family/patient expects to be discharged to:: Private  residence Living Arrangements: Other relatives (sisters) Available Help at Discharge: Family Type of Home: House Home Access: Stairs to enter Entrance Stairs-Rails: Doctor, general practice of Steps: 5 steps at sunroom entrance with B rails   Home Layout: One  level Home Equipment: Agricultural consultant (2 wheels);Cane - single point;Shower seat      Prior Function               Mobility Comments: Pt ambulated with cane prior to admission, ADLs Comments: Sisters assisted with bathing     Extremity/Trunk Assessment   Upper Extremity Assessment Upper Extremity Assessment:  (decreased finger to nose with LUE)    Lower Extremity Assessment Lower Extremity Assessment: Generalized weakness       Communication   Communication Following commands: Follows one step commands with increased time;Follows one step commands inconsistently Cueing Techniques: Verbal cues;Gestural cues;Tactile cues;Visual cues  Cognition Arousal: Alert Behavior During Therapy: WFL for tasks assessed/performed Overall Cognitive Status: No family/caregiver present to determine baseline cognitive functioning (family stepped out of room)                                 General Comments: Pt oriented to self & location, lacks sustained attention, impaired awareness.        General Comments General comments (skin integrity, edema, etc.): Skin impairment noted at upper mid chest (pt unable to state what it was)    Exercises     Assessment/Plan    PT Assessment Patient needs continued PT services  PT Problem List Decreased strength;Decreased coordination;Decreased range of motion;Decreased cognition;Decreased activity tolerance;Decreased knowledge of use of DME;Decreased balance;Decreased safety awareness;Decreased mobility;Decreased knowledge of precautions;Decreased skin integrity       PT Treatment Interventions DME instruction;Gait training;Stair training;Functional mobility training;Therapeutic activities;Therapeutic exercise;Balance training;Patient/family education;Neuromuscular re-education;Manual techniques;Cognitive remediation;Modalities    PT Goals (Current goals can be found in the Care Plan section)  Acute Rehab PT Goals Patient Stated  Goal: to get better PT Goal Formulation: With patient/family Time For Goal Achievement: 01/05/23 Potential to Achieve Goals: Fair    Frequency Min 1X/week     Co-evaluation PT/OT/SLP Co-Evaluation/Treatment: Yes Reason for Co-Treatment: Complexity of the patient's impairments (multi-system involvement);Necessary to address cognition/behavior during functional activity;For patient/therapist safety PT goals addressed during session: Mobility/safety with mobility;Balance         AM-PAC PT "6 Clicks" Mobility  Outcome Measure Help needed turning from your back to your side while in a flat bed without using bedrails?: None Help needed moving from lying on your back to sitting on the side of a flat bed without using bedrails?: A Little Help needed moving to and from a bed to a chair (including a wheelchair)?: A Little Help needed standing up from a chair using your arms (e.g., wheelchair or bedside chair)?: A Lot Help needed to walk in hospital room?: A Little Help needed climbing 3-5 steps with a railing? : Total 6 Click Score: 16    End of Session   Activity Tolerance: Patient limited by fatigue Patient left: in chair;with chair alarm set;with call bell/phone within reach Nurse Communication: Mobility status PT Visit Diagnosis: Unsteadiness on feet (R26.81);Other abnormalities of gait and mobility (R26.89);Muscle weakness (generalized) (M62.81);Difficulty in walking, not elsewhere classified (R26.2)    Time: 0102-7253 PT Time Calculation (min) (ACUTE ONLY): 23 min   Charges:   PT Evaluation $PT Eval Low Complexity: 1 Low   PT General Charges $$ ACUTE PT VISIT: 1  Visit         Aleda Grana, PT, DPT 12/22/22, 3:11 PM   Sandi Mariscal 12/22/2022, 3:10 PM

## 2022-12-22 NOTE — Progress Notes (Signed)
[facility]  - [place] [facility]   PATIENT NAME: Kaylee [name]    MR#:  [id number]  DATE OF BIRTH:  December 21, 1940  SUBJECTIVE:  Family in the room (sisters and son). Pt awake and alert. Wants to go home Vitals ok Oriented to place and person Tells me it is 1979 and August No other complaints    VITALS:  Blood pressure (!) 160/87, pulse (!) 106, temperature 97.7 F (36.5 C), resp. rate 18, height 5\' 2"  (1.575 m), weight 63.9 kg, SpO2 92%.  PHYSICAL EXAMINATION:   GENERAL:  82 y.o.-year-old patient with no acute distress.  LUNGS: Normal breath sounds bilaterally, no wheezing CARDIOVASCULAR: S1, S2 normal. No murmur   ABDOMEN: Soft, nontender, nondistended. Bowel sounds present.  EXTREMITIES: No  edema b/l.    NEUROLOGIC: nonfocal  patient is alert and awake SKIN: No obvious rash, lesion, or ulcer.   LABORATORY PANEL:  CBC Recent Labs  Lab 12/22/22 0356  WBC 10.7*  HGB 9.2*  HCT 30.0*  PLT 201    Chemistries  Recent Labs  Lab 12/21/22 1924 12/22/22 0356  NA 135 138  K 3.4* 3.6  CL 98 105  CO2 23 25  GLUCOSE 162* 143*  BUN 18 15  CREATININE 1.20* 0.94  CALCIUM 10.1 9.5  AST 51*  --   ALT 16  --   ALKPHOS 83  --   BILITOT 0.6  --    Cardiac Enzymes No results for input(s): "TROPONINI" in the last 168 hours. RADIOLOGY:  CT ANGIO HEAD NECK W WO CM  Result Date: 12/21/2022 CLINICAL DATA:  Initial evaluation for neuro deficit, stroke suspected. EXAM: CT ANGIOGRAPHY HEAD AND NECK WITH AND WITHOUT CONTRAST TECHNIQUE: Multidetector CT imaging of the head and neck was performed using the standard protocol during bolus administration of intravenous contrast. Multiplanar CT image reconstructions and MIPs were obtained to evaluate the vascular anatomy. Carotid stenosis measurements (when applicable) are obtained utilizing NASCET criteria, using the distal internal carotid diameter as the denominator. RADIATION DOSE REDUCTION: This exam was performed  according to the departmental dose-optimization program which includes automated exposure control, adjustment of the mA and/or kV according to patient size and/or use of iterative reconstruction technique. CONTRAST:  75mL OMNIPAQUE IOHEXOL 350 MG/ML SOLN COMPARISON:  CT from earlier the same day as well as prior CTA from 08/12/2020. FINDINGS: CTA NECK FINDINGS Aortic arch: Visualized aortic arch within normal limits for caliber with standard branch pattern. No stenosis about the origin of the great vessels. Right carotid system: Right common and internal carotid arteries are tortuous without evidence for dissection. Mild for age atheromatous change about the right carotid bulb without hemodynamically significant stenosis. Left carotid system: Left common and internal carotid arteries are tortuous but patent without dissection. Mild for age atheromatous change about the left carotid bulb without hemodynamically significant stenosis. Vertebral arteries: Both vertebral arteries arise from subclavian arteries. No proximal subclavian artery stenosis. Vertebral arteries patent without stenosis or dissection. Skeleton: No discrete or worrisome osseous lesions. Reversal of the normal cervical lordosis with moderate to advanced spondylosis at C4-5 through C6-7. Few scattered chronic compression deformities noted within the visualized upper thoracic spine. Other neck: No other acute finding. Upper chest: 5 mm right upper lobe pulmonary nodule (series 2, image 145), indeterminate. No other acute finding. Review of the MIP images confirms the above findings CTA HEAD FINDINGS Anterior circulation: Mild atheromatous change about the carotid siphons without stenosis. A1 segments, anterior communicating complex common anterior cerebral arteries  patent without stenosis. No M1 stenosis or occlusion. Distal MCA branches perfused and symmetric. Posterior circulation: Both V4 segments patent without stenosis. Right PICA patent. Left  PICA not well seen. Basilar patent without stenosis. Superior cerebellar and posterior cerebral arteries patent bilaterally. Venous sinuses: Grossly patent allowing for timing the contrast bolus. Anatomic variants: None significant.  No aneurysm. Review of the MIP images confirms the above findings IMPRESSION: 1. Negative CTA for large vessel occlusion or other emergent finding. 2. Mild for age atheromatous change about the carotid bifurcations and carotid siphons without hemodynamically significant or correctable stenosis. 3. Diffuse tortuosity of the major arterial vasculature of the head and neck, suggesting chronic underlying hypertension. 4. 5 mm right upper lobe pulmonary nodule, indeterminate. Per Fleischner Society Guidelines, if patient is low risk for malignancy, no routine follow-up imaging is recommended. If patient is high risk for malignancy, a non-contrast Chest CT at 12 months is optional. If performed and the nodule is stable at 12 months, no further follow-up is recommended. These guidelines do not apply to immunocompromised patients and patients with cancer. Follow up in patients with significant comorbidities as clinically warranted. For lung cancer screening, adhere to Lung-RADS guidelines. Reference: Radiology. 2017; 284(1):228-43. Electronically Signed   By: Rise Mu M.D.   On: 12/21/2022 22:19   CT CHEST WO CONTRAST  Result Date: 12/21/2022 CLINICAL DATA:  Episodes of recurrent sepsis; neurological deficit; history of breast cancer EXAM: CT CHEST WITHOUT CONTRAST TECHNIQUE: Multidetector CT imaging of the chest was performed following the standard protocol without IV contrast. RADIATION DOSE REDUCTION: This exam was performed according to the departmental dose-optimization program which includes automated exposure control, adjustment of the mA and/or kV according to patient size and/or use of iterative reconstruction technique. COMPARISON:  CT abdomen and pelvis dated July 15, 2020 FINDINGS: Cardiovascular: Normal heart size. No pericardial effusion. Normal caliber thoracic aorta with mild calcified plaque. No coronary artery calcifications. Mediastinum/Nodes: Esophagus and thyroid are unremarkable. No enlarged lymph nodes seen in the chest. Lungs/Pleura: Central airways are patent. No consolidation, pleural effusion or pneumothorax. Small solid nodule of the right upper lobe measuring 5 mm on series 5, image 37. small solid nodule of the right lower lobe measuring 5 mm on image 77. Upper Abdomen: Multiple new suspicious lesions of the right hepatic lobe. Largest of the right hepatic dome measures 4.0 x 3.5 cm on series 4, image 104. Simple appearing cyst of the right kidney. Musculoskeletal: Medial right breast measuring 2.9 x 1.6 x 4.9 cm. No suspicious bone lesions identified. IMPRESSION: 1. No evidence of pneumonia. 2. Multiple suspicious lesions of the right hepatic lobe, which are new when compared with prior CT of the abdomen and pelvis July 15, 2020, likely metastatic disease given history of primary breast malignancy. 3. Calcified soft tissue mass of the medial right breast, possibly primary breast malignancy. Correlate with clinical history. 4. Nonspecific small solid nodules of the right upper and lower lobes measuring 5 mm. Recommend attention on follow-up per oncologic clinical protocol. 5. Aortic Atherosclerosis (ICD10-I70.0). Electronically Signed   By: Allegra Lai M.D.   On: 12/21/2022 22:06   CT HEAD CODE STROKE WO CONTRAST  Result Date: 12/21/2022 CLINICAL DATA:  Code stroke. Initial evaluation for neuro deficit, stroke suspected, speech changes. EXAM: CT HEAD WITHOUT CONTRAST TECHNIQUE: Contiguous axial images were obtained from the base of the skull through the vertex without intravenous contrast. RADIATION DOSE REDUCTION: This exam was performed according to the departmental dose-optimization program which includes automated  exposure control, adjustment of the  mA and/or kV according to patient size and/or use of iterative reconstruction technique. COMPARISON:  Prior study from 12/19/2022. FINDINGS: Brain: Age-related cerebral atrophy with chronic small vessel ischemic disease. Multifocal chronic cortical infarcts involving the bilateral frontal lobes. Small remote left cerebellar infarct. No acute intracranial hemorrhage. No acute large vessel territory infarct. No mass lesion, midline shift or mass effect. No hydrocephalus or extra-axial fluid collection. Vascular: No abnormal hyperdense vessel. Scattered calcified atherosclerosis about the skull base. Skull: Scalp soft tissues within normal limits.  Calvarium intact. Sinuses/Orbits: Globes and orbital soft tissues within normal limits. Paranasal sinuses are clear. No mastoid effusion. Other: None. ASPECTS [facility] Stroke Program Early CT Score) - Ganglionic level infarction (caudate, lentiform nuclei, internal capsule, insula, M1-M3 cortex): 7 - Supraganglionic infarction (M4-M6 cortex): 3 Total score (0-10 with 10 being normal): 10 IMPRESSION: 1. No acute intracranial abnormality. 2. ASPECTS is 10. 3. Age-related cerebral atrophy with chronic small vessel ischemic disease, with multiple chronic infarcts involving the bilateral frontal lobes and left cerebellum. Results were called by telephone at the time of interpretation on 12/21/2022 at 8:24 pm to provider Shaune Pollack , who verbally acknowledged these results. Electronically Signed   By: Rise Mu M.D.   On: 12/21/2022 20:27    Assessment and Plan  Marielis L Ilyas is a 82 y.o. African-American female with medical history significant for  hypertension, dyslipidemia, hypothyroidism, UTI and depression, who presented to the emergency room with acute onset of altered mental status with unresponsiveness after she was discharged earlier this afternoon.  The patient apparently slumped down and was unresponsive.  Her blood pressure was 70s /50s    Noncontrasted chest CT revealed the following: 1. No evidence of pneumonia. 2. Multiple suspicious lesions of the right hepatic lobe, which are new when compared with prior CT of the abdomen and pelvis July 15, 2020, likely metastatic disease given history of primary breast malignancy. 3. Calcified soft tissue mass of the medial right breast, possibly primary breast malignancy. Correlate with clinical history. 4. Nonspecific small solid nodules of the right upper and lower lobes measuring 5 mm. Recommend attention on follow-up per oncologic clinical protocol. 5. Aortic Atherosclerosis.   CTA of the head and neck revealed the following: 1. Negative CTA for large vessel occlusion or other emergent finding. 2. Mild for age atheromatous change about the carotid bifurcations and carotid siphons without hemodynamically significant or correctable stenosis. 3. Diffuse tortuosity of the major arterial vasculature of the head and neck, suggesting chronic underlying hypertension. 4. 5 mm right upper lobe pulmonary nodule, indeterminate.   MRI BRain: prelim report shows Left frontal lobe infarct adjacent to the chronic infarct.   Acute CVA, Left frontal lobe, likely thrombotic --came in with low bp and confusion --Now alert and oriented x2 --start ASA 81 mg and statin --pt not interested in getting much extensive w/u --Echo ordered --CTA head/neck no major blockage --PT/OT and SLP tp see  H/o know Breast cancer and now multiple liver lesions worrisome for metastasis --family informed of results and pt has opted 2 years ago not to pursue any treatment  HTN --on amlodipine at home (just started 2 days ago)  UTI -- complete IV bxs 2 more doses   Overall improving.  Likely d/c in am if stable   Procedures:none Family communication :son, sisers Consults :none CODE STATUS: DNr DVT Prophylaxis :lovenox Level of care: Telemetry Medical Status is: Inpatient Remains inpatient  appropriate because: acute CVA  TOTAL TIME TAKING CARE OF THIS PATIENT: 35 minutes.  >50% time spent on counselling and coordination of care  Note: This dictation was prepared with Dragon dictation along with smaller phrase technology. Any transcriptional errors that result from this process are unintentional.  Enedina Finner M.D    [facility]   CC: Primary care physician; Patient, No Pcp Per

## 2022-12-22 NOTE — Plan of Care (Signed)
  Problem: Nutrition: Goal: Adequate nutrition will be maintained Outcome: Progressing   Problem: Nutrition: Goal: Adequate nutrition will be maintained Outcome: Progressing   Problem: Coping: Goal: Level of anxiety will decrease Outcome: Progressing   Problem: Elimination: Goal: Will not experience complications related to bowel motility Outcome: Progressing   Problem: Coping: Goal: Level of anxiety will decrease Outcome: Progressing   Problem: Elimination: Goal: Will not experience complications related to bowel motility Outcome: Progressing

## 2022-12-22 NOTE — Progress Notes (Signed)
SLP Cancellation Note  Patient Details Name: Kaylee [name] MRN: [id number] DOB: March 16, 1941   Cancelled treatment:       Reason Eval/Treat Not Completed: Fatigue/lethargy limiting ability to participate (Pt sleeping soundly. Son requesting for SLP to return at a later time. Will continue efforts as appropriate.)  Clyde Canterbury, M.S., CCC-SLP Speech-Language Pathologist [facility] [phone] Arnette Felts)  Woodroe Chen 12/22/2022, 9:30 AM

## 2022-12-23 ENCOUNTER — Inpatient Hospital Stay
Admit: 2022-12-23 | Discharge: 2022-12-23 | Disposition: A | Discharge status: Home / Self Care | Payer: Medicare Other | Attending: Internal Medicine | Admitting: Internal Medicine

## 2022-12-23 ENCOUNTER — Inpatient Hospital Stay: Admit: 2022-12-23 | Payer: Medicare Other

## 2022-12-23 DIAGNOSIS — I10 Essential (primary) hypertension: Secondary | ICD-10-CM | POA: Diagnosis not present

## 2022-12-23 DIAGNOSIS — E039 Hypothyroidism, unspecified: Secondary | ICD-10-CM | POA: Diagnosis not present

## 2022-12-23 DIAGNOSIS — I639 Cerebral infarction, unspecified: Secondary | ICD-10-CM | POA: Diagnosis not present

## 2022-12-23 LAB — URINE CULTURE: Culture: NO GROWTH

## 2022-12-23 LAB — GLUCOSE, CAPILLARY: Glucose-Capillary: 77 mg/dL (ref 70–99)

## 2022-12-23 MED ORDER — ATORVASTATIN CALCIUM 20 MG PO TABS: 20.0000 mg | ORAL_TABLET | Freq: Every day | ORAL | 0 refills | Status: DC

## 2022-12-23 MED ORDER — ASPIRIN 81 MG PO TBEC: 81.0000 mg | DELAYED_RELEASE_TABLET | Freq: Every day | ORAL | 2 refills | Status: DC

## 2022-12-23 MED ORDER — ASPIRIN 81 MG PO TBEC
81.0000 mg | DELAYED_RELEASE_TABLET | Freq: Every day | ORAL | Status: DC
  Administered 2022-12-23: 81 mg via ORAL
  Filled 2022-12-23: qty 1

## 2022-12-23 NOTE — Progress Notes (Signed)
Per Dr Posey Pronto, d.c tele monitoring

## 2022-12-23 NOTE — Discharge Summary (Signed)
Physician Discharge Summary   Patient: Kaylee [name] MRN: [id number] DOB: 1940/07/02  Admit date:     12/21/2022  Discharge date: 12/23/22  Discharge Physician: Enedina Finner   PCP: Patient, No Pcp Per   Recommendations at discharge:    Establish PCP in the area Keep log of BP at home  Discharge Diagnoses: Principal Problem:   Syncope Active Problems:   Hypothyroidism   Essential hypertension   Hypokalemia   Coronary artery disease   Acute CVA (cerebrovascular accident) [facility])   Assessment and Plan:  Kaylee [name] is a 82 y.o. African-American female with medical history significant for  hypertension, dyslipidemia, hypothyroidism, UTI and depression, who presented to the emergency room with acute onset of altered mental status with unresponsiveness after she was discharged earlier this afternoon.  The patient apparently slumped down and was unresponsive.  Her blood pressure was 70s /50s    Noncontrasted chest CT revealed the following: 1. No evidence of pneumonia. 2. Multiple suspicious lesions of the right hepatic lobe, which are new when compared with prior CT of the abdomen and pelvis July 15, 2020, likely metastatic disease given history of primary breast malignancy. 3. Calcified soft tissue mass of the medial right breast, possibly primary breast malignancy. Correlate with clinical history. 4. Nonspecific small solid nodules of the right upper and lower lobes measuring 5 mm. Recommend attention on follow-up per oncologic clinical protocol. 5. Aortic Atherosclerosis.   CTA of the head and neck revealed the following: 1. Negative CTA for large vessel occlusion or other emergent finding. 2. Mild for age atheromatous change about the carotid bifurcations and carotid siphons without hemodynamically significant or correctable stenosis. 3. Diffuse tortuosity of the major arterial vasculature of the head and neck, suggesting chronic underlying hypertension. 4. 5 mm right  upper lobe pulmonary nodule, indeterminate.    MRI [facility]: prelim report shows Left frontal lobe infarct adjacent to the chronic infarct.    Acute CVA, Left frontal lobe, likely thrombotic --came in with low bp and confusion --Now alert and oriented x2 --start ASA 81 mg and statin--d/w son and he si not sure if pt will take meds. Told him to offer atleast --pt not interested in getting much extensive w/u and therfore Neurology not consulted --Echo ordered--pt refused --CTA head/neck no major blockage --PT/OT and SLP--pt refused to work and son at bedside is aware   H/o know Breast cancer and now multiple liver lesions worrisome for metastasis --family informed of results and pt has opted 2 years ago not to pursue any treatment   HTN --on amlodipine at home (just started 2 days ago)   UTI -- completed IV abxs      Overall stable.      Procedures:none Family communication :son Leotis Shames at bedside Consults :none CODE STATUS: DNR DVT Prophylaxis :lovenox    Diet recommendation:  Discharge Diet Orders (From admission, onward)     Start     Ordered   12/23/22 0000  Diet - low sodium heart healthy        12/23/22 1224            DISCHARGE MEDICATION: Allergies as of 12/23/2022   No Known Allergies      Medication List     STOP taking these medications    cephALEXin 500 MG capsule Commonly known as: KEFLEX       TAKE these medications    amLODipine 5 MG tablet Commonly known as: NORVASC Take 1 tablet (5 mg total) by  mouth daily.   aspirin EC 81 MG tablet Take 1 tablet (81 mg total) by mouth daily. Swallow whole.   atorvastatin 20 MG tablet Commonly known as: LIPITOR Take 1 tablet (20 mg total) by mouth daily. Start taking on: December 24, 2022   feeding supplement Liqd Take 237 mLs by mouth 2 (two) times daily between meals.   multivitamin with minerals Tabs tablet Take 1 tablet by mouth daily.   Vitamin D3 50 MCG (2000 UT) capsule Take 1  capsule (2,000 Units total) by mouth daily.        Discharge Exam: Filed Weights   12/22/22 0428 12/22/22 0500 12/23/22 0216  Weight: 63.5 kg 63.9 kg 65 kg   GENERAL:  82 y.o.-year-old patient with no acute distress.  LUNGS: Normal breath sounds bilaterally, no wheezing CARDIOVASCULAR: S1, S2 normal. No murmur   ABDOMEN: Soft, nontender, nondistended. EXTREMITIES: No  edema b/l.    NEUROLOGIC: nonfocal  patient is alert and awake pt does not want to participate  Condition at discharge: fair  The results of significant diagnostics from this hospitalization (including imaging, microbiology, ancillary and laboratory) are listed below for reference.   Imaging Studies: MR BRAIN W WO CONTRAST  Result Date: 12/22/2022 CLINICAL DATA:  Mental status change, unknown cause EXAM: MRI HEAD WITHOUT AND WITH CONTRAST TECHNIQUE: Multiplanar, multiecho pulse sequences of the brain and surrounding structures were obtained without and with intravenous contrast. CONTRAST:  6mL GADAVIST GADOBUTROL 1 MMOL/ML IV SOLN COMPARISON:  MR Head 07/16/20 FINDINGS: Brain: There is an acute infarct in the left frontal lobe, immediately adjacent to a site of a chronic left frontal lobe infarct. This mild contrast enhancement in this region (series 17, image 19), which likely represents a subacute component. No acute hemorrhage. Unchanged chronic hemorrhage in the left parietal lobe. 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 No hydrocephalus. No extra-axial fluid collection. There are chronic infarcts in the left cerebellar hemisphere. Vascular: Normal flow voids. Skull and upper cervical spine: Normal marrow signal. Sinuses/Orbits: No middle ear or mastoid effusion. Paranasal sinuses are clear. Orbits are unremarkable. Other: None. IMPRESSION: Acute infarct in the left frontal lobe, immediately adjacent to a site of a chronic left frontal lobe infarct. Mild contrast enhancement in this region  likely represents a subacute component. No acute hemorrhage. These results will be called to the ordering clinician or representative by the Radiologist Assistant, and communication documented in the PACS or [employer]. Electronically Signed   By: Lorenza Cambridge M.D.   On: 12/22/2022 13:42   CT ANGIO HEAD NECK W WO CM  Result Date: 12/21/2022 CLINICAL DATA:  Initial evaluation for neuro deficit, stroke suspected. EXAM: CT ANGIOGRAPHY HEAD AND NECK WITH AND WITHOUT CONTRAST TECHNIQUE: Multidetector CT imaging of the head and neck was performed using the standard protocol during bolus administration of intravenous contrast. Multiplanar CT image reconstructions and MIPs were obtained to evaluate the vascular anatomy. Carotid stenosis measurements (when applicable) are obtained utilizing NASCET criteria, using the distal internal carotid diameter as the denominator. RADIATION DOSE REDUCTION: This exam was performed according to the departmental dose-optimization program which includes automated exposure control, adjustment of the mA and/or kV according to patient size and/or use of iterative reconstruction technique. CONTRAST:  75mL OMNIPAQUE IOHEXOL 350 MG/ML SOLN COMPARISON:  CT from earlier the same day as well as prior CTA from 08/12/2020. FINDINGS: CTA NECK FINDINGS Aortic arch: Visualized aortic arch within normal limits for caliber with standard branch pattern. No stenosis about  the origin of the great vessels. Right carotid system: Right common and internal carotid arteries are tortuous without evidence for dissection. Mild for age atheromatous change about the right carotid bulb without hemodynamically significant stenosis. Left carotid system: Left common and internal carotid arteries are tortuous but patent without dissection. Mild for age atheromatous change about the left carotid bulb without hemodynamically significant stenosis. Vertebral arteries: Both vertebral arteries arise from subclavian  arteries. No proximal subclavian artery stenosis. Vertebral arteries patent without stenosis or dissection. Skeleton: No discrete or worrisome osseous lesions. Reversal of the normal cervical lordosis with moderate to advanced spondylosis at C4-5 through C6-7. Few scattered chronic compression deformities noted within the visualized upper thoracic spine. Other neck: No other acute finding. Upper chest: 5 mm right upper lobe pulmonary nodule (series 2, image 145), indeterminate. No other acute finding. Review of the MIP images confirms the above findings CTA HEAD FINDINGS Anterior circulation: Mild atheromatous change about the carotid siphons without stenosis. A1 segments, anterior communicating complex common anterior cerebral arteries patent without stenosis. No M1 stenosis or occlusion. Distal MCA branches perfused and symmetric. Posterior circulation: Both V4 segments patent without stenosis. Right PICA patent. Left PICA not well seen. Basilar patent without stenosis. Superior cerebellar and posterior cerebral arteries patent bilaterally. Venous sinuses: Grossly patent allowing for timing the contrast bolus. Anatomic variants: None significant.  No aneurysm. Review of the MIP images confirms the above findings IMPRESSION: 1. Negative CTA for large vessel occlusion or other emergent finding. 2. Mild for age atheromatous change about the carotid bifurcations and carotid siphons without hemodynamically significant or correctable stenosis. 3. Diffuse tortuosity of the major arterial vasculature of the head and neck, suggesting chronic underlying hypertension. 4. 5 mm right upper lobe pulmonary nodule, indeterminate. Per Fleischner Society Guidelines, if patient is low risk for malignancy, no routine follow-up imaging is recommended. If patient is high risk for malignancy, a non-contrast Chest CT at 12 months is optional. If performed and the nodule is stable at 12 months, no further follow-up is recommended. These  guidelines do not apply to immunocompromised patients and patients with cancer. Follow up in patients with significant comorbidities as clinically warranted. For lung cancer screening, adhere to Lung-RADS guidelines. Reference: Radiology. 2017; 284(1):228-43. Electronically Signed   By: Rise Mu M.D.   On: 12/21/2022 22:19   CT CHEST WO CONTRAST  Result Date: 12/21/2022 CLINICAL DATA:  Episodes of recurrent sepsis; neurological deficit; history of breast cancer EXAM: CT CHEST WITHOUT CONTRAST TECHNIQUE: Multidetector CT imaging of the chest was performed following the standard protocol without IV contrast. RADIATION DOSE REDUCTION: This exam was performed according to the departmental dose-optimization program which includes automated exposure control, adjustment of the mA and/or kV according to patient size and/or use of iterative reconstruction technique. COMPARISON:  CT abdomen and pelvis dated July 15, 2020 FINDINGS: Cardiovascular: Normal heart size. No pericardial effusion. Normal caliber thoracic aorta with mild calcified plaque. No coronary artery calcifications. Mediastinum/Nodes: Esophagus and thyroid are unremarkable. No enlarged lymph nodes seen in the chest. Lungs/Pleura: Central airways are patent. No consolidation, pleural effusion or pneumothorax. Small solid nodule of the right upper lobe measuring 5 mm on series 5, image 37. small solid nodule of the right lower lobe measuring 5 mm on image 77. Upper Abdomen: Multiple new suspicious lesions of the right hepatic lobe. Largest of the right hepatic dome measures 4.0 x 3.5 cm on series 4, image 104. Simple appearing cyst of the right kidney. Musculoskeletal: Medial right breast measuring  2.9 x 1.6 x 4.9 cm. No suspicious bone lesions identified. IMPRESSION: 1. No evidence of pneumonia. 2. Multiple suspicious lesions of the right hepatic lobe, which are new when compared with prior CT of the abdomen and pelvis July 15, 2020, likely  metastatic disease given history of primary breast malignancy. 3. Calcified soft tissue mass of the medial right breast, possibly primary breast malignancy. Correlate with clinical history. 4. Nonspecific small solid nodules of the right upper and lower lobes measuring 5 mm. Recommend attention on follow-up per oncologic clinical protocol. 5. Aortic Atherosclerosis (ICD10-I70.0). Electronically Signed   By: Allegra Lai M.D.   On: 12/21/2022 22:06   CT HEAD CODE STROKE WO CONTRAST  Result Date: 12/21/2022 CLINICAL DATA:  Code stroke. Initial evaluation for neuro deficit, stroke suspected, speech changes. EXAM: CT HEAD WITHOUT CONTRAST TECHNIQUE: Contiguous axial images were obtained from the base of the skull through the vertex without intravenous contrast. RADIATION DOSE REDUCTION: This exam was performed according to the departmental dose-optimization program which includes automated exposure control, adjustment of the mA and/or kV according to patient size and/or use of iterative reconstruction technique. COMPARISON:  Prior study from 12/19/2022. FINDINGS: Brain: Age-related cerebral atrophy with chronic small vessel ischemic disease. Multifocal chronic cortical infarcts involving the bilateral frontal lobes. Small remote left cerebellar infarct. No acute intracranial hemorrhage. No acute large vessel territory infarct. No mass lesion, midline shift or mass effect. No hydrocephalus or extra-axial fluid collection. Vascular: No abnormal hyperdense vessel. Scattered calcified atherosclerosis about the skull base. Skull: Scalp soft tissues within normal limits.  Calvarium intact. Sinuses/Orbits: Globes and orbital soft tissues within normal limits. Paranasal sinuses are clear. No mastoid effusion. Other: None. ASPECTS [facility] Stroke Program Early CT Score) - Ganglionic level infarction (caudate, lentiform nuclei, internal capsule, insula, M1-M3 cortex): 7 - Supraganglionic infarction (M4-M6 cortex): 3 Total  score (0-10 with 10 being normal): 10 IMPRESSION: 1. No acute intracranial abnormality. 2. ASPECTS is 10. 3. Age-related cerebral atrophy with chronic small vessel ischemic disease, with multiple chronic infarcts involving the bilateral frontal lobes and left cerebellum. Results were called by telephone at the time of interpretation on 12/21/2022 at 8:24 pm to provider Shaune Pollack , who verbally acknowledged these results. Electronically Signed   By: Rise Mu M.D.   On: 12/21/2022 20:27   CT Head Wo Contrast  Result Date: 12/19/2022 CLINICAL DATA:  Fall at home. Patient is acting confused which is not baseline. Wound on chest. EXAM: CT HEAD WITHOUT CONTRAST CT CERVICAL SPINE WITHOUT CONTRAST TECHNIQUE: Multidetector CT imaging of the head and cervical spine was performed following the standard protocol without intravenous contrast. Multiplanar CT image reconstructions of the cervical spine were also generated. RADIATION DOSE REDUCTION: This exam was performed according to the departmental dose-optimization program which includes automated exposure control, adjustment of the mA and/or kV according to patient size and/or use of iterative reconstruction technique. COMPARISON:  CTA head and neck 07/16/2020 FINDINGS: CT HEAD FINDINGS Brain: No intracranial hemorrhage, mass effect, or evidence of acute infarct. No hydrocephalus. No extra-axial fluid collection. Age related cerebral atrophy and chronic small vessel ischemic disease. Chronic bilateral frontal lobe infarcts. Remote lacunar infarct left cerebellum. Vascular: No hyperdense vessel. Intracranial arterial calcification. Skull: No fracture or focal lesion. Sinuses/Orbits: No acute finding. Other: None. CT CERVICAL SPINE FINDINGS Alignment: No evidence of traumatic malalignment. Loss of lordosis is likely chronic/positional. Skull base and vertebrae: No acute fracture. No primary bone lesion or focal pathologic process. Soft tissues and spinal  canal:  No prevertebral fluid or swelling. No visible canal hematoma. Disc levels: Multilevel spondylosis, disc space height loss, and degenerative endplate changes greatest at C5-C6 and C6-C7 where it is advanced. Multilevel facet arthropathy most advanced on the right at C2-C3. Spinal canal narrowing is greatest at C6-C7 secondary to posterior disc osteophyte complex where it is moderate. Upper chest: No acute abnormality. Other: Carotid calcification. IMPRESSION: 1. No acute intracranial abnormality. Generalized atrophy, small vessel white matter disease, and chronic infarcts. 2. No acute fracture in the cervical spine. Multilevel degenerative spondylosis. Electronically Signed   By: Minerva Fester M.D.   On: 12/19/2022 20:41   CT Cervical Spine Wo Contrast  Result Date: 12/19/2022 CLINICAL DATA:  Fall at home. Patient is acting confused which is not baseline. Wound on chest. EXAM: CT HEAD WITHOUT CONTRAST CT CERVICAL SPINE WITHOUT CONTRAST TECHNIQUE: Multidetector CT imaging of the head and cervical spine was performed following the standard protocol without intravenous contrast. Multiplanar CT image reconstructions of the cervical spine were also generated. RADIATION DOSE REDUCTION: This exam was performed according to the departmental dose-optimization program which includes automated exposure control, adjustment of the mA and/or kV according to patient size and/or use of iterative reconstruction technique. COMPARISON:  CTA head and neck 07/16/2020 FINDINGS: CT HEAD FINDINGS Brain: No intracranial hemorrhage, mass effect, or evidence of acute infarct. No hydrocephalus. No extra-axial fluid collection. Age related cerebral atrophy and chronic small vessel ischemic disease. Chronic bilateral frontal lobe infarcts. Remote lacunar infarct left cerebellum. Vascular: No hyperdense vessel. Intracranial arterial calcification. Skull: No fracture or focal lesion. Sinuses/Orbits: No acute finding. Other: None. CT  CERVICAL SPINE FINDINGS Alignment: No evidence of traumatic malalignment. Loss of lordosis is likely chronic/positional. Skull base and vertebrae: No acute fracture. No primary bone lesion or focal pathologic process. Soft tissues and spinal canal: No prevertebral fluid or swelling. No visible canal hematoma. Disc levels: Multilevel spondylosis, disc space height loss, and degenerative endplate changes greatest at C5-C6 and C6-C7 where it is advanced. Multilevel facet arthropathy most advanced on the right at C2-C3. Spinal canal narrowing is greatest at C6-C7 secondary to posterior disc osteophyte complex where it is moderate. Upper chest: No acute abnormality. Other: Carotid calcification. IMPRESSION: 1. No acute intracranial abnormality. Generalized atrophy, small vessel white matter disease, and chronic infarcts. 2. No acute fracture in the cervical spine. Multilevel degenerative spondylosis. Electronically Signed   By: Minerva Fester M.D.   On: 12/19/2022 20:41   DG Chest Port 1 View  Result Date: 12/19/2022 CLINICAL DATA:  Questionable sepsis - evaluate for abnormality EXAM: PORTABLE CHEST 1 VIEW COMPARISON:  07/15/2020 FINDINGS: Left loop recorder device in place, unchanged. Heart and mediastinal contours are within normal limits. No focal opacities or effusions. No acute bony abnormality. IMPRESSION: No active cardiopulmonary disease. Electronically Signed   By: Charlett Nose M.D.   On: 12/19/2022 20:28    Microbiology: Results for orders placed or performed during the hospital encounter of 12/21/22  Blood culture (routine x 2)     Status: None (Preliminary result)   Collection Time: 12/21/22  7:53 PM   Specimen: BLOOD  Result Value Ref Range Status   Specimen Description BLOOD BLOOD RIGHT ARM  Final   Special Requests   Final    BOTTLES DRAWN AEROBIC AND ANAEROBIC Blood Culture adequate volume   Culture   Final    NO GROWTH 2 DAYS Performed at [facility], [address],  [place], [place] [address]    Report Status PENDING  Incomplete  SARS Coronavirus 2 by RT PCR (hospital order, performed in [facility] hospital lab) *cepheid single result test* Anterior Nasal Swab     Status: None   Collection Time: 12/21/22  7:53 PM   Specimen: Anterior Nasal Swab  Result Value Ref Range Status   SARS Coronavirus 2 by RT PCR NEGATIVE NEGATIVE Final    Comment: (NOTE) SARS-CoV-2 target nucleic acids are NOT DETECTED.  The SARS-CoV-2 RNA is generally detectable in upper and lower respiratory specimens during the acute phase of infection. The lowest concentration of SARS-CoV-2 viral copies this assay can detect is 250 copies / mL. A negative result does not preclude SARS-CoV-2 infection and should not be used as the sole basis for treatment or other patient management decisions.  A negative result may occur with improper specimen collection / handling, submission of specimen other than nasopharyngeal swab, presence of viral mutation(s) within the areas targeted by this assay, and inadequate number of viral copies (<250 copies / mL). A negative result must be combined with clinical observations, patient history, and epidemiological information.  Fact Sheet for Patients:   RoadLapTop.co.za  Fact Sheet for Healthcare Providers: http://kim-miller.com/  This test is not yet approved or  cleared by the [place] FDA and has been authorized for detection and/or diagnosis of SARS-CoV-2 by FDA under an Emergency Use Authorization (EUA).  This EUA will remain in effect (meaning this test can be used) for the duration of the COVID-19 declaration under Section 564(b)(1) of the Act, 21 U.S.C. section 360bbb-3(b)(1), unless the authorization is terminated or revoked sooner.  Performed at [facility], [address] [place], [place] [address]   Urine Culture     Status: None   Collection Time: 12/21/22  9:01 PM    Specimen: Urine, Clean Catch  Result Value Ref Range Status   Specimen Description   Final    URINE, CLEAN CATCH Performed at [facility], [address] [place], [place] [address]    Special Requests   Final    NONE Performed at [facility], [address], [place], [place] [address]    Culture   Final    NO GROWTH Performed at [facility], [address] [place], [place] [address]    Report Status 12/23/2022 FINAL  Final  Culture, blood (Routine X 2) w Reflex to ID Panel     Status: None (Preliminary result)   Collection Time: 12/22/22  1:38 AM   Specimen: BLOOD  Result Value Ref Range Status   Specimen Description BLOOD BLOOD RIGHT ARM  Final   Special Requests   Final    BOTTLES DRAWN AEROBIC AND ANAEROBIC Blood Culture results may not be optimal due to an inadequate volume of blood received in culture bottles   Culture   Final    NO GROWTH 1 DAY Performed at [facility], [address] [place], [place] [address]    Report Status PENDING  Incomplete    Labs: CBC: Recent Labs  Lab 12/19/22 1833 12/20/22 0517 12/21/22 1924 12/22/22 0356  WBC 12.6* 15.5* 11.2* 10.7*  NEUTROABS 11.1*  --  9.2*  --   HGB 10.8* 9.9* 9.5* 9.2*  HCT 36.7 32.9* 30.9* 30.0*  MCV 87.2 85.5 85.4 84.5  PLT 246 212 240 201   Basic Metabolic Panel: Recent Labs  Lab 12/19/22 1833 12/20/22 0517 12/21/22 1924 12/22/22 0356  NA 139 136 135 138  K 4.0 3.6 3.4* 3.6  CL 101  102 98 105  CO2 16* 22 23 25   GLUCOSE 120* 98 162* 143*  BUN 17 17 18 15   CREATININE 1.10* 0.94 1.20* 0.94  CALCIUM 10.0 9.8 10.1 9.5   Liver Function Tests: Recent Labs  Lab 12/19/22 1833 12/21/22 1924  AST 38 51*  ALT 12 16  ALKPHOS 85 83  BILITOT 1.3* 0.6  PROT 7.4 6.7  ALBUMIN 2.8* 2.6*   CBG: Recent Labs  Lab 12/21/22 0809 12/22/22 0430 12/23/22 0445  GLUCAP 95 146* 77    Discharge time spent: greater than 30 minutes.  Signed: Enedina Finner, MD [facility] 12/23/2022

## 2022-12-23 NOTE — Group Note (Deleted)

## 2022-12-23 NOTE — Discharge Instructions (Addendum)
Pt's son to call in the area and establish PCP. He has a list given during last admission  Keep log of BP at home and hold BP med if SBP <130

## 2022-12-23 NOTE — Progress Notes (Signed)
Patient refused echo this morning.  Echo tech in room and patient would not cooperate.  RN and NT attempted to speak with patient and patient continued to refuse.  Echo tech states that he will attempt to come back if possible.  MD made aware.

## 2022-12-23 NOTE — Progress Notes (Signed)
PT Cancellation Note  Patient Details Name: Kaylee [name] MRN: [id number] DOB: Jan 22, 1941   Cancelled Treatment:    Reason Eval/Treat Not Completed: Patient declined to participate with PT services despite education and encouragement.  Refused mobility as well as therex stating "I told you I'm not doing anything today".  Will attempt to see pt at a future date/time as medically appropriate.    Ovidio Hanger PT, DPT 12/23/22, 10:28 AM

## 2022-12-23 NOTE — Evaluation (Signed)
Speech Language Pathology Evaluation Patient Details Name: Kaylee [name] MRN: [id number] DOB: [birth year] Today's Date: 12/23/2022 Time: 6295-2841 SLP Time Calculation (min) (ACUTE ONLY): 10 min  Problem List:  Patient Active Problem List   Diagnosis Date Noted   Acute CVA (cerebrovascular accident) (HCC) 12/22/2022   Hypokalemia 12/21/2022   Sepsis (HCC) 12/20/2022   Sepsis due to undetermined organism (HCC) 12/19/2022   Metabolic acidosis 12/19/2022   History of CVA (cerebrovascular accident) 12/19/2022   Coronary artery disease 12/19/2022   Failure to thrive in adult 12/19/2022   Fall at home, initial encounter 12/19/2022   Weakness 12/19/2022   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) 08/15/2020   Syncope 07/15/2020   NSTEMI (non-ST elevated myocardial infarction) (HCC) 07/15/2020   Essential hypertension 02/10/2018   Hypothyroidism 02/10/2018   Hyperlipidemia 02/10/2018   CVA (cerebral vascular accident) (HCC) 02/05/2018   Past Medical History:  Past Medical History:  Diagnosis Date   Depression    Fainting spell    Frequent headaches    Hyperlipidemia    Hypertension    Kidney lesion, native, right    Migraine    Stroke (HCC)    Thyroid disease    UTI (urinary tract infection)    Past Surgical History:  Past Surgical History:  Procedure Laterality Date   LOOP RECORDER INSERTION N/A 02/24/2018   Procedure: LOOP RECORDER INSERTION;  Surgeon: Duke Salvia, MD;  Location: [facility] INVASIVE CV LAB;  Service: Cardiovascular;  Laterality: N/A;   TEE WITHOUT CARDIOVERSION N/A 02/24/2018   Procedure: TRANSESOPHAGEAL ECHOCARDIOGRAM (TEE);  Surgeon: Antonieta Iba, MD;  Location: [facility] ORS;  Service: Cardiovascular;  Laterality: N/A;   HPI:  Pt is an 82 y/o F admitted on 12/21/22 after presenting with c/o acute onset of AMS & unresponsiveness after being d/c earlier this afternoon. Per MD, MRI "shows acute left frontal infarct". Of note, pt with recent admission  on 9/5 for management of severe sepsis, FTT, & metabolic acidosis. PMH: HTN, dyslipidemia, hypothyroidism, UTI, depression, migraine, CVA 2022, dementia, breast CA, MI   Assessment / Plan / Recommendation Clinical Impression  Pt seen for cognitive communication evaluation. Assessment completed via informal means. Pt presents with cognitive communicatoin deficits affecting attention, memory, orientation, problem solving, insight, reasoning, auditory comprehension, verbal expression, and pragmatic language. Pt alert, flat affect. Limited spontaneous verbal communication. Speech is clear and without overt anomia or dysarthria. Pt follows 1 step commands with extra time, inconsitent ability to follow 2 step commands and answer basic yes/no questions. Pt oriented to self only. Noted pt with hx of stroke and dementia. ?pt's baseline level of functioning. No family present to provide collateral information at this time. Recommend HH SLP for further cognitive communication evaluation in home/functional environement. SLP to sign off at this level of care.    SLP Assessment  SLP Recommendation/Assessment: All further Speech Lanaguage Pathology  needs can be addressed in the next venue of care SLP Visit Diagnosis: Frontal lobe and executive function deficit;Cognitive communication deficit (R41.841) Frontal lobe and executive function deficit following: Cerebral infarction    Recommendations for follow up therapy are one component of a multi-disciplinary discharge planning process, led by the attending physician.  Recommendations may be updated based on patient status, additional functional criteria and insurance authorization.    Follow Up Recommendations  Home health SLP    Assistance Recommended at Discharge  Frequent or constant Supervision/Assistance  Functional Status Assessment Patient has had a recent decline in their functional status and demonstrates  the ability to make significant improvements in  function in a reasonable and predictable amount of time.        SLP Evaluation Cognition  Overall Cognitive Status: No family/caregiver present to determine baseline cognitive functioning Arousal/Alertness: Awake/alert Orientation Level: Disoriented to time;Disoriented to situation;Oriented to place Memory: Impaired Memory Impairment: Storage deficit;Retrieval deficit;Decreased recall of new information;Decreased short term memory Awareness: Impaired Awareness Impairment: Intellectual impairment Problem Solving: Impaired Problem Solving Impairment: Verbal basic;Functional basic Executive Function: Sequencing Sequencing: Impaired Sequencing Impairment: Verbal basic Behaviors: Verbal agitation       Comprehension  Auditory Comprehension Overall Auditory Comprehension: Impaired Yes/No Questions: Impaired Basic Biographical Questions: 51-75% accurate Commands: Impaired One Step Basic Commands: 75-100% accurate (extra time) Two Step Basic Commands: 50-74% accurate Interfering Components: Attention;Working Radio broadcast assistant: Water engineer cues Reading Comprehension Reading Status: Not tested    Expression Expression Primary Mode of Expression: Verbal Verbal Expression Overall Verbal Expression: Impaired Initiation: Impaired Automatic Speech: Name [facility]) Naming:  (5/5 objects in room) Pragmatics: Impairment Impairments: Eye contact;Abnormal affect;Topic maintenance Interfering Components: Attention   Oral / Motor  Oral Motor/Sensory Function Overall Oral Motor/Sensory Function: Within functional limits Motor Speech Overall Motor Speech: Appears within functional limits for tasks assessed Respiration: Within functional limits Phonation: Normal Resonance: Within functional limits Articulation: Within functional limitis Intelligibility: Intelligible Motor Planning: Witnin functional limits           Clyde Canterbury, M.S.,  CCC-SLP Speech-Language Pathologist [facility] [phone] (ASCOM)  Woodroe Chen 12/23/2022, 8:28 AM

## 2022-12-23 NOTE — Evaluation (Signed)
Clinical/Bedside Swallow Evaluation Patient Details  Name: Kaylee [name] MRN: [id number] Date of Birth: 1940/06/24  Today's Date: 12/23/2022 Time: SLP Start Time (ACUTE ONLY): 0845 SLP Stop Time (ACUTE ONLY): 0855 SLP Time Calculation (min) (ACUTE ONLY): 10 min  Past Medical History:  Past Medical History:  Diagnosis Date   Depression    Fainting spell    Frequent headaches    Hyperlipidemia    Hypertension    Kidney lesion, native, right    Migraine    Stroke [facility])    Thyroid disease    UTI (urinary tract infection)    Past Surgical History:  Past Surgical History:  Procedure Laterality Date   LOOP RECORDER INSERTION N/A 02/24/2018   Procedure: LOOP RECORDER INSERTION;  Surgeon: Duke Salvia, MD;  Location: [facility] INVASIVE CV LAB;  Service: Cardiovascular;  Laterality: N/A;   TEE WITHOUT CARDIOVERSION N/A 02/24/2018   Procedure: TRANSESOPHAGEAL ECHOCARDIOGRAM (TEE);  Surgeon: Antonieta Iba, MD;  Location: [facility] ORS;  Service: Cardiovascular;  Laterality: N/A;   HPI:  Pt is an 82 y/o F admitted on 12/21/22 after presenting with c/o acute onset of AMS & unresponsiveness after being d/c earlier this afternoon. Per MD, MRI "shows acute left frontal infarct". Of note, pt with recent admission on 9/5 for management of severe sepsis, FTT, & metabolic acidosis. PMH: HTN, dyslipidemia, hypothyroidism, UTI, depression, migraine, CVA 2022, dementia, breast CA, MI    Assessment / Plan / Recommendation  Clinical Impression  Pt seen for clinical swallowing evaluation. Pt demonstrated an intact oral swallow. Pharyngeal swallow appeared [facility] per clinical assessment. Recommend continuation of current diet with standard aspiration precautions. Cognitive linguistic evaluation to follow.      Aspiration Risk  Mild aspiration risk    Diet Recommendation Regular;Thin liquid    Liquid Administration via: Spoon;Cup;Straw Medication Administration:  (as tolerated) Supervision: Patient able to  self feed (set up) Compensations: Minimize environmental distractions;Slow rate;Lingual sweep for clearance of pocketing Postural Changes: Seated upright at 90 degrees;Remain upright for at least 30 minutes after po intake    Other  Recommendations Oral Care Recommendations: Oral care BID;Staff/trained caregiver to provide oral care    Recommendations for follow up therapy are one component of a multi-disciplinary discharge planning process, led by the attending physician.  Recommendations may be updated based on patient status, additional functional criteria and insurance authorization.  Follow up Recommendations Home health SLP (for cognitive-communication deficits)      Assistance Recommended at Discharge    Functional Status Assessment Patient has had a recent decline in their functional status and demonstrates the ability to make significant improvements in function in a reasonable and predictable amount of time. (no f/u for swallowing; cognitive communication evaluation in home setting at d/c)  Frequency and Duration            Prognosis Prognosis for improved oropharyngeal function: Fair (for cognitive communication deficits) Barriers to Reach Goals: Cognitive deficits      Swallow Study   General Date of Onset: 12/21/22 HPI: Pt is an 82 y/o F admitted on 12/21/22 after presenting with c/o acute onset of AMS & unresponsiveness after being d/c earlier this afternoon. Per MD, MRI "shows acute left frontal infarct". Of note, pt with recent admission on 9/5 for management of severe sepsis, FTT, & metabolic acidosis. PMH: HTN, dyslipidemia, hypothyroidism, UTI, depression, migraine, CVA 2022, dementia, breast CA, MI Type of Study: Bedside Swallow Evaluation Previous Swallow Assessment: regular diet, thin liquids Diet Prior to this Study: Regular;Thin  liquids (Level 0) Temperature Spikes Noted: No Respiratory Status: Room air History of Recent Intubation: No Behavior/Cognition:  Alert;Distractible;Requires cueing Oral Cavity Assessment: Within Functional Limits Oral Care Completed by SLP: Yes Oral Cavity - Dentition: Adequate natural dentition Vision: Functional for self-feeding Self-Feeding Abilities: Able to feed self Patient Positioning: Upright in bed Baseline Vocal Quality: Normal Volitional Cough: Strong Volitional Swallow: Able to elicit    Oral/Motor/Sensory Function Overall Oral Motor/Sensory Function: Within functional limits   Ice Chips Ice chips: Not tested   Thin Liquid Thin Liquid: Within functional limits Presentation: Self Fed;Cup;Straw    Nectar Thick Nectar Thick Liquid: Not tested   Honey Thick Honey Thick Liquid: Not tested   Puree Puree: Within functional limits Presentation: Self Fed   Solid     Solid: Within functional limits Presentation: Self Fed     Clyde Canterbury, M.S., CCC-SLP Speech-Language Pathologist [facility] [phone] (ASCOM)  Woodroe Chen 12/23/2022,8:21 AM

## 2022-12-24 DIAGNOSIS — I10 Essential (primary) hypertension: Secondary | ICD-10-CM | POA: Diagnosis not present

## 2022-12-24 DIAGNOSIS — N390 Urinary tract infection, site not specified: Secondary | ICD-10-CM | POA: Diagnosis not present

## 2022-12-24 DIAGNOSIS — E039 Hypothyroidism, unspecified: Secondary | ICD-10-CM | POA: Diagnosis not present

## 2022-12-24 DIAGNOSIS — F32A Depression, unspecified: Secondary | ICD-10-CM | POA: Diagnosis not present

## 2022-12-24 DIAGNOSIS — Z792 Long term (current) use of antibiotics: Secondary | ICD-10-CM | POA: Diagnosis not present

## 2022-12-24 DIAGNOSIS — Z9181 History of falling: Secondary | ICD-10-CM | POA: Diagnosis not present

## 2022-12-24 DIAGNOSIS — E785 Hyperlipidemia, unspecified: Secondary | ICD-10-CM | POA: Diagnosis not present

## 2022-12-24 DIAGNOSIS — I700 Atherosclerosis of aorta: Secondary | ICD-10-CM | POA: Diagnosis not present

## 2022-12-24 DIAGNOSIS — Z853 Personal history of malignant neoplasm of breast: Secondary | ICD-10-CM | POA: Diagnosis not present

## 2022-12-24 DIAGNOSIS — I2510 Atherosclerotic heart disease of native coronary artery without angina pectoris: Secondary | ICD-10-CM | POA: Diagnosis not present

## 2022-12-24 DIAGNOSIS — R627 Adult failure to thrive: Secondary | ICD-10-CM | POA: Diagnosis not present

## 2022-12-24 DIAGNOSIS — Z8673 Personal history of transient ischemic attack (TIA), and cerebral infarction without residual deficits: Secondary | ICD-10-CM | POA: Diagnosis not present

## 2022-12-24 DIAGNOSIS — R911 Solitary pulmonary nodule: Secondary | ICD-10-CM | POA: Diagnosis not present

## 2022-12-24 LAB — CULTURE, BLOOD (ROUTINE X 2)
Culture: NO GROWTH
Culture: NO GROWTH
Special Requests: ADEQUATE

## 2022-12-26 DIAGNOSIS — F32A Depression, unspecified: Secondary | ICD-10-CM | POA: Diagnosis not present

## 2022-12-26 DIAGNOSIS — N390 Urinary tract infection, site not specified: Secondary | ICD-10-CM | POA: Diagnosis not present

## 2022-12-26 DIAGNOSIS — I10 Essential (primary) hypertension: Secondary | ICD-10-CM | POA: Diagnosis not present

## 2022-12-26 DIAGNOSIS — I2510 Atherosclerotic heart disease of native coronary artery without angina pectoris: Secondary | ICD-10-CM | POA: Diagnosis not present

## 2022-12-26 DIAGNOSIS — E039 Hypothyroidism, unspecified: Secondary | ICD-10-CM | POA: Diagnosis not present

## 2022-12-26 DIAGNOSIS — I700 Atherosclerosis of aorta: Secondary | ICD-10-CM | POA: Diagnosis not present

## 2022-12-26 LAB — CULTURE, BLOOD (ROUTINE X 2)
Culture: NO GROWTH
Special Requests: ADEQUATE

## 2022-12-27 DIAGNOSIS — F32A Depression, unspecified: Secondary | ICD-10-CM | POA: Diagnosis not present

## 2022-12-27 DIAGNOSIS — N390 Urinary tract infection, site not specified: Secondary | ICD-10-CM | POA: Diagnosis not present

## 2022-12-27 DIAGNOSIS — E039 Hypothyroidism, unspecified: Secondary | ICD-10-CM | POA: Diagnosis not present

## 2022-12-27 DIAGNOSIS — I2510 Atherosclerotic heart disease of native coronary artery without angina pectoris: Secondary | ICD-10-CM | POA: Diagnosis not present

## 2022-12-27 DIAGNOSIS — I700 Atherosclerosis of aorta: Secondary | ICD-10-CM | POA: Diagnosis not present

## 2022-12-27 DIAGNOSIS — I10 Essential (primary) hypertension: Secondary | ICD-10-CM | POA: Diagnosis not present

## 2022-12-27 LAB — CULTURE, BLOOD (ROUTINE X 2): Culture: NO GROWTH

## 2022-12-30 DIAGNOSIS — F32A Depression, unspecified: Secondary | ICD-10-CM | POA: Diagnosis not present

## 2022-12-30 DIAGNOSIS — I10 Essential (primary) hypertension: Secondary | ICD-10-CM | POA: Diagnosis not present

## 2022-12-30 DIAGNOSIS — I2510 Atherosclerotic heart disease of native coronary artery without angina pectoris: Secondary | ICD-10-CM | POA: Diagnosis not present

## 2022-12-30 DIAGNOSIS — N390 Urinary tract infection, site not specified: Secondary | ICD-10-CM | POA: Diagnosis not present

## 2022-12-30 DIAGNOSIS — I700 Atherosclerosis of aorta: Secondary | ICD-10-CM | POA: Diagnosis not present

## 2022-12-30 DIAGNOSIS — E039 Hypothyroidism, unspecified: Secondary | ICD-10-CM | POA: Diagnosis not present

## 2022-12-31 DIAGNOSIS — N390 Urinary tract infection, site not specified: Secondary | ICD-10-CM | POA: Diagnosis not present

## 2022-12-31 DIAGNOSIS — I700 Atherosclerosis of aorta: Secondary | ICD-10-CM | POA: Diagnosis not present

## 2022-12-31 DIAGNOSIS — I10 Essential (primary) hypertension: Secondary | ICD-10-CM | POA: Diagnosis not present

## 2022-12-31 DIAGNOSIS — E039 Hypothyroidism, unspecified: Secondary | ICD-10-CM | POA: Diagnosis not present

## 2022-12-31 DIAGNOSIS — I2510 Atherosclerotic heart disease of native coronary artery without angina pectoris: Secondary | ICD-10-CM | POA: Diagnosis not present

## 2022-12-31 DIAGNOSIS — F32A Depression, unspecified: Secondary | ICD-10-CM | POA: Diagnosis not present

## 2023-01-01 DIAGNOSIS — I2510 Atherosclerotic heart disease of native coronary artery without angina pectoris: Secondary | ICD-10-CM | POA: Diagnosis not present

## 2023-01-01 DIAGNOSIS — I700 Atherosclerosis of aorta: Secondary | ICD-10-CM | POA: Diagnosis not present

## 2023-01-01 DIAGNOSIS — E039 Hypothyroidism, unspecified: Secondary | ICD-10-CM | POA: Diagnosis not present

## 2023-01-01 DIAGNOSIS — I10 Essential (primary) hypertension: Secondary | ICD-10-CM | POA: Diagnosis not present

## 2023-01-01 DIAGNOSIS — F32A Depression, unspecified: Secondary | ICD-10-CM | POA: Diagnosis not present

## 2023-01-01 DIAGNOSIS — N390 Urinary tract infection, site not specified: Secondary | ICD-10-CM | POA: Diagnosis not present

## 2023-01-02 DIAGNOSIS — N390 Urinary tract infection, site not specified: Secondary | ICD-10-CM | POA: Diagnosis not present

## 2023-01-02 DIAGNOSIS — I10 Essential (primary) hypertension: Secondary | ICD-10-CM | POA: Diagnosis not present

## 2023-01-02 DIAGNOSIS — I700 Atherosclerosis of aorta: Secondary | ICD-10-CM | POA: Diagnosis not present

## 2023-01-02 DIAGNOSIS — E039 Hypothyroidism, unspecified: Secondary | ICD-10-CM | POA: Diagnosis not present

## 2023-01-02 DIAGNOSIS — F32A Depression, unspecified: Secondary | ICD-10-CM | POA: Diagnosis not present

## 2023-01-02 DIAGNOSIS — I2510 Atherosclerotic heart disease of native coronary artery without angina pectoris: Secondary | ICD-10-CM | POA: Diagnosis not present

## 2023-01-07 DIAGNOSIS — F32A Depression, unspecified: Secondary | ICD-10-CM | POA: Diagnosis not present

## 2023-01-07 DIAGNOSIS — I2510 Atherosclerotic heart disease of native coronary artery without angina pectoris: Secondary | ICD-10-CM | POA: Diagnosis not present

## 2023-01-07 DIAGNOSIS — I10 Essential (primary) hypertension: Secondary | ICD-10-CM | POA: Diagnosis not present

## 2023-01-07 DIAGNOSIS — I700 Atherosclerosis of aorta: Secondary | ICD-10-CM | POA: Diagnosis not present

## 2023-01-07 DIAGNOSIS — N390 Urinary tract infection, site not specified: Secondary | ICD-10-CM | POA: Diagnosis not present

## 2023-01-07 DIAGNOSIS — E039 Hypothyroidism, unspecified: Secondary | ICD-10-CM | POA: Diagnosis not present

## 2023-01-09 DIAGNOSIS — N390 Urinary tract infection, site not specified: Secondary | ICD-10-CM | POA: Diagnosis not present

## 2023-01-09 DIAGNOSIS — I2510 Atherosclerotic heart disease of native coronary artery without angina pectoris: Secondary | ICD-10-CM | POA: Diagnosis not present

## 2023-01-09 DIAGNOSIS — E039 Hypothyroidism, unspecified: Secondary | ICD-10-CM | POA: Diagnosis not present

## 2023-01-09 DIAGNOSIS — I10 Essential (primary) hypertension: Secondary | ICD-10-CM | POA: Diagnosis not present

## 2023-01-09 DIAGNOSIS — I700 Atherosclerosis of aorta: Secondary | ICD-10-CM | POA: Diagnosis not present

## 2023-01-09 DIAGNOSIS — F32A Depression, unspecified: Secondary | ICD-10-CM | POA: Diagnosis not present

## 2023-01-12 ENCOUNTER — Emergency Department: Payer: Medicare Other

## 2023-01-12 ENCOUNTER — Encounter: Payer: Self-pay | Admitting: Family Medicine

## 2023-01-12 ENCOUNTER — Other Ambulatory Visit: Payer: Self-pay

## 2023-01-12 ENCOUNTER — Inpatient Hospital Stay
Admission: EM | Admit: 2023-01-12 | Discharge: 2023-01-15 | DRG: 064 | Disposition: A | Discharge status: Hospice | Payer: Medicare Other | Attending: Osteopathic Medicine | Admitting: Osteopathic Medicine

## 2023-01-12 DIAGNOSIS — Z515 Encounter for palliative care: Secondary | ICD-10-CM | POA: Diagnosis not present

## 2023-01-12 DIAGNOSIS — I639 Cerebral infarction, unspecified: Secondary | ICD-10-CM | POA: Diagnosis not present

## 2023-01-12 DIAGNOSIS — G9341 Metabolic encephalopathy: Secondary | ICD-10-CM | POA: Diagnosis present

## 2023-01-12 DIAGNOSIS — Z7401 Bed confinement status: Secondary | ICD-10-CM | POA: Diagnosis not present

## 2023-01-12 DIAGNOSIS — C50911 Malignant neoplasm of unspecified site of right female breast: Secondary | ICD-10-CM | POA: Diagnosis not present

## 2023-01-12 DIAGNOSIS — Z66 Do not resuscitate: Secondary | ICD-10-CM | POA: Diagnosis not present

## 2023-01-12 DIAGNOSIS — Z1152 Encounter for screening for COVID-19: Secondary | ICD-10-CM | POA: Diagnosis not present

## 2023-01-12 DIAGNOSIS — C792 Secondary malignant neoplasm of skin: Secondary | ICD-10-CM | POA: Diagnosis not present

## 2023-01-12 DIAGNOSIS — G43909 Migraine, unspecified, not intractable, without status migrainosus: Secondary | ICD-10-CM | POA: Diagnosis not present

## 2023-01-12 DIAGNOSIS — R636 Underweight: Secondary | ICD-10-CM | POA: Diagnosis present

## 2023-01-12 DIAGNOSIS — Z803 Family history of malignant neoplasm of breast: Secondary | ICD-10-CM

## 2023-01-12 DIAGNOSIS — G9340 Encephalopathy, unspecified: Secondary | ICD-10-CM | POA: Insufficient documentation

## 2023-01-12 DIAGNOSIS — I69354 Hemiplegia and hemiparesis following cerebral infarction affecting left non-dominant side: Secondary | ICD-10-CM | POA: Diagnosis not present

## 2023-01-12 DIAGNOSIS — R4182 Altered mental status, unspecified: Secondary | ICD-10-CM | POA: Diagnosis not present

## 2023-01-12 DIAGNOSIS — I63522 Cerebral infarction due to unspecified occlusion or stenosis of left anterior cerebral artery: Principal | ICD-10-CM | POA: Diagnosis present

## 2023-01-12 DIAGNOSIS — R5381 Other malaise: Secondary | ICD-10-CM | POA: Diagnosis present

## 2023-01-12 DIAGNOSIS — Z87440 Personal history of urinary (tract) infections: Secondary | ICD-10-CM | POA: Diagnosis not present

## 2023-01-12 DIAGNOSIS — Z79899 Other long term (current) drug therapy: Secondary | ICD-10-CM

## 2023-01-12 DIAGNOSIS — A419 Sepsis, unspecified organism: Secondary | ICD-10-CM | POA: Diagnosis not present

## 2023-01-12 DIAGNOSIS — C50919 Malignant neoplasm of unspecified site of unspecified female breast: Secondary | ICD-10-CM

## 2023-01-12 DIAGNOSIS — Z7982 Long term (current) use of aspirin: Secondary | ICD-10-CM | POA: Diagnosis not present

## 2023-01-12 DIAGNOSIS — Z8673 Personal history of transient ischemic attack (TIA), and cerebral infarction without residual deficits: Secondary | ICD-10-CM

## 2023-01-12 DIAGNOSIS — D4100 Neoplasm of uncertain behavior of unspecified kidney: Secondary | ICD-10-CM | POA: Diagnosis not present

## 2023-01-12 DIAGNOSIS — Z6823 Body mass index (BMI) 23.0-23.9, adult: Secondary | ICD-10-CM | POA: Diagnosis not present

## 2023-01-12 DIAGNOSIS — R404 Transient alteration of awareness: Secondary | ICD-10-CM | POA: Diagnosis not present

## 2023-01-12 DIAGNOSIS — F32A Depression, unspecified: Secondary | ICD-10-CM | POA: Diagnosis not present

## 2023-01-12 DIAGNOSIS — R64 Cachexia: Secondary | ICD-10-CM | POA: Diagnosis present

## 2023-01-12 DIAGNOSIS — Z853 Personal history of malignant neoplasm of breast: Secondary | ICD-10-CM | POA: Diagnosis not present

## 2023-01-12 DIAGNOSIS — I63412 Cerebral infarction due to embolism of left middle cerebral artery: Secondary | ICD-10-CM | POA: Diagnosis not present

## 2023-01-12 DIAGNOSIS — R0902 Hypoxemia: Secondary | ICD-10-CM | POA: Diagnosis not present

## 2023-01-12 DIAGNOSIS — Z8249 Family history of ischemic heart disease and other diseases of the circulatory system: Secondary | ICD-10-CM

## 2023-01-12 DIAGNOSIS — I10 Essential (primary) hypertension: Secondary | ICD-10-CM | POA: Diagnosis present

## 2023-01-12 DIAGNOSIS — G9389 Other specified disorders of brain: Secondary | ICD-10-CM | POA: Diagnosis not present

## 2023-01-12 DIAGNOSIS — E785 Hyperlipidemia, unspecified: Secondary | ICD-10-CM | POA: Diagnosis present

## 2023-01-12 DIAGNOSIS — R531 Weakness: Secondary | ICD-10-CM | POA: Diagnosis not present

## 2023-01-12 DIAGNOSIS — N390 Urinary tract infection, site not specified: Secondary | ICD-10-CM | POA: Diagnosis not present

## 2023-01-12 DIAGNOSIS — I679 Cerebrovascular disease, unspecified: Secondary | ICD-10-CM | POA: Diagnosis not present

## 2023-01-12 DIAGNOSIS — R0689 Other abnormalities of breathing: Secondary | ICD-10-CM | POA: Diagnosis not present

## 2023-01-12 LAB — COMPREHENSIVE METABOLIC PANEL
ALT: 36 U/L (ref 0–44)
ALT: 31 U/L (ref 0–44)
AST: 79 U/L — ABNORMAL HIGH (ref 15–41)
AST: 63 U/L — ABNORMAL HIGH (ref 15–41)
Albumin: 2.3 g/dL — ABNORMAL LOW (ref 3.5–5.0)
Albumin: 2 g/dL — ABNORMAL LOW (ref 3.5–5.0)
Alkaline Phosphatase: 153 U/L — ABNORMAL HIGH (ref 38–126)
Alkaline Phosphatase: 128 U/L — ABNORMAL HIGH (ref 38–126)
Anion gap: 12 (ref 5–15)
Anion gap: 8 (ref 5–15)
BUN: 25 mg/dL — ABNORMAL HIGH (ref 8–23)
BUN: 19 mg/dL (ref 8–23)
CO2: 26 mmol/L (ref 22–32)
CO2: 27 mmol/L (ref 22–32)
Calcium: 13.6 mg/dL (ref 8.9–10.3)
Calcium: 11.8 mg/dL — ABNORMAL HIGH (ref 8.9–10.3)
Chloride: 97 mmol/L — ABNORMAL LOW (ref 98–111)
Chloride: 103 mmol/L (ref 98–111)
Creatinine, Ser: 0.59 mg/dL (ref 0.44–1.00)
Creatinine, Ser: 0.55 mg/dL (ref 0.44–1.00)
GFR, Estimated: >60 mL/min (ref >60)
GFR, Estimated: >60 mL/min (ref >60)
Glucose, Bld: 94 mg/dL (ref 70–99)
Glucose, Bld: 109 mg/dL — ABNORMAL HIGH (ref 70–99)
Potassium: 3.5 mmol/L (ref 3.5–5.1)
Potassium: 3 mmol/L — ABNORMAL LOW (ref 3.5–5.1)
Sodium: 135 mmol/L (ref 135–145)
Sodium: 138 mmol/L (ref 135–145)
Total Bilirubin: 1 mg/dL (ref 0.3–1.2)
Total Bilirubin: 0.9 mg/dL (ref 0.3–1.2)
Total Protein: 8.5 g/dL — ABNORMAL HIGH (ref 6.5–8.1)
Total Protein: 7.5 g/dL (ref 6.5–8.1)

## 2023-01-12 LAB — CBC
HCT: 35.3 % — ABNORMAL LOW (ref 36.0–46.0)
Hemoglobin: 10.4 g/dL — ABNORMAL LOW (ref 12.0–15.0)
MCH: 25.3 pg — ABNORMAL LOW (ref 26.0–34.0)
MCHC: 29.5 g/dL — ABNORMAL LOW (ref 30.0–36.0)
MCV: 85.9 fL (ref 80.0–100.0)
Platelets: 351 10*3/uL (ref 150–400)
RBC: 4.11 MIL/uL (ref 3.87–5.11)
RDW: 16.1 % — ABNORMAL HIGH (ref 11.5–15.5)
WBC: 11 10*3/uL — ABNORMAL HIGH (ref 4.0–10.5)
nRBC: 0.2 % (ref 0.0–0.2)

## 2023-01-12 LAB — SARS CORONAVIRUS 2 BY RT PCR: SARS Coronavirus 2 by RT PCR: NEGATIVE

## 2023-01-12 LAB — TROPONIN I (HIGH SENSITIVITY): Troponin I (High Sensitivity): 4 ng/L (ref <18)

## 2023-01-12 MED ORDER — CALCITONIN (SALMON) 200 UNIT/ML IJ SOLN
4.0000 [IU]/kg | Freq: Two times a day (BID) | INTRAMUSCULAR | Status: DC
  Administered 2023-01-12 – 2023-01-13 (×3): 232 [IU] via INTRAMUSCULAR
  Filled 2023-01-12 (×3): qty 1.16

## 2023-01-12 MED ORDER — ENOXAPARIN SODIUM 40 MG/0.4ML IJ SOSY
40.0000 mg | PREFILLED_SYRINGE | INTRAMUSCULAR | Status: DC
  Administered 2023-01-12: 40 mg via SUBCUTANEOUS
  Filled 2023-01-12: qty 0.4

## 2023-01-12 MED ORDER — SODIUM CHLORIDE 0.9 % IV BOLUS
1000.0000 mL | Freq: Once | INTRAVENOUS | Status: AC
  Administered 2023-01-12: 1000 mL via INTRAVENOUS

## 2023-01-12 MED ORDER — OLANZAPINE 10 MG IM SOLR: 5.0000 mg | Freq: Once | INTRAMUSCULAR | Status: DC | PRN

## 2023-01-12 MED ORDER — SODIUM CHLORIDE 0.9 % IV SOLN: INTRAVENOUS | Status: DC

## 2023-01-12 NOTE — Progress Notes (Signed)
Pt unable to complete admission profile at this time. Confusion AMS.

## 2023-01-12 NOTE — ED Provider Notes (Signed)
[facility] Provider Note    Event Date/Time   First MD Initiated Contact with Patient 01/12/23 [phone]     (approximate)  History   Chief Complaint: Altered Mental Status  HPI  Kaylee [name] is a 82 y.o. female with a past medical history of depression, hypertension, hyperlipidemia, CVA, presents to the emergency department for altered mental status.  According to EMS report as per family patient lives at home with her sister and has been acting more confused recently.  Patient has a history of a prior CVA with left-sided weakness at baseline has breast cancer and is not undergoing any treatments currently.  Sister had noted that the patient stated that she wanted to go home to a place that she has not lived in for at least 3 years.  They state low-grade temperature at home although EMS states patient was afebrile and the patient is afebrile in the emergency department.  Physical Exam   Triage Vital Signs: ED Triage Vitals  Encounter Vitals Group     BP 01/12/23 1003 133/82     Systolic BP Percentile --      Diastolic BP Percentile --      Pulse Rate 01/12/23 1003 87     Resp 01/12/23 1000 20     Temp 01/12/23 1000 98.5 F (36.9 C)     Temp src --      SpO2 01/12/23 1000 100 %     Weight 01/12/23 1001 128 lb (58.1 kg)     Height 01/12/23 1001 5\' 2"  (1.575 m)     Head Circumference --      Peak Flow --      Pain Score --      Pain Loc --      Pain Education --      Exclude from Growth Chart --     Most recent vital signs: Vitals:   01/12/23 1000 01/12/23 1003  BP:  133/82  Pulse:  87  Resp: 20   Temp: 98.5 F (36.9 C)   SpO2: 100%     General: Awake, no distress.  Patient does appear confused was able to tell me she is on the hospital but unable to tell me the year or why she is here.  Very soft-spoken.  Has a visible small mass to the center of her chest. CV:  Good peripheral perfusion. Regular rate and rhythm  Resp:  Normal effort.   Equal breath sounds bilaterally.  Abd:  No distention.  Soft, nontender.  No rebound or guarding.  ED Results / Procedures / Treatments   EKG  EKG viewed and interpreted by myself shows a sinus rhythm 89 bpm with a narrow QRS, normal axis, normal intervals, no concerning ST changes.  RADIOLOGY  I have reviewed and interpreted CT head images.  Patient appears to have somewhat atrophied brain but no large bleed seen on my evaluation.   MEDICATIONS ORDERED IN ED: Medications - No data to display   IMPRESSION / MDM / ASSESSMENT AND PLAN / ED COURSE  I reviewed the triage vital signs and the nursing notes.  Patient's presentation is most consistent with acute presentation with potential threat to life or bodily function.  Patient presents to the emergency department for altered mental status from home.  Per sister patient has been more confused over the last several days possible low-grade temperature.  We will check labs, urinalysis, chest x-ray and a CT scan of the head.  Differential is  quite broad but would include CVA or metastatic spread, metabolic or electrolyte abnormality, dehydration, infectious etiology such as urinary tract infection pneumonia or COVID.  Patient's labs are resulted showing a significantly elevated calcium at 13.6.  Will continue with IV fluids for the patient.  Remainder of the chemistry shows no significant finding anion gap borderline elevated at 12.  CBC overall reassuring mild anemia.  Troponin is negative COVID is negative.  Chest x-ray shows no concerning findings.  CT scan of the head shows no acute or concerning finding.  Patient's weakness could very likely be related to hypercalcemia, does not entirely clear the cause of the hypercalcemia could be related to bony metastases however given her known breast cancer history.  FINAL CLINICAL IMPRESSION(S) / ED DIAGNOSES   Altered mental status Hypercalcemia   Note:  This document was prepared using Dragon  voice recognition software and may include unintentional dictation errors.   Minna Antis, MD 01/12/23 [phone]

## 2023-01-12 NOTE — Assessment & Plan Note (Signed)
Calcium 13.6 with concomitant encephalopathy in setting of untreated breast cancer Suspect underlying metastatic disease Alk phos 153 IV fluid hydration in the ER Add on calcitonin Check PTH, ionized calcium, vitamin D levels Serial calciums overnight Monitor

## 2023-01-12 NOTE — ED Notes (Signed)
Pt cleaned up after small BM and wet brief. Purewick placed

## 2023-01-12 NOTE — Assessment & Plan Note (Signed)
Progressive worsening confusion in setting of prior history of CVA as well as metastatic breast cancer and likely undiagnosed dementia Calcium level 13.6 today which likely confounding issue for acute decompensation CT head stable Treat per hypercalcemia protocol Monitor

## 2023-01-12 NOTE — ED Triage Notes (Signed)
Pt from home via [facility] for AMS per family. Sister stated she was wanting to go to a previous home she hasn't lived in for at least 3 years. Family reported fever. Hx of stroke with left sided weakness. Has breast cancer and has refused treatment EMS vitals: CBG 99 Temp 97.3 BP 149/83 Endtidal 38 98% RA DNR with son in [place] family per EMS

## 2023-01-12 NOTE — H&P (Addendum)
History and Physical    Patient: Kaylee [name] JWJ:[id number] DOB: November 24, 1940 DOA: 01/12/2023 DOS: the patient was seen and examined on 01/12/2023 PCP: Patient, No Pcp Per  Patient coming from: Home  Chief Complaint:  Chief Complaint  Patient presents with   Altered Mental Status   HPI: Kaylee [name] is a 82 y.o. female with medical history significant of CVA, hypertension, untreated breast cancer presenting with encephalopathy and hypercalcemia.  Limited history in setting of encephalopathy.  History primarily from family.  Per report, patient with worsening confusion over the past 1 to 2 days.  Noted to have been recently admitted earlier in the month for concern for CVA.  Was discharged on aspirin and Plavix though there is some component concerned about compliance.  No fevers or chills.  Patient with overall progressive decline over several months.  Patient has declined treatment for breast cancer in the past.  No reports of nausea or vomiting.  Positive mild malaise. Presented to the ER afebrile, hemodynamically stable.  White count 11, hemoglobin 10.4, platelets 351, creatinine 0.6, calcium 13.6, alk phos 153.  CT head stable.  Chest ray within normal limits. Review of Systems: As mentioned in the history of present illness. All other systems reviewed and are negative. Past Medical History:  Diagnosis Date   Depression    Fainting spell    Frequent headaches    Hyperlipidemia    Hypertension    Kidney lesion, native, right    Migraine    Stroke [facility])    Thyroid disease    UTI (urinary tract infection)    Past Surgical History:  Procedure Laterality Date   LOOP RECORDER INSERTION N/A 02/24/2018   Procedure: LOOP RECORDER INSERTION;  Surgeon: Kaylee Salvia, MD;  Location: [facility] INVASIVE CV LAB;  Service: Cardiovascular;  Laterality: N/A;   TEE WITHOUT CARDIOVERSION N/A 02/24/2018   Procedure: TRANSESOPHAGEAL ECHOCARDIOGRAM (TEE);  Surgeon: Kaylee Iba, MD;  Location:  [facility] ORS;  Service: Cardiovascular;  Laterality: N/A;   Social History:  reports that she has never smoked. She has never used smokeless tobacco. She reports that she does not currently use alcohol. She reports that she does not currently use drugs.  No Known Allergies  Family History  Problem Relation Age of Onset   Dementia Mother    Hypertension Mother    Hypothyroidism Mother    Prostate cancer Father    Breast cancer Sister 47   Hodgkin's lymphoma Sister     Prior to Admission medications   Medication Sig Start Date End Date Taking? Authorizing Provider  amLODipine (NORVASC) 5 MG tablet Take 1 tablet (5 mg total) by mouth daily. 12/22/22   Kaylee Finner, MD  aspirin EC 81 MG tablet Take 1 tablet (81 mg total) by mouth daily. Swallow whole. 12/23/22   Kaylee Finner, MD  atorvastatin (LIPITOR) 20 MG tablet Take 1 tablet (20 mg total) by mouth daily. 12/24/22   Kaylee Finner, MD  Cholecalciferol (VITAMIN D3) 50 MCG (2000 UT) capsule Take 1 capsule (2,000 Units total) by mouth daily. 03/24/18   Kaylee Harries, FNP  feeding supplement (ENSURE ENLIVE / ENSURE PLUS) LIQD Take 237 mLs by mouth 2 (two) times daily between meals. 12/21/22   Kaylee Finner, MD  Multiple Vitamin (MULTIVITAMIN WITH MINERALS) TABS tablet Take 1 tablet by mouth daily.    [provider]    Physical Exam: Vitals:   01/12/23 1000 01/12/23 1001 01/12/23 1003  BP:   133/82  Pulse:  87  Resp: 20  15  Temp: 98.5 F (36.9 C)    SpO2: 100%  99%  Weight:  58.1 kg   Height:  5\' 2"  (1.575 m)    Physical Exam Constitutional:      Comments: Underweight/cachectic   HENT:     Nose: Nose normal.     Mouth/Throat:     Mouth: Mucous membranes are dry.  Cardiovascular:     Rate and Rhythm: Normal rate and regular rhythm.  Pulmonary:     Effort: Pulmonary effort is normal.  Abdominal:     General: Bowel sounds are normal.  Musculoskeletal:     Comments: + generalized weakness    Skin:    General: Skin is dry.   Neurological:     General: No focal deficit present.     Comments: + generalized confusion.       Data Reviewed:  There are no new results to review at this time. DG Chest Portable 1 View CLINICAL DATA:  Altered mental status  EXAM: PORTABLE CHEST 1 VIEW  COMPARISON:  None Available.  FINDINGS: The heart size and mediastinal contours are within normal limits. Both lungs are clear. The visualized skeletal structures are unremarkable. Cardiac recording device noted.  IMPRESSION: Normal mediastinum and cardiac silhouette. Normal pulmonary vasculature. No evidence of effusion, infiltrate, or pneumothorax. No acute bony abnormality.  Electronically Signed   By: Kaylee [name] M.D.   On: 01/12/2023 11:01 CT HEAD WO CONTRAST ( ) CLINICAL DATA:  Altered mental status.  EXAM: CT HEAD WITHOUT CONTRAST  TECHNIQUE: Contiguous axial images were obtained from the base of the skull through the vertex without intravenous contrast.  RADIATION DOSE REDUCTION: This exam was performed according to the departmental dose-optimization program which includes automated exposure control, adjustment of the mA and/or kV according to patient size and/or use of iterative reconstruction technique.  COMPARISON:  CT head 12/21/2022  FINDINGS: Brain: Bifrontal infractions unchanged from comparison CT. No CT evidence of acute cortical infarction. No extra-axial fluid collections. No parenchymal hemorrhage. Generalized cortical atrophy and dilatation of the ventricles not changed from prior.  Vascular: No hyperdense vessel or unexpected calcification.  Skull: Normal. Negative for fracture or focal lesion.  Sinuses/Orbits: No acute finding.  Other: Cerumen in the external auditory canals.  IMPRESSION: 1. No acute intracranial findings. 2. Bifrontal infarcts unchanged from prior. 3. Generalized cortical atrophy and dilatation of the ventricles not changed from  prior.  Electronically Signed   By: Kaylee [name] M.D.   On: 01/12/2023 10:59  Lab Results  Component Value Date   WBC 11.0 (H) 01/12/2023   HGB 10.4 (L) 01/12/2023   HCT 35.3 (L) 01/12/2023   MCV 85.9 01/12/2023   PLT 351 01/12/2023   Last metabolic panel Lab Results  Component Value Date   GLUCOSE 94 01/12/2023   NA 135 01/12/2023   K 3.5 01/12/2023   CL 97 (L) 01/12/2023   CO2 26 01/12/2023   BUN 25 (H) 01/12/2023   CREATININE 0.59 01/12/2023   GFRNONAA >60 01/12/2023   CALCIUM 13.6 (HH) 01/12/2023   PROT 8.5 (H) 01/12/2023   ALBUMIN 2.3 (L) 01/12/2023   BILITOT 1.0 01/12/2023   ALKPHOS 153 (H) 01/12/2023   AST 79 (H) 01/12/2023   ALT 36 01/12/2023   ANIONGAP 12 01/12/2023    Assessment and Plan: * Hypercalcemia Calcium 13.6 with concomitant encephalopathy in setting of untreated breast cancer Suspect underlying metastatic disease Alk phos 153 IV fluid hydration in the ER Add on  calcitonin Check PTH, ionized calcium, vitamin D levels Serial calciums overnight Monitor  Encephalopathy Progressive worsening confusion in setting of prior history of CVA as well as metastatic breast cancer and likely undiagnosed dementia Calcium level 13.6 today which likely confounding issue for acute decompensation CT head stable Treat per hypercalcemia protocol Monitor  Weakness Progressive worsening weakness at home in the setting of untreated breast cancer with concern for metastasis as well as recently diagnosis of CVA Family is agreeable to palliative care evaluation Will consult Follow  CVA (cerebral vascular accident) (HCC) Admission 9/7-->9/9 for CVA  On asa and statin though unclear about compliance  + generalized encephalopathy in setting of hypercalcemia        Advance Care Planning:   Code Status: Limited: Do not attempt resuscitation (DNR) -DNR-LIMITED -Do Not Intubate/DNI    Consults: Palliative care   Family Communication: Family at the bedside    Severity of Illness: The appropriate patient status for this patient is INPATIENT. Inpatient status is judged to be reasonable and necessary in order to provide the required intensity of service to ensure the patient's safety. The patient's presenting symptoms, physical exam findings, and initial radiographic and laboratory data in the context of their chronic comorbidities is felt to place them at high risk for further clinical deterioration. Furthermore, it is not anticipated that the patient will be medically stable for discharge from the hospital within 2 midnights of admission.   * I certify that at the point of admission it is my clinical judgment that the patient will require inpatient hospital care spanning beyond 2 midnights from the point of admission due to high intensity of service, high risk for further deterioration and high frequency of surveillance required.*  Author: Floydene Flock, MD 01/12/2023 1:54 PM  For on call review [email or web address].

## 2023-01-12 NOTE — Assessment & Plan Note (Signed)
Made comfort care measures

## 2023-01-13 ENCOUNTER — Inpatient Hospital Stay: Payer: Medicare Other

## 2023-01-13 DIAGNOSIS — I10 Essential (primary) hypertension: Secondary | ICD-10-CM

## 2023-01-13 DIAGNOSIS — C792 Secondary malignant neoplasm of skin: Secondary | ICD-10-CM

## 2023-01-13 DIAGNOSIS — Z515 Encounter for palliative care: Secondary | ICD-10-CM

## 2023-01-13 DIAGNOSIS — G9341 Metabolic encephalopathy: Secondary | ICD-10-CM | POA: Diagnosis not present

## 2023-01-13 DIAGNOSIS — R531 Weakness: Secondary | ICD-10-CM

## 2023-01-13 DIAGNOSIS — I639 Cerebral infarction, unspecified: Secondary | ICD-10-CM | POA: Diagnosis not present

## 2023-01-13 DIAGNOSIS — C50911 Malignant neoplasm of unspecified site of right female breast: Secondary | ICD-10-CM | POA: Diagnosis not present

## 2023-01-13 LAB — COMPREHENSIVE METABOLIC PANEL
ALT: 26 U/L (ref 0–44)
AST: 58 U/L — ABNORMAL HIGH (ref 15–41)
Albumin: 1.9 g/dL — ABNORMAL LOW (ref 3.5–5.0)
Alkaline Phosphatase: 112 U/L (ref 38–126)
Anion gap: 12 (ref 5–15)
BUN: 16 mg/dL (ref 8–23)
CO2: 23 mmol/L (ref 22–32)
Calcium: 11 mg/dL — ABNORMAL HIGH (ref 8.9–10.3)
Chloride: 103 mmol/L (ref 98–111)
Creatinine, Ser: 0.47 mg/dL (ref 0.44–1.00)
GFR, Estimated: >60 mL/min (ref >60)
Glucose, Bld: 92 mg/dL (ref 70–99)
Potassium: 3.4 mmol/L — ABNORMAL LOW (ref 3.5–5.1)
Sodium: 138 mmol/L (ref 135–145)
Total Bilirubin: 1.1 mg/dL (ref 0.3–1.2)
Total Protein: 6.7 g/dL (ref 6.5–8.1)

## 2023-01-13 LAB — URINALYSIS, COMPLETE (UACMP) WITH MICROSCOPIC
Bilirubin Urine: NEGATIVE
Glucose, UA: NEGATIVE mg/dL
Ketones, ur: 5 mg/dL — AB
Nitrite: NEGATIVE
Protein, ur: NEGATIVE mg/dL
Specific Gravity, Urine: 1.014 (ref 1.005–1.030)
pH: 5 (ref 5.0–8.0)

## 2023-01-13 LAB — CBC
HCT: 28.1 % — ABNORMAL LOW (ref 36.0–46.0)
Hemoglobin: 8.9 g/dL — ABNORMAL LOW (ref 12.0–15.0)
MCH: 25.7 pg — ABNORMAL LOW (ref 26.0–34.0)
MCHC: 31.7 g/dL (ref 30.0–36.0)
MCV: 81.2 fL (ref 80.0–100.0)
Platelets: 367 10*3/uL (ref 150–400)
RBC: 3.46 MIL/uL — ABNORMAL LOW (ref 3.87–5.11)
RDW: 15.9 % — ABNORMAL HIGH (ref 11.5–15.5)
WBC: 12.6 10*3/uL — ABNORMAL HIGH (ref 4.0–10.5)
nRBC: 0 % (ref 0.0–0.2)

## 2023-01-13 LAB — VITAMIN D 25 HYDROXY (VIT D DEFICIENCY, FRACTURES): Vit D, 25-Hydroxy: 68.17 ng/mL (ref 30–100)

## 2023-01-13 MED ORDER — SODIUM CHLORIDE 0.9 % IV SOLN: 250.0000 mL | INTRAVENOUS | Status: DC | PRN

## 2023-01-13 MED ORDER — ATORVASTATIN CALCIUM 20 MG PO TABS
20.0000 mg | ORAL_TABLET | Freq: Every day | ORAL | Status: DC
  Administered 2023-01-13: 20 mg via ORAL
  Filled 2023-01-13: qty 1

## 2023-01-13 MED ORDER — ZOLEDRONIC ACID 4 MG/100ML IV SOLN
4.0000 mg | Freq: Once | INTRAVENOUS | Status: AC
  Administered 2023-01-13: 4 mg via INTRAVENOUS
  Filled 2023-01-13: qty 100

## 2023-01-13 MED ORDER — POLYVINYL ALCOHOL 1.4 % OP SOLN: 1.0000 [drp] | Freq: Four times a day (QID) | OPHTHALMIC | Status: DC | PRN

## 2023-01-13 MED ORDER — ACETAMINOPHEN 650 MG RE SUPP: 650.0000 mg | Freq: Four times a day (QID) | RECTAL | Status: DC | PRN

## 2023-01-13 MED ORDER — GLYCOPYRROLATE 1 MG PO TABS: 1.0000 mg | ORAL_TABLET | ORAL | Status: DC | PRN

## 2023-01-13 MED ORDER — HALOPERIDOL LACTATE 5 MG/ML IJ SOLN: 0.5000 mg | INTRAMUSCULAR | Status: DC | PRN

## 2023-01-13 MED ORDER — GLYCOPYRROLATE 0.2 MG/ML IJ SOLN: 0.2000 mg | INTRAMUSCULAR | Status: DC | PRN

## 2023-01-13 MED ORDER — HALOPERIDOL 0.5 MG PO TABS: 0.5000 mg | ORAL_TABLET | ORAL | Status: DC | PRN

## 2023-01-13 MED ORDER — HALOPERIDOL LACTATE 2 MG/ML PO CONC: 0.5000 mg | ORAL | Status: DC | PRN

## 2023-01-13 MED ORDER — ASPIRIN 81 MG PO CHEW
81.0000 mg | CHEWABLE_TABLET | Freq: Every day | ORAL | Status: DC
  Administered 2023-01-13: 81 mg via ORAL
  Filled 2023-01-13: qty 1

## 2023-01-13 MED ORDER — ASPIRIN 300 MG RE SUPP
300.0000 mg | Freq: Every day | RECTAL | Status: DC
  Filled 2023-01-13: qty 1

## 2023-01-13 MED ORDER — GADOBUTROL 1 MMOL/ML IV SOLN
5.0000 mL | Freq: Once | INTRAVENOUS | Status: AC | PRN
  Administered 2023-01-13: 5 mL via INTRAVENOUS

## 2023-01-13 MED ORDER — ACETAMINOPHEN 325 MG PO TABS: 650.0000 mg | ORAL_TABLET | Freq: Four times a day (QID) | ORAL | Status: DC | PRN

## 2023-01-13 MED ORDER — SODIUM CHLORIDE 0.9% FLUSH: 3.0000 mL | INTRAVENOUS | Status: DC | PRN

## 2023-01-13 MED ORDER — BIOTENE DRY MOUTH MT LIQD: 15.0000 mL | OROMUCOSAL | Status: DC | PRN

## 2023-01-13 MED ORDER — AMLODIPINE BESYLATE 5 MG PO TABS
5.0000 mg | ORAL_TABLET | Freq: Every day | ORAL | Status: DC
  Administered 2023-01-13: 5 mg via ORAL
  Filled 2023-01-13: qty 1

## 2023-01-13 MED ORDER — ONDANSETRON HCL 4 MG/2ML IJ SOLN: 4.0000 mg | Freq: Four times a day (QID) | INTRAMUSCULAR | Status: DC | PRN

## 2023-01-13 MED ORDER — SODIUM CHLORIDE 0.9% FLUSH
3.0000 mL | Freq: Two times a day (BID) | INTRAVENOUS | Status: DC
  Administered 2023-01-13: 3 mL via INTRAVENOUS

## 2023-01-13 MED ORDER — MORPHINE SULFATE (PF) 2 MG/ML IV SOLN: 1.0000 mg | INTRAVENOUS | Status: DC | PRN

## 2023-01-13 MED ORDER — ONDANSETRON 4 MG PO TBDP: 4.0000 mg | ORAL_TABLET | Freq: Four times a day (QID) | ORAL | Status: DC | PRN

## 2023-01-13 NOTE — Evaluation (Signed)
Occupational Therapy Evaluation Patient Details Name: Kaylee [name] MRN: [id number] DOB: 1940-11-21 Today's Date: 01/13/2023   History of Present Illness Kaylee [name] is a 82 y.o. female with a past medical history of depression, hypertension, breast cancer, UTI, MI, hyperlipidemia, CVA, presents to the emergency department for altered mental status. Admitted with hypercalcemia.   Clinical Impression   Pt seen today for OT eval (PT entering upon need for +2 assist due to pt's CLOF). OT orders cancelled after OT performed eval this morning (pt seen by palliative care, is now placed on comfort care measures). Pt only A&Ox1, PLOF and history obtained from chart review. According to chart, pt lives alone in 1 level home with 5 STE, was MOD I with QC for ADLs and mobility. Currently, pt requires +2 for bed mobility and anticipate TOTAL-MAX for ADL performance. Pt performing supine to sit with MAX A +2, sitting EOB with MIN - MAX A to correct heavy posterior lean not able to correct with multimodal cuing and increased time. Due to OT orders being cancelled, OT will complete orders and sign off.       If plan is discharge home, recommend the following: Two people to help with walking and/or transfers;A lot of help with bathing/dressing/bathroom;Assistance with cooking/housework;Assistance with feeding;Direct supervision/assist for medications management;Direct supervision/assist for financial management;Assist for transportation;Help with stairs or ramp for entrance;Supervision due to cognitive status    Functional Status Assessment  Patient has had a recent decline in their functional status and demonstrates the ability to make significant improvements in function in a reasonable and predictable amount of time.  Equipment Recommendations  None recommended by OT    Recommendations for Other Services Other (comment)     Precautions / Restrictions Precautions Precautions:  Fall Restrictions Weight Bearing Restrictions: No      Mobility Bed Mobility Overal bed mobility: Needs Assistance Bed Mobility: Rolling Rolling: Total assist, +2 for physical assistance (total for pericare)   Supine to sit: Max assist, +2 for physical assistance, HOB elevated     General bed mobility comments: use of bed pad, pt participating minimally    Transfers Overall transfer level: Needs assistance                 General transfer comment: NT this date; unsafe to perform with pt's current static sitting EOB and heavy posterior lean      Balance Overall balance assessment: Needs assistance Sitting-balance support: Bilateral upper extremity supported, Feet supported Sitting balance-Leahy Scale: Zero Sitting balance - Comments: pt unable to correct posterior lean, even with multimodal cuing and increased time Postural control: Posterior lean (min-MAX for sitting EOB, pt with heavy posterior lean/push)                                 ADL either performed or assessed with clinical judgement   ADL Overall ADL's : Needs assistance/impaired                     Lower Body Dressing: Maximal assistance;Bed level Lower Body Dressing Details (indicate cue type and reason): From supine, OT encourges pt to don socks. Pt able to put toes in sock, but requires MAX A to complete               General ADL Comments: Pt functionally limited by cognition, decreased activity tolerance, generalized weakness and residual limitations from prior CVA. Anticipate pt requiring MAX -  TOTAL for ADL performance this date.     Vision Patient Visual Report: Other (comment) (Pt unable to locate clock on wall)          Praxis Praxis: Impaired Praxis Impairment Details: Motor planning     Pertinent Vitals/Pain Pain Assessment Pain Assessment: No/denies pain     Extremity/Trunk Assessment Upper Extremity Assessment Upper Extremity Assessment: Generalized  weakness (L sided deficits from CVA (unable to make composite fist LUE), pt able to raise arms to ~90 deg of shld flexion in supine position. Assessment limited due to patient's inability to consistently follow directions)   Lower Extremity Assessment Lower Extremity Assessment: Generalized weakness;Defer to PT evaluation       Communication Communication Communication: Difficulty following commands/understanding Following commands: Follows one step commands inconsistently Cueing Techniques: Verbal cues;Gestural cues;Visual cues   Cognition Arousal: Lethargic Behavior During Therapy: Flat affect Overall Cognitive Status: No family/caregiver present to determine baseline cognitive functioning                                 General Comments: Pt only entered to self. Generally confused and answers questions inconsistently                Home Living Family/patient expects to be discharged to:: Private residence Living Arrangements: Other relatives Available Help at Discharge: Family Type of Home: House Home Access: Stairs to enter [employer] of Steps: 5 steps at sunroom entrance with B rails Entrance Stairs-Rails: Right;Left Home Layout: One level     Bathroom Shower/Tub: Chief Strategy Officer: Standard     Home Equipment: Agricultural consultant (2 wheels);Cane - single point;Shower seat;BSC/3in1;Tub bench          Prior Functioning/Environment Prior Level of Function : Patient poor historian/Family not available;Other (comment) (History taken from chart)             Mobility Comments: Pt ambulated with cane prior to admission, ADLs Comments: Sisters assisted with bathing                        Co-evaluation PT/OT/SLP Co-Evaluation/Treatment: Yes Reason for Co-Treatment: Complexity of the patient's impairments (multi-system involvement);Necessary to address cognition/behavior during functional activity;For patient/therapist  safety PT goals addressed during session: Mobility/safety with mobility;Balance OT goals addressed during session: ADL's and self-care      AM-PAC OT "6 Clicks" Daily Activity     Outcome Measure Help from another person eating meals?: A Little Help from another person taking care of personal grooming?: A Lot Help from another person toileting, which includes using toliet, bedpan, or urinal?: Total Help from another person bathing (including washing, rinsing, drying)?: Total Help from another person to put on and taking off regular upper body clothing?: Total Help from another person to put on and taking off regular lower body clothing?: Total 6 Click Score: 9   End of Session Nurse Communication: Mobility status  Activity Tolerance: Patient limited by lethargy Patient left: in bed;with bed alarm set;with SCD's reapplied;with call bell/phone within reach                   Time: 5284-1324 OT Time Calculation (min): 43 min Charges:  OT General Charges $OT Visit: 1 Visit OT Evaluation $OT Eval Moderate Complexity: 1 Mod Rebakah Cokley L. Wai Litt, OTR/L  01/13/23, 4:26 PM

## 2023-01-13 NOTE — Evaluation (Signed)
Clinical/Bedside Swallow Evaluation Patient Details  Name: Kaylee [name] MRN: [id number] Date of Birth: 09/03/1940  Today's Date: 01/13/2023 Time: SLP Start Time (ACUTE ONLY): 2952 SLP Stop Time (ACUTE ONLY): 1008 SLP Time Calculation (min) (ACUTE ONLY): 13 min  Past Medical History:  Past Medical History:  Diagnosis Date   Depression    Fainting spell    Frequent headaches    Hyperlipidemia    Hypertension    Kidney lesion, native, right    Migraine    Stroke [facility])    Thyroid disease    UTI (urinary tract infection)    Past Surgical History:  Past Surgical History:  Procedure Laterality Date   LOOP RECORDER INSERTION N/A 02/24/2018   Procedure: LOOP RECORDER INSERTION;  Surgeon: Duke Salvia, MD;  Location: [facility] INVASIVE CV LAB;  Service: Cardiovascular;  Laterality: N/A;   TEE WITHOUT CARDIOVERSION N/A 02/24/2018   Procedure: TRANSESOPHAGEAL ECHOCARDIOGRAM (TEE);  Surgeon: Antonieta Iba, MD;  Location: [facility] ORS;  Service: Cardiovascular;  Laterality: N/A;   HPI:  Pt is an "Kaylee [name] with medical history significant of CVA, hypertension, untreated breast cancer presenting with encephalopathy and hypercalcemia.  Limited history in setting of encephalopathy.  History primarily from family.  Per report, patient with worsening confusion over the past 1 to 2 days.  Noted to have been recently admitted earlier in the month for concern for CVA.  Was discharged on aspirin and Plavix though there is some component concerned about compliance.  No fevers or chills.  Patient with overall progressive decline over several months.  Patient has declined treatment for breast cancer in the past.  No reports of nausea or vomiting.  Positive mild malaise." (per Physician's H&P).    Assessment / Plan / Recommendation  Clinical Impression  Pt seen for clinical swallowing evaluation. Pt alert, slow to respond. Notable confusion. Fluctuating vocal loudness. Aphonic at times. Pt unable to  follow commands for oral motor examination due to AMS. Pt given trials of thin liquids, puree, and solid. Pt presents with s/sx at least a moderate oral dysphagia c/b oral holding/delayed oral transit with purees and solids as well as prolonged/inefficient mastication and oral residual with solids. No overt s/sx pharyngel dysphagia noted; however, pt is at increased risk for aspiration/aspiration PNA given cognitive decline, AMS, comorbidities, and dependence for feeding at present. Recommend diet downgrade to Dysphagia 1 with Thin Liquids with safe swallowing strategies/aspiration precautions as outlined below. SLP to f/u per POC for diet tolerance and trials of upgraded textures pending improvement in pt's mental status.  SLP Visit Diagnosis: Dysphagia, oral phase (R13.11)    Aspiration Risk  Mild aspiration risk;Moderate aspiration risk;Risk for inadequate nutrition/hydration    Diet Recommendation Dysphagia 1 (Puree);Thin liquid    Medication Administration: Crushed with puree Supervision: Staff to assist with self feeding;Full supervision/cueing for compensatory strategies Compensations: Minimize environmental distractions;Slow rate;Small sips/bites;Follow solids with liquid Postural Changes: Seated upright at 90 degrees;Remain upright for at least 30 minutes after po intake    Other  Recommendations Oral Care Recommendations: Oral care BID;Staff/trained caregiver to provide oral care    Recommendations for follow up therapy are one component of a multi-disciplinary discharge planning process, led by the attending physician.  Recommendations may be updated based on patient status, additional functional criteria and insurance authorization.  Follow up Recommendations Follow physician's recommendations for discharge plan and follow up therapies         Functional Status Assessment Patient has had a recent decline in  their functional status and demonstrates the ability to make significant  improvements in function in a reasonable and predictable amount of time.  Frequency and Duration min 2x/week  2 weeks       Prognosis Prognosis for improved oropharyngeal function: Fair Barriers to Reach Goals: Cognitive deficits;Severity of deficits      Swallow Study   General Date of Onset: 01/12/23 HPI: Pt is an "Kaylee [name] with medical history significant of CVA, hypertension, untreated breast cancer presenting with encephalopathy and hypercalcemia.  Limited history in setting of encephalopathy.  History primarily from family.  Per report, patient with worsening confusion over the past 1 to 2 days.  Noted to have been recently admitted earlier in the month for concern for CVA.  Was discharged on aspirin and Plavix though there is some component concerned about compliance.  No fevers or chills.  Patient with overall progressive decline over several months.  Patient has declined treatment for breast cancer in the past.  No reports of nausea or vomiting.  Positive mild malaise." (per Physician's H&P). Type of Study: Bedside Swallow Evaluation Previous Swallow Assessment: clinical swallowing evaluation this month; see notes Diet Prior to this Study: Regular;Thin liquids (Level 0) Temperature Spikes Noted: No Respiratory Status: Room air History of Recent Intubation: No Behavior/Cognition: Alert;Distractible;Requires cueing;Doesn't follow directions Oral Cavity Assessment: Within Functional Limits Oral Care Completed by SLP: Yes Oral Cavity - Dentition: Adequate natural dentition Vision: Functional for self-feeding Self-Feeding Abilities: Total assist Patient Positioning: Upright in bed Baseline Vocal Quality: Aphonic;Low vocal intensity Volitional Cough: Cognitively unable to elicit Volitional Swallow: Unable to elicit    Oral/Motor/Sensory Function     Ice Chips Ice chips: Not tested   Thin Liquid Thin Liquid: Within functional limits Presentation: Straw    Nectar Thick  Nectar Thick Liquid: Not tested   Honey Thick Honey Thick Liquid: Not tested   Puree Puree: Impaired Presentation: Spoon Oral Phase Impairments: Poor awareness of bolus Oral Phase Functional Implications: Oral holding;Prolonged oral transit Pharyngeal Phase Impairments:  (WFL)   Solid     Solid: Impaired Presentation: Spoon Oral Phase Impairments: Poor awareness of bolus;Impaired mastication Oral Phase Functional Implications: Impaired mastication;Oral residue;Oral holding;Prolonged oral transit Pharyngeal Phase Impairments:  (WFL)     Clyde Canterbury, M.S., CCC-SLP Speech-Language Pathologist [facility] [phone] (ASCOM)  Alessandra Bevels Maven Rosander 01/13/2023,11:58 AM

## 2023-01-13 NOTE — Plan of Care (Signed)
  Problem: Fluid Volume: Goal: Hemodynamic stability will improve Outcome: Progressing   Problem: Clinical Measurements: Goal: Diagnostic test results will improve Outcome: Progressing Goal: Signs and symptoms of infection will decrease Outcome: Progressing   Problem: Respiratory: Goal: Ability to maintain adequate ventilation will improve Outcome: Progressing   Problem: Education: Goal: Knowledge of condition and prescribed therapy will improve Outcome: Progressing   Problem: Cardiac: Goal: Will achieve and/or maintain adequate cardiac output Outcome: Progressing   Problem: Physical Regulation: Goal: Complications related to the disease process, condition or treatment will be avoided or minimized Outcome: Progressing   Problem: Education: Goal: Knowledge of General Education information will improve Description: Including pain rating scale, medication(s)/side effects and non-pharmacologic comfort measures Outcome: Progressing   Problem: Health Behavior/Discharge Planning: Goal: Ability to manage health-related needs will improve Outcome: Progressing   Problem: Clinical Measurements: Goal: Ability to maintain clinical measurements within normal limits will improve Outcome: Progressing Goal: Will remain free from infection Outcome: Progressing Goal: Diagnostic test results will improve Outcome: Progressing Goal: Respiratory complications will improve Outcome: Progressing Goal: Cardiovascular complication will be avoided Outcome: Progressing   Problem: Activity: Goal: Risk for activity intolerance will decrease Outcome: Progressing   Problem: Nutrition: Goal: Adequate nutrition will be maintained Outcome: Progressing   Problem: Coping: Goal: Level of anxiety will decrease Outcome: Progressing   Problem: Elimination: Goal: Will not experience complications related to bowel motility Outcome: Progressing Goal: Will not experience complications related to  urinary retention Outcome: Progressing   Problem: Pain Managment: Goal: General experience of comfort will improve Outcome: Progressing   Problem: Safety: Goal: Ability to remain free from injury will improve Outcome: Progressing   Problem: Skin Integrity: Goal: Risk for impaired skin integrity will decrease Outcome: Progressing

## 2023-01-13 NOTE — Assessment & Plan Note (Signed)
Made comfort care measures

## 2023-01-13 NOTE — Hospital Course (Signed)
82 year old female past medical history of CVA, hypertension, untreated breast cancer presented with acute metabolic encephalopathy and found to have hypercalcemia.  Patient has not been walking around too much for the past week.  9/30.  Calcium still elevated at 11 and still with altered mental status will give a dose of Zometa.

## 2023-01-13 NOTE — TOC Initial Note (Addendum)
Transition of Care [facility]) - Initial/Assessment Note    Patient Details  Name: Kaylee [name] MRN: [id number] Date of Birth: [birth year]  Transition of Care [facility]) CM/SW Contact:    Allena Katz, LCSW Phone Number: 01/13/2023, 9:47 AM  Clinical Narrative:       Pt admitted from home with sister with AMS and hypercalcemia. Pt was discharged on  9/9. TOC had arranged [facility]. Message has been sent to cheryl with [facility] to see if they had been able to admit patient. [facility] also was working on getting pt established for primary care. Pt sister was working on Scientist, clinical (histocompatibility and immunogenetics) duty aides to sit with patient at home. CSW to follow up to see if this was arranged. PT/OT/SPEECH has been ordered. TOC to follow up once evaluations have been completed.               10:36 Per Elnita Maxwell with [facility] pt was opened on 9/10 PT/OT.   Patient Goals and CMS Choice            Expected Discharge Plan and Services                                              Prior Living Arrangements/Services   Lives with:: Self                   Activities of Daily Living   ADL Screening (condition at time of admission) Does the patient have a NEW difficulty with bathing/dressing/toileting/self-feeding that is expected to last >3 days?: Yes (Initiates electronic notice to provider for possible OT consult) Does the patient have a NEW difficulty with getting in/out of bed, walking, or climbing stairs that is expected to last >3 days?: Yes (Initiates electronic notice to provider for possible PT consult) Does the patient have a NEW difficulty with communication that is expected to last >3 days?: No Is the patient deaf or have difficulty hearing?: No Does the patient have difficulty seeing, even when wearing glasses/contacts?: No Does the patient have difficulty concentrating, remembering, or making decisions?: Yes  Permission Sought/Granted                  Emotional Assessment        Orientation: : Fluctuating Orientation (Suspected and/or reported Sundowners)      Admission diagnosis:  Hypercalcemia [E83.52] Weakness [R53.1] Patient Active Problem List   Diagnosis Date Noted   Hypercalcemia 01/12/2023   Encephalopathy 01/12/2023   Acute CVA (cerebrovascular accident) (HCC) 12/22/2022   Hypokalemia 12/21/2022   Sepsis (HCC) 12/20/2022   Sepsis due to undetermined organism (HCC) 12/19/2022   Metabolic acidosis 12/19/2022   History of CVA (cerebrovascular accident) 12/19/2022   Coronary artery disease 12/19/2022   Failure to thrive in adult 12/19/2022   Fall at home, initial encounter 12/19/2022   Weakness 12/19/2022   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) 08/15/2020   Syncope 07/15/2020   NSTEMI (non-ST elevated myocardial infarction) (HCC) 07/15/2020   Essential hypertension 02/10/2018   Hypothyroidism 02/10/2018   Hyperlipidemia 02/10/2018   CVA (cerebral vascular accident) (HCC) 02/05/2018   PCP:  Patient, No Pcp Per Pharmacy:   [facility] [address], [place] - [address] [place] [address] Phone: [phone] Fax: [phone]     Social Determinants of Health (SDOH) Social History: SDOH Screenings   Food  Insecurity: No Food Insecurity (01/12/2023)  Housing: Low Risk  (01/12/2023)  Transportation Needs: No Transportation Needs (01/12/2023)  Utilities: Not At Risk (01/12/2023)  Tobacco Use: Low Risk  (01/12/2023)   SDOH Interventions:     Readmission Risk Interventions    12/21/2022   10:47 AM  Readmission Risk Prevention Plan  Post Dischage Appt Complete  Medication Screening Complete  Transportation Screening Complete

## 2023-01-13 NOTE — Consult Note (Signed)
Consultation Note Date: 01/13/2023 at 1230  Patient Name: Kaylee [name]  DOB: [birth year]  MRN: [id number]  Age / Sex: 82 y.o., female  PCP: Patient, No Pcp Per Referring Physician: Alford Highland, MD  HPI/Patient Profile: 82 y.o. female  with past medical history of UTI, CVA, HTN, and untreated cancer of right breast admitted on 01/12/2023 with weakness and confusion for 1 to 2 days.  Patient is being treated for encephalopathy and hypercalcemia.  PMT was consulted to discuss goals of care.  Clinical Assessment and Goals of Care: Extensive chart review completed prior to meeting patient including labs, vital signs, imaging, progress notes, orders, and available advanced directive documents from current and previous encounters. I then met with patient and her son Kaylee [name] at bedside to discuss diagnosis prognosis, GOC, EOL wishes, disposition and options.  I introduced Palliative Medicine as specialized medical care for people living with serious illness. It focuses on providing relief from the symptoms and stress of a serious illness. The goal is to improve quality of life for both the patient and the family.  We discussed a brief life review of the patient.  Patient is a widow and had 1 son, Kaylee [name], and 2 granddaughters.  She worked in the [employer].  Kaylee [name] shares her his mother never liked seeing the doctor, going to appointments, or being in the hospital.  As far as functional and nutritional status Kaylee [name] has noted a significant decline.  Patient has not wanted to get out of bed for the past several weeks.  Her p.o. intake has significantly decreased.  She sleeps more throughout the day and is minimally interactive with him.  We discussed patient's current illness and what it means in the larger context of patient's on-going co-morbidities.  Discussed side effects of hypercalcemia and untreated breast cancer.   Natural disease trajectory and expectations at EOL were discussed.  I attempted to elicit values and goals of care important to the patient.  Kaylee [name] shares his mother would never want aggressive measures.  He shares he spoke with her in detail about her goals of care when she was diagnosed with breast cancer almost 2 years ago.  She did not want to have any treatment.  Kaylee [name] shares she like to be left alone and did not want to have any drastic measures taken to artificially sustain her life.  In light of these comments, the difference between aggressive medical intervention and comfort care was considered.  We discussed comfort measures.  [name] would no longer receive aggressive medical interventions such as continuous vital signs, lab work, radiology testing, or medications not focused on comfort. All care would focus on how she is looking and feeling. This would include management of any symptoms that may cause discomfort, pain, shortness of breath, cough, nausea, agitation, anxiety, and/or secretions etc. Symptoms would be managed with medications and other non-pharmacological interventions such as spiritual support if requested, repositioning, music therapy, or therapeutic listening.   Kaylee [name] verbalized understanding and appreciation.   Advance directives, concepts specific  to code status, artificial feeding and hydration, and rehospitalization were considered and discussed. Kaylee [name] endorses DNR and DNI status.  We discussed comfort feeds-allowing small supervised bites of soft foods including ice cream, applesauce, mashed potatoes etc. 1 patient is awake, alert, and can safely swallow.  Kaylee [name] endorses he would like to move forward with full comfort measures.  Space and opportunity provided for Kaylee [name] to ask questions.  Therapeutic silence, active listening, and emotional support provided.  Spiritual care consult offered and accepted.  Spiritual care consult placed.  We discussed liberating  patient from aggressive treatments and focusing on her symptoms.  I shared that patient is not actively dying but that with such poor functional, nutritional, and cognitive status then she was approaching EOL.  We discussed potential for transfer out of the hospital but have deferred further discussions and decisions until perhaps tomorrow.  Of discussion conveyed to attending and RN.  Appropriate adjustments made to [facility].  PMT will continue to follow and support patient and family throughout her hospitalization.  Primary Decision Maker NEXT OF KIN  Physical Exam Vitals reviewed.  Constitutional:      General: She is not in acute distress. HENT:     Head: Normocephalic.     Mouth/Throat:     Mouth: Mucous membranes are moist.  Abdominal:     Palpations: Abdomen is soft.  Musculoskeletal:     Comments: Generalized weakness  Skin:    General: Skin is warm and dry.     Coloration: Skin is pale.  Neurological:     General: No focal deficit present.     Comments: Made no vocalizations during my visit     Palliative Assessment/Data: 30%     Thank you for this consult. Palliative medicine will continue to follow and assist holistically.   Time Total: 75 minutes  Time spent includes: Detailed review of medical records (labs, imaging, vital signs), medically appropriate exam (mental status, respiratory, cardiac, skin), discussed with treatment team, counseling and educating patient, family and staff, documenting clinical information, medication management and coordination of care.  Signed by: Georgiann Cocker, DNP, FNP-BC Palliative Medicine   Please contact Palliative Medicine Team providers via [facility] for questions and concerns.

## 2023-01-13 NOTE — Assessment & Plan Note (Signed)
Patient has declined treatment in the past.  Patient will go home with hospice.

## 2023-01-13 NOTE — Progress Notes (Signed)
Progress Note   Patient: Kaylee [name]:[id number] DOB: 06-Nov-1940 DOA: 01/12/2023     1 DOS: the patient was seen and examined on 01/13/2023   Brief hospital course: 82 year old female past medical history of CVA, hypertension, untreated breast cancer presented with acute metabolic encephalopathy and found to have hypercalcemia.  Patient has not been walking around too much for the past week.  9/30.  Calcium still elevated at 11 and still with altered mental status will give a dose of Zometa.  Assessment and Plan: * Hypercalcemia Calcium 13.6 on presentation with acute metabolic encephalopathy.  Calcium down to 11.0 with calcitonin and IV fluids.  Will give Zometa.  Suspect secondary to metastatic breast cancer.  PTH pending.  Patient not eating and drinking very well and not moving well for the past week which could also be a cause of hypercalcemia.  Acute metabolic encephalopathy Likely secondary to hypercalcemia.  CVA (cerebral vascular accident) [facility]) Recent CVA.  On aspirin and Lipitor.  Will get MRI of the brain with and without contrast to rule out mass or further stroke.   Weakness Progressive weakness at home.  Will get an MRI of the brain.  Palliative care consultation.  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  Palliative care consultation.        Subjective: Patient seen this morning and pretty lethargic.  Was not moving her right side very well.  Admitted with hypercalcemia and history of breast cancer.  Physical Exam: Vitals:   01/12/23 2110 01/13/23 0016 01/13/23 0505 01/13/23 0811  BP: (!) 142/87 139/86 134/86 (!) 125/113  Pulse: 99 90 83 76  Resp: 16 18 18 17   Temp: 98.2 F (36.8 C) 97.9 F (36.6 C) (!) 97.4 F (36.3 C) (!) 97.5 F (36.4 C)  TempSrc:   Oral   SpO2: 99% 98% 100% 98%  Weight:      Height:       Physical Exam HENT:     Head:  Normocephalic.  Eyes:     General: Lids are normal.     Conjunctiva/sclera: Conjunctivae normal.  Cardiovascular:     Rate and Rhythm: Normal rate and regular rhythm.     Heart sounds: Normal heart sounds, S1 normal and S2 normal.  Pulmonary:     Breath sounds: No decreased breath sounds, wheezing, rhonchi or rales.  Abdominal:     Palpations: Abdomen is soft.     Tenderness: There is no abdominal tenderness.  Musculoskeletal:     Right lower leg: No swelling.     Left lower leg: No swelling.  Skin:    General: Skin is warm.     Findings: No rash.  Neurological:     Mental Status: She is lethargic.     Comments: Not moving her right side very well for me.  Weak on the left side also.     Data Reviewed: Calcium 11, potassium 3.4, albumin 1.9, AST 58, vitamin D 68.17, white blood cell count 12.6, hemoglobin 8.9, platelet count 367  Family Communication: Spoke with son on the phone  Disposition: Status is: Inpatient Remains inpatient appropriate because: Treatment of hypercalcemia with Zometa and calcitonin and IV fluids.  MRI of the brain to rule out stroke versus mets.  Planned Discharge Destination: To be determined    Time spent: 28 minutes  Author: Alford Highland, MD 01/13/2023 11:51 AM  For on call review [email or web address].

## 2023-01-13 NOTE — Evaluation (Signed)
Physical Therapy Evaluation Patient Details Name: Kaylee [name] MRN: [id number] DOB: [birth year] Today's Date: 01/13/2023  History of Present Illness  Pt is an 82 y.o. female presenting to hospital 01/12/23 with c/o AMS.  Pt admitted with hypercalcemia, encephalopathy, and weakness.  PMH includes htn, UTI, breast CA (untreated per notes), stroke.  Clinical Impression  PT/OT co-evaluation performed in morning; PT consult cancelled later in day by Palliative Care (pt went comfort care).  During evaluation pt oriented to self only and unable to verbalize PLOF or home situation (information obtained from chart review).  Currently pt is max assist x2 for bed mobility and min to max assist for sitting balance d/t significant posterior lean/push (pt unable to fully correct with max cueing).  D/t PT consult being cancelled, PT will sign off.    If plan is discharge home, recommend the following: Two people to help with walking and/or transfers;Two people to help with bathing/dressing/bathroom;Assistance with cooking/housework;Direct supervision/assist for medications management;Direct supervision/assist for financial management;Assist for transportation;Help with stairs or ramp for entrance   Can travel by private vehicle   No    Equipment Recommendations Other (comment) (TBD at next venue of care)  Recommendations for Other Services       Functional Status Assessment Patient has had a recent decline in their functional status and demonstrates the ability to make significant improvements in function in a reasonable and predictable amount of time.     Precautions / Restrictions Precautions Precautions: Fall Restrictions Weight Bearing Restrictions: No      Mobility  Bed Mobility Overal bed mobility: Needs Assistance Bed Mobility: Rolling Rolling: Total assist, +2 for physical assistance (total for peri-care)   Supine to sit: Max assist, +2 for physical assistance, HOB elevated      General bed mobility comments: use of bed pad; assist for trunk and B LE's    Transfers                   General transfer comment: Deferred d/t pt unable to maintain sitting balance safely to attempt    Ambulation/Gait                  Stairs            Wheelchair Mobility     Tilt Bed    Modified Rankin (Stroke Patients Only)       Balance Overall balance assessment: Needs assistance Sitting-balance support: Bilateral upper extremity supported, Feet supported Sitting balance-Leahy Scale: Zero Sitting balance - Comments: Pt fluctuating between min to max assist for sitting balance d/t posterior lean (pt unable to correct fully with multimodal cueing and increased time) Postural control: Posterior lean (posterior lean/push)                                   Pertinent Vitals/Pain Pain Assessment Pain Assessment: Faces Faces Pain Scale: No hurt Pain Intervention(s): Limited activity within patient's tolerance, Monitored during session Vitals (HR and SpO2 on room air) stable and WFL throughout treatment session.    Home Living Family/patient expects to be discharged to:: Private residence Living Arrangements: Other relatives (Pt reports living with her sister) Available Help at Discharge: Family Type of Home: House Home Access: Stairs to enter Entrance Stairs-Rails: Doctor, general practice of Steps: 5 steps at sunroom entrance with B rails   Home Layout: One level Home Equipment: Agricultural consultant (2 wheels);Cane - single point;Shower seat;BSC/3in1;Tub bench Additional  Comments: Information taken from chart (pt unable to verbalize home set-up or PLOF).    Prior Function Prior Level of Function : Patient poor historian/Family not available;Other (comment) (History taken from chart)             Mobility Comments: Pt ambulated with cane prior to admission ADLs Comments: Sisters assisted with bathing      Extremity/Trunk Assessment   Upper Extremity Assessment Upper Extremity Assessment: Defer to OT evaluation    Lower Extremity Assessment Lower Extremity Assessment: Generalized weakness;Difficult to assess due to impaired cognition (h/o stroke with L sided weakness)    Cervical / Trunk Assessment Cervical / Trunk Assessment: Normal  Communication   Communication Communication: Difficulty following commands/understanding Following commands: Follows one step commands inconsistently Cueing Techniques: Verbal cues;Gestural cues;Visual cues  Cognition Arousal: Lethargic Behavior During Therapy: Flat affect Overall Cognitive Status: No family/caregiver present to determine baseline cognitive functioning                                 General Comments: Oriented to self only.  General confusion noted and inconsistent with answers.        General Comments  Nursing cleared pt for participation in physical therapy.  Pt appearing agreeable to PT session.    Exercises     Assessment/Plan    PT Assessment Patient needs continued PT services  PT Problem List Decreased strength;Decreased activity tolerance;Decreased balance;Decreased mobility;Decreased cognition;Decreased knowledge of use of DME;Decreased safety awareness;Decreased knowledge of precautions       PT Treatment Interventions DME instruction;Gait training;Stair training;Functional mobility training;Therapeutic activities;Therapeutic exercise;Balance training;Patient/family education;Neuromuscular re-education;Cognitive remediation    PT Goals (Current goals can be found in the Care Plan section)  Acute Rehab PT Goals Patient Stated Goal: to improve mobility PT Goal Formulation: With patient Time For Goal Achievement: 01/27/23 Potential to Achieve Goals: Fair    Frequency Min 1X/week     Co-evaluation PT/OT/SLP Co-Evaluation/Treatment: Yes Reason for Co-Treatment: Complexity of the patient's  impairments (multi-system involvement);Necessary to address cognition/behavior during functional activity;For patient/therapist safety PT goals addressed during session: Mobility/safety with mobility;Balance OT goals addressed during session: ADL's and self-care       AM-PAC PT "6 Clicks" Mobility  Outcome Measure Help needed turning from your back to your side while in a flat bed without using bedrails?: Total Help needed moving from lying on your back to sitting on the side of a flat bed without using bedrails?: Total Help needed moving to and from a bed to a chair (including a wheelchair)?: Total Help needed standing up from a chair using your arms (e.g., wheelchair or bedside chair)?: Total Help needed to walk in hospital room?: Total Help needed climbing 3-5 steps with a railing? : Total 6 Click Score: 6    End of Session   Activity Tolerance: Patient limited by fatigue Patient left: in bed;with call bell/phone within reach;with bed alarm set Nurse Communication: Mobility status;Precautions PT Visit Diagnosis: Other abnormalities of gait and mobility (R26.89);Muscle weakness (generalized) (M62.81)    Time: 4098-1191 PT Time Calculation (min) (ACUTE ONLY): 26 min   Charges:   PT Evaluation $PT Eval Low Complexity: 1 Low PT Treatments $Therapeutic Activity: 8-22 mins PT General Charges $$ ACUTE PT VISIT: 1 Visit       Hendricks Limes, PT 01/13/23, 2:56 PM

## 2023-01-13 NOTE — Assessment & Plan Note (Signed)
Likely secondary to hypercalcemia.

## 2023-01-14 DIAGNOSIS — C50911 Malignant neoplasm of unspecified site of right female breast: Secondary | ICD-10-CM | POA: Diagnosis not present

## 2023-01-14 DIAGNOSIS — R531 Weakness: Secondary | ICD-10-CM | POA: Diagnosis not present

## 2023-01-14 DIAGNOSIS — I63522 Cerebral infarction due to unspecified occlusion or stenosis of left anterior cerebral artery: Secondary | ICD-10-CM

## 2023-01-14 DIAGNOSIS — C792 Secondary malignant neoplasm of skin: Secondary | ICD-10-CM | POA: Diagnosis not present

## 2023-01-14 DIAGNOSIS — G9341 Metabolic encephalopathy: Secondary | ICD-10-CM | POA: Diagnosis not present

## 2023-01-14 DIAGNOSIS — Z515 Encounter for palliative care: Secondary | ICD-10-CM | POA: Diagnosis not present

## 2023-01-14 LAB — PTH, INTACT AND CALCIUM
Calcium, Total (PTH): 12.1 mg/dL — ABNORMAL HIGH (ref 8.7–10.3)
PTH: 31 pg/mL (ref 15–65)

## 2023-01-14 LAB — CALCITRIOL (1,25 DI-OH VIT D): Vit D, 1,25-Dihydroxy: 41.5 pg/mL (ref 24.8–81.5)

## 2023-01-14 MED ORDER — MORPHINE SULFATE (CONCENTRATE) 10 MG/0.5ML PO SOLN
5.0000 mg | ORAL | Status: DC | PRN
  Administered 2023-01-14: 5 mg via ORAL
  Filled 2023-01-14: qty 0.5

## 2023-01-14 NOTE — TOC Transition Note (Signed)
Transition of Care [facility]) - CM/SW Discharge Note   Patient Details  Name: Kaylee [name] MRN: [id number] Date of Birth: 08-23-1940  Transition of Care [facility]) CM/SW Contact:  Allena Katz, LCSW Phone Number: 01/14/2023, 2:54 PM   Clinical Narrative:   DME to be delivered today. CSW has sent a message to [facility] to see the soonest they can get a caregiver out for discharge home with [facility].           Patient Goals and CMS Choice      Discharge Placement                         Discharge Plan and Services Additional resources added to the After Visit Summary for                                       Social Determinants of Health (SDOH) Interventions SDOH Screenings   Food Insecurity: No Food Insecurity (01/12/2023)  Housing: Low Risk  (01/12/2023)  Transportation Needs: No Transportation Needs (01/12/2023)  Utilities: Not At Risk (01/12/2023)  Tobacco Use: Low Risk  (01/12/2023)     Readmission Risk Interventions    12/21/2022   10:47 AM  Readmission Risk Prevention Plan  Post Dischage Appt Complete  Medication Screening Complete  Transportation Screening Complete

## 2023-01-14 NOTE — Progress Notes (Signed)
[facility] REFERRAL NOTE  Received request from Allena Katz, SW, Transitions of Care Manager, for hospice services at home after discharge. Spoke with Sharyne Richters, patient's son, to initiate education related to hospice philosophy, services, and team approach to care. Patient/family verbalized understanding of information given. Per discussion, the plan is for discharge home by EMS after DME delivery and hospital is ready to discharge home.  DME needs discussed. Patient has the following equipment in the home:  walker, [facility]  Patient/family requests the following equipment for delivery:  Hospital bed and over bed table                The address has been verified and is correct in the chart. [place]- patient's sister, is contact for DME delivery [phone] (lives with patient)  Please send signed and completed DNR home with patient/family if applicable. Please provide prescriptions at discharge as needed to ensure ongoing symptom management.  [facility] information and contact numbers given to [employer]. Above information shared with Allena Katz and medical care team at [facility].  Please call with any hospice related questions or concerns. Thank you for the opportunity to participate in this patient's care.  Norris Cross, RN Nurse Liaison [phone]

## 2023-01-14 NOTE — TOC Progression Note (Signed)
Transition of Care [facility]) - Progression Note    Patient Details  Name: Kaylee [name] MRN: [id number] Date of Birth: 1940-09-30  Transition of Care [facility]) CM/SW Contact  Allena Katz, LCSW Phone Number: 01/14/2023, 10:26 AM  Clinical Narrative:   CSW spoke with son to offer choice on hospice agencies. Son reports he has no preference referral give to [facility]. [facility] to assist at home. Abigail with [facility] notified. Ree Kida with [facility] notified. Son reported he would like a hospital bed for home. [facility] notified.          Expected Discharge Plan and Services                                               Social Determinants of Health (SDOH) Interventions SDOH Screenings   Food Insecurity: No Food Insecurity (01/12/2023)  Housing: Low Risk  (01/12/2023)  Transportation Needs: No Transportation Needs (01/12/2023)  Utilities: Not At Risk (01/12/2023)  Tobacco Use: Low Risk  (01/12/2023)    Readmission Risk Interventions    12/21/2022   10:47 AM  Readmission Risk Prevention Plan  Post Dischage Appt Complete  Medication Screening Complete  Transportation Screening Complete

## 2023-01-14 NOTE — Progress Notes (Signed)
SLP Cancellation Note  Patient Details Name: GLENDON FISER MRN: [id number] DOB: Nov 12, 1940   Cancelled treatment:       Reason Eval/Treat Not Completed:  (SLP consult d/c'd by provider due to change in GOC. SLP to sign off.)  Clyde Canterbury, M.S., CCC-SLP Speech-Language Pathologist [facility] [phone] Arnette Felts)  Woodroe Chen 01/14/2023, 9:12 AM

## 2023-01-14 NOTE — Assessment & Plan Note (Addendum)
Patient made comfort care measures.  Hospice to deliver equipment home.  Likely home with hospice tomorrow.  Patient also had recent stroke.

## 2023-01-14 NOTE — Progress Notes (Signed)
Progress Note   Patient: Kaylee [name] UJW:[id number] DOB: [birth year] DOA: 01/12/2023     2 DOS: the patient was seen and examined on 01/14/2023   Brief hospital course: 82 year old female past medical history of CVA, hypertension, untreated breast cancer presented with acute metabolic encephalopathy and found to have hypercalcemia.  Patient has not been walking around too much for the past week.  9/30.  Calcium still elevated at 11 and still with altered mental status will give a dose of Zometa.  MRI shows an acute/subacute nonhemorrhagic infarct involving the anterior left frontal lobe and left frontal operculum adjacent to the more remote infarct which is stable.  Made comfort care measures by palliative care. 10/1.  Patient will more talkative today but does not follow commands.  Able to move the right arm but not the right leg.  Hospice to deliver equipment home.  Assessment and Plan: * Acute ischemic left ACA stroke [facility]) Patient made comfort care measures.  Hospice to deliver equipment home.  Likely home with hospice tomorrow.  Patient also had recent stroke.  Hypercalcemia Calcium 13.6 on presentation with acute metabolic encephalopathy.  Calcium down to 11.0 with calcitonin and IV fluids.  Zometa given on 9/30.  Suspect secondary to metastatic breast cancer.  PTH normal.  Patient not eating and drinking very well and not moving well for the past week which could also be a cause of hypercalcemia.  No further lab draws since made comfort care.  Acute metabolic encephalopathy Likely secondary to hypercalcemia and/or stroke.  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  Patient will go home with hospice.        Subjective: Patient diagnosed with stroke and also hypercalcemia.  Made comfort care yesterday.  Hospice to deliver equipment  today and hopefully home tomorrow.  Physical Exam: Vitals:   01/13/23 0811 01/13/23 1154 01/13/23 1533 01/14/23 0933  BP: (!) 125/113 (!) 141/99 135/76 124/77  Pulse: 76 79 79 93  Resp: 17 16 16 16   Temp: (!) 97.5 F (36.4 C) (!) 97.5 F (36.4 C) 97.7 F (36.5 C) 98.6 F (37 C)  TempSrc:      SpO2: 98% 100% 100% 99%  Weight:      Height:       Physical Exam HENT:     Head: Normocephalic.  Eyes:     General: Lids are normal.     Conjunctiva/sclera: Conjunctivae normal.  Cardiovascular:     Rate and Rhythm: Normal rate and regular rhythm.     Heart sounds: Normal heart sounds, S1 normal and S2 normal.  Pulmonary:     Breath sounds: No decreased breath sounds, wheezing, rhonchi or rales.  Abdominal:     Palpations: Abdomen is soft.     Tenderness: There is no abdominal tenderness.  Musculoskeletal:     Right lower leg: No swelling.     Left lower leg: No swelling.  Skin:    General: Skin is warm.     Findings: No rash.  Neurological:     Mental Status: She is lethargic.     Comments: Unable to move her right leg for me.  Able to squeeze my hands bilaterally.     Data Reviewed: MRI confirms new stroke.  Family Communication: Spoke with son at the bedside  Disposition: Status is: Inpatient Remains inpatient appropriate because: Hospice to set up equipment today  and likely home with hospice tomorrow.  Planned Discharge Destination: Home with hospice    Time spent: 28 minutes  Author: Alford Highland, MD 01/14/2023 3:15 PM  For on call review [email or web address].

## 2023-01-14 NOTE — Progress Notes (Signed)
Palliative Care Progress Note, Assessment & Plan   Patient Name: Kaylee [name]       Date: 01/14/2023 DOB: [birth year]  Age: 82 y.o. MRN#: [id number] Attending Physician: Alford Highland, MD Primary Care Physician: Patient, No Pcp Per Admit Date: 01/12/2023  Subjective: Patient is lying in bed in no apparent distress.  She is sleeping with respirations even and unlabored.  She does not acknowledge my presence and does not make her wishes known.  No family present during my visit.  HPI: 82 y.o. female  with past medical history of UTI, CVA, HTN, and untreated cancer of right breast admitted on 01/12/2023 with weakness and confusion for 1 to 2 days.  Patient is being treated for encephalopathy and hypercalcemia.   PMT was consulted to discuss goals of care.  Summary of counseling/coordination of care: Extensive chart review completed prior to meeting patient including labs, vital signs, imaging, progress notes, orders, and available advanced directive documents from current and previous encounters.   After reviewing the patient's chart and assessing the patient at bedside, I spoke with attending in regards to plan of care.  As per attending, patient's son Trey Paula made aware of new stroke.  He would like for hospice to evaluate for possible inpatient placement.  I notified TOC and family to be offered choice of hospice agencies.  Full comfort measures continue. Awaiting hospice evaluation.   PMT will continue to follow and support patient and family throughout this hospitalization.  Physical Exam Constitutional:      General: She is not in acute distress.    Appearance: She is normal weight.  HENT:     Head: Normocephalic.     Nose: Nose normal.     Mouth/Throat:     Mouth: Mucous membranes are moist.   Cardiovascular:     Rate and Rhythm: Normal rate.  Pulmonary:     Effort: Pulmonary effort is normal.  Abdominal:     Palpations: Abdomen is soft.  Musculoskeletal:     Comments: Generalized weakness  Skin:    General: Skin is warm and dry.     Coloration: Skin is pale.  Psychiatric:        Behavior: Behavior normal.             Total Time 25 minutes   Time spent includes: Detailed review of medical records (labs, imaging, vital signs), medically appropriate exam (mental status, respiratory, cardiac, skin), discussed with treatment team, counseling and educating patient, family and staff, documenting clinical information, medication management and coordination of care.  Samara Deist L. Bonita Quin, DNP, FNP-BC Palliative Medicine Team

## 2023-01-15 DIAGNOSIS — I63522 Cerebral infarction due to unspecified occlusion or stenosis of left anterior cerebral artery: Secondary | ICD-10-CM | POA: Diagnosis not present

## 2023-01-15 MED ORDER — GLYCOPYRROLATE 1 MG PO TABS: 1.0000 mg | ORAL_TABLET | ORAL | 0 refills | Status: DC | PRN

## 2023-01-15 MED ORDER — ACETAMINOPHEN 325 MG PO TABS: 650.0000 mg | ORAL_TABLET | Freq: Four times a day (QID) | ORAL | Status: DC | PRN

## 2023-01-15 MED ORDER — POLYVINYL ALCOHOL 1.4 % OP SOLN: 1.0000 [drp] | Freq: Four times a day (QID) | OPHTHALMIC | 0 refills | Status: DC | PRN

## 2023-01-15 MED ORDER — ONDANSETRON 4 MG PO TBDP: 4.0000 mg | ORAL_TABLET | Freq: Four times a day (QID) | ORAL | 0 refills | Status: DC | PRN

## 2023-01-15 MED ORDER — HALOPERIDOL 0.5 MG PO TABS: 0.5000 mg | ORAL_TABLET | ORAL | 0 refills | Status: DC | PRN

## 2023-01-15 MED ORDER — BIOTENE DRY MOUTH MT LIQD: 15.0000 mL | OROMUCOSAL | 0 refills | Status: DC | PRN

## 2023-01-15 MED ORDER — MORPHINE SULFATE (CONCENTRATE) 10 MG/0.5ML PO SOLN: 5.0000 mg | ORAL | 0 refills | Status: DC | PRN

## 2023-01-15 NOTE — Progress Notes (Signed)
Palliative Care Progress Note   Patient Name: Kaylee [name]       Date: 01/15/2023 DOB: Jul 20, 1940  Age: 82 y.o. MRN#: [id number] Attending Physician: Sunnie Nielsen, DO Primary Care Physician: Patient, No Pcp Per Admit Date: 01/12/2023  Chart reviewed. Discharge summary in place. Plan to d/c today with hospice services.  No palliative needs at this time.   Thank you for allowing the Palliative Medicine Team to assist in the care of Kaylee [name].  Kaylee Deist L. Bonita Quin, DNP, FNP-BC Palliative Medicine Team  No charge

## 2023-01-15 NOTE — Care Management Important Message (Signed)
Important Message  Patient Details  Name: Kaylee [name] MRN: [id number] Date of Birth: [birth year]   Important Message Given:  Other (see comment)  On comfort care measures.  Medicare IM withheld at this time out of respect for patient and family.   Johnell Comings 01/15/2023, 8:25 AM

## 2023-01-15 NOTE — TOC Transition Note (Signed)
Transition of Care [facility]) - CM/SW Discharge Note   Patient Details  Name: Kaylee [name] MRN: [id number] Date of Birth: Nov 11, 1940  Transition of Care [facility]) CM/SW Contact:  Allena Katz, LCSW Phone Number: 01/15/2023, 8:55 AM   Clinical Narrative:   Pt to be discharged to home with [facility] hospice services all DME has been delivered per son. Son reports he will be at the hospital at 11 am and then we can call ems. Pt established with [facility] prior to admission and they were notified. CSW to call ems once son arrives. EMS forms on patients chart.     Final next level of care: Home w Hospice Care Barriers to Discharge: Barriers Resolved   Patient Goals and CMS Choice CMS Medicare.gov Compare Post Acute Care list provided to:: Patient    Discharge Placement                  Patient to be transferred to facility by: [facility] Name of family member notified: Son  Tinnie Gens Patient and family notified of of transfer: 01/15/23  Discharge Plan and Services Additional resources added to the After Visit Summary for                                       Social Determinants of Health (SDOH) Interventions SDOH Screenings   Food Insecurity: No Food Insecurity (01/12/2023)  Housing: Low Risk  (01/12/2023)  Transportation Needs: No Transportation Needs (01/12/2023)  Utilities: Not At Risk (01/12/2023)  Tobacco Use: Low Risk  (01/12/2023)     Readmission Risk Interventions    12/21/2022   10:47 AM  Readmission Risk Prevention Plan  Post Dischage Appt Complete  Medication Screening Complete  Transportation Screening Complete

## 2023-01-15 NOTE — Discharge Summary (Signed)
Physician Discharge Summary   Patient: Kaylee [name] MRN: [id number]  DOB: 1940-10-26   Admit:     Date of Admission: 01/12/2023 Admitted from: home   Discharge: Date of discharge: 01/15/23 Disposition:  home hospice  Condition at discharge: stable  CODE STATUS: DNR - signed goldenrod form was placed in chart 01/15/23      Discharge Physician: Sunnie Nielsen, DO [facility]     PCP: Patient, No Pcp Per  Recommendations for Outpatient Follow-up:  Per hospice        Discharge Diagnoses: Principal Problem:   Acute ischemic left ACA stroke [facility]) Active Problems:   Hypercalcemia   Acute metabolic encephalopathy   Essential hypertension   Weakness   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  Patient has not been walking around too much for the past week. 9/29: to ED, admitted to hospitalist service for hypercalcemia and AMS/encephalopathy 9/30.  Calcium still elevated at 11 and still with altered mental status gave a dose of Zometa.  MRI shows an acute/subacute nonhemorrhagic infarct involving the anterior left frontal lobe and left frontal operculum adjacent to the more remote infarct which is stable.  Transitioned to comfort care measures only  10/1.  Patient will more talkative today but does not follow commands.  Able to move the right arm but not the right leg.  Hospice to deliver equipment home. 10/2: confirmed home DME, patient stable for discharge home w/ hospice    Consultants:  Palliative care  Procedures/Surgeries: none      ASSESSMENT & PLAN:   Acute ischemic left ACA stroke [facility]) Patient made comfort care measures.   Hospice to deliver equipment home.    Hypercalcemia Calcium 13.6 on presentation with associated acute metabolic  encephalopathy.  Calcium down to 11.0 with calcitonin and IV fluids.  Zometa given on 9/30.  Suspect secondary to metastatic breast cancer.  PTH normal.  Patient not eating and drinking very well and not moving well for the past week which could also be a cause of hypercalcemia.  No further lab draws since comfort care only.   Acute metabolic encephalopathy Likely secondary to hypercalcemia and/or stroke.   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  Patient will go home with hospice.                Discharge Instructions  Allergies as of 01/15/2023   No Known Allergies      Medication List     STOP taking these medications    amLODipine 5 MG tablet Commonly known as: NORVASC   aspirin EC 81 MG tablet   atorvastatin 20 MG tablet Commonly known as: LIPITOR   feeding supplement Liqd   multivitamin with minerals Tabs tablet   Vitamin D3 50 MCG (2000 UT) capsule       TAKE these medications    acetaminophen 325 MG tablet Commonly known as: TYLENOL Take 2 tablets (650 mg total) by mouth every 6 (six) hours as needed for mild pain, moderate pain, fever or headache (or Fever >/= 101).   antiseptic oral rinse Liqd Apply 15 mLs topically as needed for dry mouth.   glycopyrrolate 1 MG tablet Commonly  known as: ROBINUL Take 1 tablet (1 mg total) by mouth every 4 (four) hours as needed (excessive secretions).   haloperidol 0.5 MG tablet Commonly known as: HALDOL Take 1 tablet (0.5 mg total) by mouth every 4 (four) hours as needed for agitation (or delirium).   morphine CONCENTRATE 10 MG/0.5ML Soln concentrated solution Take 0.25 mLs (5 mg total) by mouth every 2 (two) hours as needed for severe pain or shortness of breath.   ondansetron 4 MG disintegrating tablet Commonly known as: ZOFRAN-ODT Take 1 tablet (4 mg  total) by mouth every 6 (six) hours as needed for nausea.   polyvinyl alcohol 1.4 % ophthalmic solution Commonly known as: LIQUIFILM TEARS Place 1 drop into both eyes 4 (four) times daily as needed for dry eyes.          No Known Allergies   Subjective: pt alert, states she wants to go home, not answering other questions in any detail   Discharge Exam: BP 124/77 (BP Location: Left Arm)   Pulse 93   Temp 98.6 F (37 C)   Resp 16   Ht 5\' 2"  (1.575 m)   Wt 58.1 kg   SpO2 99%   BMI 23.41 kg/m  General: Pt is alert, awake, not in acute distress Cardiovascular: RRR, S1/S2 +, no rubs, no gallops Respiratory: CTA bilaterally, no wheezing, no rhonchi Abdominal: Soft, NT, ND Extremities: no edema, no cyanosis     The results of significant diagnostics from this hospitalization (including imaging, microbiology, ancillary and laboratory) are listed below for reference.     Microbiology: Recent Results (from the past 240 hour(s))  SARS Coronavirus 2 by RT PCR (hospital order, performed in [facility] hospital lab) *cepheid single result test* Anterior Nasal Swab     Status: None   Collection Time: 01/12/23 10:20 AM   Specimen: Anterior Nasal Swab  Result Value Ref Range Status   SARS Coronavirus 2 by RT PCR NEGATIVE NEGATIVE Final    Comment: (NOTE) SARS-CoV-2 target nucleic acids are NOT DETECTED.  The SARS-CoV-2 RNA is generally detectable in upper and lower respiratory specimens during the acute phase of infection. The lowest concentration of SARS-CoV-2 viral copies this assay can detect is 250 copies / mL. A negative result does not preclude SARS-CoV-2 infection and should not be used as the sole basis for treatment or other patient management decisions.  A negative result may occur with improper specimen collection / handling, submission of specimen other than nasopharyngeal swab, presence of viral mutation(s) within the areas targeted by this assay, and inadequate  number of viral copies (<250 copies / mL). A negative result must be combined with clinical observations, patient history, and epidemiological information.  Fact Sheet for Patients:   RoadLapTop.co.za  Fact Sheet for Healthcare Providers: http://kim-miller.com/  This test is not yet approved or  cleared by the [place] FDA and has been authorized for detection and/or diagnosis of SARS-CoV-2 by FDA under an Emergency Use Authorization (EUA).  This EUA will remain in effect (meaning this test can be used) for the duration of the COVID-19 declaration under Section 564(b)(1) of the Act, 21 U.S.C. section 360bbb-3(b)(1), unless the authorization is terminated or revoked sooner.  Performed at [facility], [address] [place], [place] [address]      Labs: BNP (last 3 results) No results for input(s): "BNP" in the last 8760 hours. Basic Metabolic Panel: Recent Labs  Lab 01/12/23 1112 01/12/23 1953 01/13/23 0444  NA 135 138 138  K 3.5 3.0* 3.4*  CL 97* 103 103  CO2 26 27 23   GLUCOSE 94 109* 92  BUN 25* 19 16  CREATININE 0.59 0.55 0.47  CALCIUM 13.6* 11.8*  12.1* 11.0*   Liver Function Tests: Recent Labs  Lab 01/12/23 1112 01/12/23 1953 01/13/23 0444  AST 79* 63* 58*  ALT 36 31 26  ALKPHOS 153* 128* 112  BILITOT 1.0 0.9 1.1  PROT 8.5* 7.5 6.7  ALBUMIN 2.3* 2.0* 1.9*   No results for input(s): "LIPASE", "AMYLASE" in the last 168 hours. No results for input(s): "AMMONIA" in the last 168 hours. CBC: Recent Labs  Lab 01/12/23 1020 01/13/23 0444  WBC 11.0* 12.6*  HGB 10.4* 8.9*  HCT 35.3* 28.1*  MCV 85.9 81.2  PLT 351 367   Cardiac Enzymes: No results for input(s): "CKTOTAL", "CKMB", "CKMBINDEX", "TROPONINI" in the last 168 hours. BNP: Invalid input(s): "POCBNP" CBG: No results for input(s): "GLUCAP" in the last 168 hours. D-Dimer No results for input(s): "DDIMER" in the last 72 hours. Hgb  A1c No results for input(s): "HGBA1C" in the last 72 hours. Lipid Profile No results for input(s): "CHOL", "HDL", "LDLCALC", "TRIG", "CHOLHDL", "LDLDIRECT" in the last 72 hours. Thyroid function studies No results for input(s): "TSH", "T4TOTAL", "T3FREE", "THYROIDAB" in the last 72 hours.  Invalid input(s): "FREET3" Anemia work up No results for input(s): "VITAMINB12", "FOLATE", "FERRITIN", "TIBC", "IRON", "RETICCTPCT" in the last 72 hours. Urinalysis    Component Value Date/Time   COLORURINE YELLOW (A) 01/13/2023 1200   APPEARANCEUR HAZY (A) 01/13/2023 1200   APPEARANCEUR Clear 11/08/2012 1147   LABSPEC 1.014 01/13/2023 1200   LABSPEC 1.021 11/08/2012 1147   PHURINE 5.0 01/13/2023 1200   GLUCOSEU NEGATIVE 01/13/2023 1200   GLUCOSEU Negative 11/08/2012 1147   HGBUR MODERATE (A) 01/13/2023 1200   BILIRUBINUR NEGATIVE 01/13/2023 1200   BILIRUBINUR Negative 11/08/2012 1147   KETONESUR 5 (A) 01/13/2023 1200   PROTEINUR NEGATIVE 01/13/2023 1200   NITRITE NEGATIVE 01/13/2023 1200   LEUKOCYTESUR SMALL (A) 01/13/2023 1200   LEUKOCYTESUR 3+ 11/08/2012 1147   Sepsis Labs Recent Labs  Lab 01/12/23 1020 01/13/23 0444  WBC 11.0* 12.6*   Microbiology Recent Results (from the past 240 hour(s))  SARS Coronavirus 2 by RT PCR (hospital order, performed in [facility] hospital lab) *cepheid single result test* Anterior Nasal Swab     Status: None   Collection Time: 01/12/23 10:20 AM   Specimen: Anterior Nasal Swab  Result Value Ref Range Status   SARS Coronavirus 2 by RT PCR NEGATIVE NEGATIVE Final    Comment: (NOTE) SARS-CoV-2 target nucleic acids are NOT DETECTED.  The SARS-CoV-2 RNA is generally detectable in upper and lower respiratory specimens during the acute phase of infection. The lowest concentration of SARS-CoV-2 viral copies this assay can detect is 250 copies / mL. A negative result does not preclude SARS-CoV-2 infection and should not be used as the sole basis for  treatment or other patient management decisions.  A negative result may occur with improper specimen collection / handling, submission of specimen other than nasopharyngeal swab, presence of viral mutation(s) within the areas targeted by this assay, and inadequate number of viral copies (<250 copies / mL). A negative result must be combined with clinical observations, patient history, and epidemiological information.  Fact Sheet for Patients:   RoadLapTop.co.za  Fact Sheet for Healthcare Providers: http://kim-miller.com/  This test is not yet approved or  cleared by the [place] FDA and has been authorized for detection and/or diagnosis  of SARS-CoV-2 by FDA under an Emergency Use Authorization (EUA).  This EUA will remain in effect (meaning this test can be used) for the duration of the COVID-19 declaration under Section 564(b)(1) of the Act, 21 U.S.C. section 360bbb-3(b)(1), unless the authorization is terminated or revoked sooner.  Performed at [facility], [address], [place], [place] [address]    Imaging MR BRAIN W WO CONTRAST  Result Date: 01/13/2023 CLINICAL DATA:  Untreated breast cancer. Encephalopathy. Increasing confusion over the last 2 days. EXAM: MRI HEAD WITHOUT AND WITH CONTRAST TECHNIQUE: Multiplanar, multiecho pulse sequences of the brain and surrounding structures were obtained without and with intravenous contrast. CONTRAST:  5mL GADAVIST GADOBUTROL 1 MMOL/ML IV SOLN COMPARISON:  CT head without contrast 01/12/2023. MR head without and with contrast 12/22/2022. FINDINGS: Brain: Acute/subacute nonhemorrhagic infarct is present in the anterior left frontal lobe and left frontal operculum adjacent to a more remote infarct. T1 shortening likely reflects early cortical laminar necrosis. The more remote hemorrhagic infarct of the medial left parietal lobe is stable. Diffuse subcortical white matter T2  hyperintensity is present in the posterior right frontal lobe. A remote posterior right frontal lobe cortical infarct is present. Remote infarcts are present in the left cerebellum. No acute hemorrhage or mass lesion is present. Moderate generalized atrophy is present. The ventricles are proportionate to the degree of atrophy. No significant extraaxial fluid collection is present. Postcontrast images demonstrate no pathologic enhancement. Vascular: Flow is present in the major intracranial arteries. Skull and upper cervical spine: The craniocervical junction is normal. Degenerative changes are present in the upper cervical spine. Marrow signal is unremarkable. Sinuses/Orbits: The paranasal sinuses and mastoid air cells are clear. The globes and orbits are within normal limits. IMPRESSION: 1. Acute/subacute nonhemorrhagic infarct involving the anterior left frontal lobe and left frontal operculum adjacent to a more remote infarct is stable. No significant interval progression or new infarct. 2. Remote hemorrhagic infarct of the medial left parietal lobe is stable. 3. Remote posterior right frontal lobe cortical infarct. 4. Remote infarcts of the left cerebellum. 5. Moderate generalized atrophy and white matter disease likely reflects the sequela of chronic microvascular ischemia. Electronically Signed   By: Marin Roberts M.D.   On: 01/13/2023 18:33   DG Chest Portable 1 View  Result Date: 01/12/2023 CLINICAL DATA:  Altered mental status EXAM: PORTABLE CHEST 1 VIEW COMPARISON:  None Available. FINDINGS: The heart size and mediastinal contours are within normal limits. Both lungs are clear. The visualized skeletal structures are unremarkable. Cardiac recording device noted. IMPRESSION: Normal mediastinum and cardiac silhouette. Normal pulmonary vasculature. No evidence of effusion, infiltrate, or pneumothorax. No acute bony abnormality. Electronically Signed   By: Genevive Bi M.D.   On: 01/12/2023 11:01    CT HEAD WO CONTRAST ( )  Result Date: 01/12/2023 CLINICAL DATA:  Altered mental status. EXAM: CT HEAD WITHOUT CONTRAST TECHNIQUE: Contiguous axial images were obtained from the base of the skull through the vertex without intravenous contrast. RADIATION DOSE REDUCTION: This exam was performed according to the departmental dose-optimization program which includes automated exposure control, adjustment of the mA and/or kV according to patient size and/or use of iterative reconstruction technique. COMPARISON:  CT head 12/21/2022 FINDINGS: Brain: Bifrontal infractions unchanged from comparison CT. No CT evidence of acute cortical infarction. No extra-axial fluid collections. No parenchymal hemorrhage. Generalized cortical atrophy and dilatation of the ventricles not changed from prior. Vascular: No hyperdense vessel or unexpected calcification. Skull: Normal. Negative for fracture or focal lesion. Sinuses/Orbits:  No acute finding. Other: Cerumen in the external auditory canals. IMPRESSION: 1. No acute intracranial findings. 2. Bifrontal infarcts unchanged from prior. 3. Generalized cortical atrophy and dilatation of the ventricles not changed from prior. Electronically Signed   By: Genevive Bi M.D.   On: 01/12/2023 10:59      Time coordinating discharge: over 30 minutes  SIGNED:  Sunnie Nielsen DO [facility]

## 2023-01-17 NOTE — Consult Note (Signed)
[facility] Customer service manager [facility]) Accountable Care Organization (ACO) [facility] Liaison Note  01/17/2023  DAESHA INSCO [birth year] [id number]  Location: [facility], [facility], [facility] RN Hospital Liaison screened the patient remotely at [facility].  Insurance:  Medicare   Candi L Slingerland is a 82 y.o. female who is a Primary Care Patient of Patient, No Pcp Per. The patient was screened for 7 and 30 day readmission hospitalization with noted medium risk score for unplanned readmission risk with 3 IP in 6 months.  The patient was assessed for potential [facility]) Care Management service needs for post hospital transition for care coordination. Review of patient's electronic medical record reveals patient was admitted for Hypercalcemia. Pt was discharged under the care of home with hospice care ([facility]). No further needs to address at this time.     [facility] Care Management/Population Health does not replace or interfere with any arrangements made by the Inpatient Transition of Care team.   For questions contact:   Elliot Cousin, RN, [facility] Liaison [place]   Population Health Office Hours MTWF  8:00 am-6:00 pm [phone] mobile [phone] [Office toll free line] Office Hours are M-F 8:30 - 5 pm [email or web address]

## 2023-02-14 DEATH — deceased
